# Patient Record
Sex: Female | Born: 1970 | Race: White | Hispanic: No | Marital: Married | Smoking: Never smoker
Health system: Southern US, Community
[De-identification: ages and names within clinical notes are randomized; demographics above are authoritative.]

## PROBLEM LIST (undated history)

## (undated) DIAGNOSIS — Z5189 Encounter for other specified aftercare: Secondary | ICD-10-CM

## (undated) DIAGNOSIS — E119 Type 2 diabetes mellitus without complications: Secondary | ICD-10-CM

## (undated) DIAGNOSIS — D649 Anemia, unspecified: Secondary | ICD-10-CM

## (undated) DIAGNOSIS — I10 Essential (primary) hypertension: Secondary | ICD-10-CM

## (undated) HISTORY — DX: Essential (primary) hypertension: I10

## (undated) HISTORY — DX: Encounter for other specified aftercare: Z51.89

## (undated) HISTORY — DX: Type 2 diabetes mellitus without complications: E11.9

## (undated) HISTORY — DX: Anemia, unspecified: D64.9

---

## 2001-11-12 DIAGNOSIS — E119 Type 2 diabetes mellitus without complications: Secondary | ICD-10-CM

## 2001-11-12 HISTORY — DX: Type 2 diabetes mellitus without complications: E11.9

## 2005-11-12 DIAGNOSIS — Z5189 Encounter for other specified aftercare: Secondary | ICD-10-CM

## 2005-11-12 HISTORY — DX: Encounter for other specified aftercare: Z51.89

## 2005-11-12 HISTORY — PX: LAPAROSCOPIC GASTRIC BYPASS: SUR771

## 2006-11-12 DIAGNOSIS — I10 Essential (primary) hypertension: Secondary | ICD-10-CM

## 2006-11-12 HISTORY — DX: Essential (primary) hypertension: I10

## 2012-03-24 ENCOUNTER — Ambulatory Visit (HOSPITAL_COMMUNITY): Payer: Self-pay

## 2012-08-29 ENCOUNTER — Telehealth (HOSPITAL_COMMUNITY): Payer: Self-pay | Admitting: Oncology

## 2012-09-19 ENCOUNTER — Encounter (HOSPITAL_COMMUNITY): Payer: Self-pay | Admitting: Oncology

## 2012-09-19 ENCOUNTER — Encounter (HOSPITAL_COMMUNITY): Payer: BC Managed Care – PPO | Attending: Oncology | Admitting: Oncology

## 2012-09-19 VITALS — BP 165/79 | HR 91 | Temp 97.3°F | Resp 20 | Ht 70.0 in | Wt 236.6 lb

## 2012-09-19 DIAGNOSIS — E538 Deficiency of other specified B group vitamins: Secondary | ICD-10-CM

## 2012-09-19 DIAGNOSIS — D509 Iron deficiency anemia, unspecified: Secondary | ICD-10-CM | POA: Insufficient documentation

## 2012-09-19 DIAGNOSIS — R5381 Other malaise: Secondary | ICD-10-CM

## 2012-09-19 DIAGNOSIS — Z9884 Bariatric surgery status: Secondary | ICD-10-CM

## 2012-09-19 DIAGNOSIS — E669 Obesity, unspecified: Secondary | ICD-10-CM | POA: Insufficient documentation

## 2012-09-19 LAB — CBC
HCT: 27.6 % — ABNORMAL LOW (ref 36.0–46.0)
Hemoglobin: 8.2 g/dL — ABNORMAL LOW (ref 12.0–15.0)
MCH: 21 pg — ABNORMAL LOW (ref 26.0–34.0)
MCHC: 29.7 g/dL — ABNORMAL LOW (ref 30.0–36.0)
MCV: 70.6 fL — ABNORMAL LOW (ref 78.0–100.0)
Platelets: 317 K/uL (ref 150–400)
RBC: 3.91 MIL/uL (ref 3.87–5.11)
RDW: 16.7 % — ABNORMAL HIGH (ref 11.5–15.5)
WBC: 8.4 K/uL (ref 4.0–10.5)

## 2012-09-19 NOTE — Progress Notes (Signed)
Problem #1 microcytic anemia in the setting of bariatric surgery in 2007 at [facility]. She has been at least iron deficient for year and a half without any success with oral iron both over-the-counter as well as prescription. They have failed to bring up her iron stores and her hemoglobin. She has chronic weakness and fatigue. She has no energy for exercising. Interestingly she does not crave ice nor does she have a sore tongue nor does she have trouble swallowing. She is not aware of blood in her stools etc. She is menstruating on a regular basis. Problem #2 obesity weighing 280 pounds at her maximum. 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 She is married for the second time has 2 sons one by a first marriage 1 by her second marriage. Both are in good health. She also is raising her granddaughter. She does not smoke. She does not drink. Both parents are still alive in good health. BP 165/79  Pulse 91  Temp 97.3 F (36.3 C) (Oral)  Resp 20  Ht 5\' 10"  (1.778 m)  Wt 236 lb 9.6 oz (107.321 kg)  BMI 33.95 kg/m2 She has no lymphadenopathy. She is alert and oriented. Facial symmetry is intact. She is quite pale. Nails are pale. Conjunctiva are pale. Throat is clear. She has an upper partial plate. Pupils are equally round and reactive to light. Lungs are clear. Heart shows a regular rhythm and rate without murmur rub or gallop. Breast exam is negative for masses. Abdomen is obese without obvious organomegaly in the midline incision is intact. 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 She has no leg edema no arm edema. Pulses are intact. This lady has at least severe iron deficiency anemia but I think with her Roux-en-Y procedure she needs to check for copper, B12, and folic acid. She may well have a mixed anemia. What is clear is that she has failed oral iron both conventional over-the-counter and prescription iron.  I think she needs a change of lifestyle  from her eating habits standpoint and I think she needs to get into some exercise program once her hemoglobin improves which I think  will be significantly improved in the near future. We will check her iron stores and CBC one month and 4 months after this iron infusion which will take place next Friday. She is willing to proceed. She has also not had a Pap smear since the birth of her son 16 years ago and we will refer her to Dr. Christin Bach.

## 2012-09-19 NOTE — Progress Notes (Signed)
Honor Norma Carroll presented for labwork. Labs per MD order drawn via Peripheral Line 23 gauge needle inserted in left antecubital.  Good blood return present. Procedure without incident.  Needle removed intact. Patient tolerated procedure well.

## 2012-09-19 NOTE — Patient Instructions (Addendum)
[facility]  Discharge Instructions  RECOMMENDATIONS MADE BY THE CONSULTANT AND ANY TEST RESULTS WILL BE SENT TO YOUR REFERRING DOCTOR.   Lab work today. Feraheme 1020 mg infusion next week. Lab work mid December following iron infusion.  Lab work again in March 2014. MD appointment after lab work in March 2014. We will make a referral to Dr.Ferguson's office for GYN follow-up.   I acknowledge that I have been informed and understand all the instructions given to me and received a copy. I do not have any more questions at this time, but understand that I may call the Specialty Clinic at [facility] at [phone] during business hours should I have any further questions or need assistance in obtaining follow-up care.    __________________________________________  _____________  __________ Signature of Patient or Authorized Representative            Date                   Time    __________________________________________ Nurse's Signature

## 2012-09-20 LAB — IRON AND TIBC: UIBC: 356 ug/dL (ref 125–400)

## 2012-09-20 LAB — FERRITIN: Ferritin: 2 ng/mL — ABNORMAL LOW (ref 10–291)

## 2012-09-22 ENCOUNTER — Other Ambulatory Visit (HOSPITAL_COMMUNITY): Payer: Self-pay | Admitting: Oncology

## 2012-09-22 DIAGNOSIS — E538 Deficiency of other specified B group vitamins: Secondary | ICD-10-CM

## 2012-09-22 LAB — COPPER, SERUM: Copper: 108 ug/dL (ref 70–175)

## 2012-09-22 NOTE — Progress Notes (Signed)
Patient also has Vitamin B12 deficiency due to her Bypass surgery as well

## 2012-09-23 LAB — FOLATE: Folate: >24 ng/mL (ref >5.4)

## 2012-09-26 ENCOUNTER — Encounter (HOSPITAL_BASED_OUTPATIENT_CLINIC_OR_DEPARTMENT_OTHER): Payer: BC Managed Care – PPO

## 2012-09-26 VITALS — BP 133/80 | HR 85 | Temp 98.3°F | Resp 16

## 2012-09-26 DIAGNOSIS — E538 Deficiency of other specified B group vitamins: Secondary | ICD-10-CM

## 2012-09-26 DIAGNOSIS — D509 Iron deficiency anemia, unspecified: Secondary | ICD-10-CM

## 2012-09-26 MED ORDER — SODIUM CHLORIDE 0.9 % IV SOLN
INTRAVENOUS | Status: DC
  Administered 2012-09-26: 14:00:00 via INTRAVENOUS

## 2012-09-26 MED ORDER — CYANOCOBALAMIN 1000 MCG/ML IJ SOLN
1000.0000 ug | Freq: Once | INTRAMUSCULAR | Status: AC
  Administered 2012-09-26: 1000 ug via SUBCUTANEOUS

## 2012-09-26 MED ORDER — SODIUM CHLORIDE 0.9 % IJ SOLN
10.0000 mL | INTRAMUSCULAR | Status: DC | PRN
  Administered 2012-09-26: 10 mL via INTRAVENOUS
  Filled 2012-09-26: qty 10

## 2012-09-26 MED ORDER — CYANOCOBALAMIN 1000 MCG/ML IJ SOLN
INTRAMUSCULAR | Status: AC
  Filled 2012-09-26: qty 1

## 2012-09-26 MED ORDER — FERUMOXYTOL INJECTION 510 MG/17 ML
1020.0000 mg | Freq: Once | INTRAVENOUS | Status: AC
  Administered 2012-09-26: 1020 mg via INTRAVENOUS
  Filled 2012-09-26: qty 34

## 2012-09-26 MED ORDER — SODIUM CHLORIDE 0.9 % IJ SOLN
INTRAMUSCULAR | Status: AC
  Filled 2012-09-26: qty 20

## 2012-09-26 NOTE — Progress Notes (Signed)
Tolerated infusion well. 

## 2012-10-31 ENCOUNTER — Other Ambulatory Visit (HOSPITAL_COMMUNITY): Payer: BC Managed Care – PPO

## 2013-01-30 ENCOUNTER — Other Ambulatory Visit (HOSPITAL_COMMUNITY): Payer: BC Managed Care – PPO

## 2013-02-03 ENCOUNTER — Ambulatory Visit (HOSPITAL_COMMUNITY): Payer: BC Managed Care – PPO | Admitting: Oncology

## 2016-07-16 ENCOUNTER — Emergency Department (HOSPITAL_COMMUNITY): Payer: 59

## 2016-07-16 ENCOUNTER — Inpatient Hospital Stay (HOSPITAL_COMMUNITY)
Admission: EM | Admit: 2016-07-16 | Discharge: 2016-07-19 | DRG: 373 | Disposition: A | Discharge status: Home / Self Care | Payer: 59 | Attending: Internal Medicine | Admitting: Internal Medicine

## 2016-07-16 ENCOUNTER — Encounter (HOSPITAL_COMMUNITY): Payer: Self-pay | Admitting: Emergency Medicine

## 2016-07-16 DIAGNOSIS — Z87891 Personal history of nicotine dependence: Secondary | ICD-10-CM | POA: Diagnosis not present

## 2016-07-16 DIAGNOSIS — N6320 Unspecified lump in the left breast, unspecified quadrant: Secondary | ICD-10-CM

## 2016-07-16 DIAGNOSIS — Z7984 Long term (current) use of oral hypoglycemic drugs: Secondary | ICD-10-CM

## 2016-07-16 DIAGNOSIS — K3532 Acute appendicitis with perforation and localized peritonitis, without abscess: Secondary | ICD-10-CM | POA: Diagnosis present

## 2016-07-16 DIAGNOSIS — N63 Unspecified lump in breast: Secondary | ICD-10-CM | POA: Diagnosis present

## 2016-07-16 DIAGNOSIS — E538 Deficiency of other specified B group vitamins: Secondary | ICD-10-CM | POA: Diagnosis present

## 2016-07-16 DIAGNOSIS — E876 Hypokalemia: Secondary | ICD-10-CM | POA: Diagnosis present

## 2016-07-16 DIAGNOSIS — Z9884 Bariatric surgery status: Secondary | ICD-10-CM

## 2016-07-16 DIAGNOSIS — K352 Acute appendicitis with generalized peritonitis: Secondary | ICD-10-CM | POA: Diagnosis present

## 2016-07-16 DIAGNOSIS — E119 Type 2 diabetes mellitus without complications: Secondary | ICD-10-CM | POA: Diagnosis present

## 2016-07-16 DIAGNOSIS — N920 Excessive and frequent menstruation with regular cycle: Secondary | ICD-10-CM | POA: Diagnosis present

## 2016-07-16 DIAGNOSIS — D500 Iron deficiency anemia secondary to blood loss (chronic): Secondary | ICD-10-CM | POA: Diagnosis present

## 2016-07-16 DIAGNOSIS — Z8249 Family history of ischemic heart disease and other diseases of the circulatory system: Secondary | ICD-10-CM

## 2016-07-16 DIAGNOSIS — K567 Ileus, unspecified: Secondary | ICD-10-CM

## 2016-07-16 DIAGNOSIS — I10 Essential (primary) hypertension: Secondary | ICD-10-CM | POA: Diagnosis present

## 2016-07-16 DIAGNOSIS — E118 Type 2 diabetes mellitus with unspecified complications: Secondary | ICD-10-CM | POA: Diagnosis not present

## 2016-07-16 DIAGNOSIS — Z833 Family history of diabetes mellitus: Secondary | ICD-10-CM | POA: Diagnosis not present

## 2016-07-16 DIAGNOSIS — N649 Disorder of breast, unspecified: Secondary | ICD-10-CM

## 2016-07-16 LAB — COMPREHENSIVE METABOLIC PANEL
ALK PHOS: 61 U/L (ref 38–126)
ALT: 9 U/L — ABNORMAL LOW (ref 14–54)
ANION GAP: 11 (ref 5–15)
AST: 14 U/L — ABNORMAL LOW (ref 15–41)
Albumin: 3.9 g/dL (ref 3.5–5.0)
BILIRUBIN TOTAL: 0.8 mg/dL (ref 0.3–1.2)
BUN: 17 mg/dL (ref 6–20)
CALCIUM: 8.9 mg/dL (ref 8.9–10.3)
CO2: 22 mmol/L (ref 22–32)
Chloride: 103 mmol/L (ref 101–111)
Creatinine, Ser: 0.66 mg/dL (ref 0.44–1.00)
Glucose, Bld: 134 mg/dL — ABNORMAL HIGH (ref 65–99)
POTASSIUM: 3.2 mmol/L — AB (ref 3.5–5.1)
Sodium: 136 mmol/L (ref 135–145)
TOTAL PROTEIN: 7.5 g/dL (ref 6.5–8.1)

## 2016-07-16 LAB — URINALYSIS, ROUTINE W REFLEX MICROSCOPIC
BILIRUBIN URINE: NEGATIVE
Glucose, UA: NEGATIVE mg/dL
Ketones, ur: NEGATIVE mg/dL
Leukocytes, UA: NEGATIVE
NITRITE: POSITIVE — AB
pH: 5 (ref 5.0–8.0)

## 2016-07-16 LAB — CBC
HEMATOCRIT: 25.4 % — AB (ref 36.0–46.0)
HEMOGLOBIN: 7.4 g/dL — AB (ref 12.0–15.0)
MCH: 20.3 pg — ABNORMAL LOW (ref 26.0–34.0)
MCHC: 29.1 g/dL — ABNORMAL LOW (ref 30.0–36.0)
MCV: 69.8 fL — AB (ref 78.0–100.0)
Platelets: 250 10*3/uL (ref 150–400)
RBC: 3.64 MIL/uL — AB (ref 3.87–5.11)
RDW: 16.9 % — ABNORMAL HIGH (ref 11.5–15.5)
WBC: 11.7 10*3/uL — AB (ref 4.0–10.5)

## 2016-07-16 LAB — CBC WITH DIFFERENTIAL/PLATELET
BASOS ABS: 0 10*3/uL (ref 0.0–0.1)
BASOS PCT: 0 %
EOS ABS: 0 10*3/uL (ref 0.0–0.7)
Eosinophils Relative: 0 %
HCT: 28.2 % — ABNORMAL LOW (ref 36.0–46.0)
HEMOGLOBIN: 8.1 g/dL — AB (ref 12.0–15.0)
Lymphocytes Relative: 4 %
Lymphs Abs: 0.7 10*3/uL (ref 0.7–4.0)
MCH: 20 pg — AB (ref 26.0–34.0)
MCHC: 28.7 g/dL — AB (ref 30.0–36.0)
MCV: 69.8 fL — ABNORMAL LOW (ref 78.0–100.0)
Monocytes Absolute: 0.7 10*3/uL (ref 0.1–1.0)
Monocytes Relative: 4 %
NEUTROS ABS: 14.9 10*3/uL — AB (ref 1.7–7.7)
NEUTROS PCT: 92 %
PLATELETS: 300 10*3/uL (ref 150–400)
RBC: 4.04 MIL/uL (ref 3.87–5.11)
RDW: 16.8 % — ABNORMAL HIGH (ref 11.5–15.5)
WBC: 16.3 10*3/uL — ABNORMAL HIGH (ref 4.0–10.5)

## 2016-07-16 LAB — URINE MICROSCOPIC-ADD ON

## 2016-07-16 LAB — CREATININE, SERUM: Creatinine, Ser: 0.7 mg/dL (ref 0.44–1.00)

## 2016-07-16 LAB — LIPASE, BLOOD: LIPASE: 21 U/L (ref 11–51)

## 2016-07-16 LAB — LACTIC ACID, PLASMA
Lactic Acid, Venous: 1.3 mmol/L (ref 0.5–1.9)
Lactic Acid, Venous: 1.2 mmol/L (ref 0.5–1.9)

## 2016-07-16 LAB — TSH: TSH: 0.392 u[IU]/mL (ref 0.350–4.500)

## 2016-07-16 LAB — PREGNANCY, URINE: PREG TEST UR: NEGATIVE

## 2016-07-16 MED ORDER — BISACODYL 10 MG RE SUPP: 10.0000 mg | Freq: Every day | RECTAL | Status: DC | PRN

## 2016-07-16 MED ORDER — SODIUM CHLORIDE 0.9% FLUSH
3.0000 mL | Freq: Two times a day (BID) | INTRAVENOUS | Status: DC
  Administered 2016-07-16: 3 mL via INTRAVENOUS

## 2016-07-16 MED ORDER — MORPHINE SULFATE (PF) 4 MG/ML IV SOLN
4.0000 mg | INTRAVENOUS | Status: AC | PRN
  Administered 2016-07-16 (×2): 4 mg via INTRAVENOUS
  Filled 2016-07-16 (×2): qty 1

## 2016-07-16 MED ORDER — ALBUTEROL SULFATE (2.5 MG/3ML) 0.083% IN NEBU: 2.5000 mg | INHALATION_SOLUTION | RESPIRATORY_TRACT | Status: DC | PRN

## 2016-07-16 MED ORDER — INSULIN ASPART 100 UNIT/ML ~~LOC~~ SOLN: 1.0000 [IU] | Freq: Once | SUBCUTANEOUS | Status: DC

## 2016-07-16 MED ORDER — ACETAMINOPHEN 325 MG PO TABS
650.0000 mg | ORAL_TABLET | Freq: Four times a day (QID) | ORAL | Status: DC | PRN
  Administered 2016-07-16 – 2016-07-17 (×3): 650 mg via ORAL
  Filled 2016-07-16 (×3): qty 2

## 2016-07-16 MED ORDER — TRAZODONE HCL 50 MG PO TABS
50.0000 mg | ORAL_TABLET | Freq: Every evening | ORAL | Status: DC | PRN
  Administered 2016-07-18: 50 mg via ORAL
  Filled 2016-07-16 (×2): qty 1

## 2016-07-16 MED ORDER — HYDRALAZINE HCL 20 MG/ML IJ SOLN: 10.0000 mg | Freq: Four times a day (QID) | INTRAMUSCULAR | Status: DC | PRN

## 2016-07-16 MED ORDER — HEPARIN SODIUM (PORCINE) 5000 UNIT/ML IJ SOLN: 5000.0000 [IU] | Freq: Three times a day (TID) | INTRAMUSCULAR | Status: DC

## 2016-07-16 MED ORDER — MORPHINE SULFATE (PF) 4 MG/ML IV SOLN
4.0000 mg | INTRAVENOUS | Status: DC | PRN
  Administered 2016-07-16 – 2016-07-17 (×3): 4 mg via INTRAVENOUS
  Filled 2016-07-16 (×3): qty 1

## 2016-07-16 MED ORDER — SODIUM CHLORIDE 0.9 % IV BOLUS (SEPSIS)
1000.0000 mL | Freq: Once | INTRAVENOUS | Status: AC
  Administered 2016-07-16: 1000 mL via INTRAVENOUS

## 2016-07-16 MED ORDER — ONDANSETRON HCL 4 MG/2ML IJ SOLN
4.0000 mg | Freq: Four times a day (QID) | INTRAMUSCULAR | Status: DC | PRN
  Administered 2016-07-16 – 2016-07-17 (×2): 4 mg via INTRAVENOUS
  Filled 2016-07-16 (×2): qty 2

## 2016-07-16 MED ORDER — ONDANSETRON HCL 4 MG PO TABS: 4.0000 mg | ORAL_TABLET | Freq: Four times a day (QID) | ORAL | Status: DC | PRN

## 2016-07-16 MED ORDER — PIPERACILLIN-TAZOBACTAM 3.375 G IVPB
3.3750 g | Freq: Three times a day (TID) | INTRAVENOUS | Status: DC
  Administered 2016-07-16 – 2016-07-19 (×8): 3.375 g via INTRAVENOUS
  Filled 2016-07-16 (×7): qty 50

## 2016-07-16 MED ORDER — SODIUM CHLORIDE 0.9% FLUSH: 3.0000 mL | INTRAVENOUS | Status: DC | PRN

## 2016-07-16 MED ORDER — IOPAMIDOL (ISOVUE-300) INJECTION 61%
100.0000 mL | Freq: Once | INTRAVENOUS | Status: AC | PRN
  Administered 2016-07-16: 100 mL via INTRAVENOUS

## 2016-07-16 MED ORDER — SODIUM CHLORIDE 0.9 % IV SOLN
INTRAVENOUS | Status: DC
  Administered 2016-07-16 – 2016-07-17 (×2): via INTRAVENOUS

## 2016-07-16 MED ORDER — ACETAMINOPHEN 650 MG RE SUPP: 650.0000 mg | Freq: Four times a day (QID) | RECTAL | Status: DC | PRN

## 2016-07-16 MED ORDER — FLEET ENEMA 7-19 GM/118ML RE ENEM: 1.0000 | ENEMA | Freq: Once | RECTAL | Status: DC | PRN

## 2016-07-16 MED ORDER — PIPERACILLIN-TAZOBACTAM 3.375 G IVPB 30 MIN
3.3750 g | Freq: Once | INTRAVENOUS | Status: AC
  Administered 2016-07-16: 3.375 g via INTRAVENOUS
  Filled 2016-07-16: qty 50

## 2016-07-16 MED ORDER — SODIUM CHLORIDE 0.9 % IV SOLN: 250.0000 mL | INTRAVENOUS | Status: DC | PRN

## 2016-07-16 MED ORDER — ONDANSETRON HCL 4 MG/2ML IJ SOLN
4.0000 mg | INTRAMUSCULAR | Status: AC | PRN
  Administered 2016-07-16 (×2): 4 mg via INTRAVENOUS
  Filled 2016-07-16 (×2): qty 2

## 2016-07-16 MED ORDER — SODIUM CHLORIDE 0.9 % IV SOLN
INTRAVENOUS | Status: DC
  Administered 2016-07-16: 16:00:00 via INTRAVENOUS

## 2016-07-16 NOTE — ED Notes (Signed)
Dr Clarene Dukemcmanus notified of fever of 102.9 per Dr Clarene DukeMcmanus blood cultures to be obtained. No new orders

## 2016-07-16 NOTE — ED Notes (Signed)
Pt returned from CT °

## 2016-07-16 NOTE — H&P (Signed)
Patient Demographics:    Norma Carroll, is a 45 y.o. female  MRN: [id number]   DOB - 10-04-1971  Admit Date - 07/16/2016  Outpatient Primary MD for the patient is Erasmo Downer, NP   Assessment & Plan:    Principal Problem:   Acute perforated appendicitis Active Problems:   Vitamin B12 deficiency   Diabetes (HCC)   HTN (hypertension)   Breast mass, left/CT abd from 07/16/16    1)Acute Perforated Appendix- Discussed with Dr. Franky Macho the on-call surgeon, he advised IV Zosyn/conservative management for now, official neurosurgical consult pending. Dr. Franky Macho' input appreciated. Patient will be nothing by mouth for now, morphine sulfate when necessary for pain, Zofran when necessary for nausea vomiting. Blood cultures pending.   2)Lt Breast Lesion- patient and her husband notified of incidental finding of left lower breast lesion on CT abdomen and pelvis, advised to have a mammogram as outpatient within the next month. Patient and her husband verbalized understanding, RN Victorino Dike present  3)DM- hold metformin due to contrast study, Allow some permissive Hyperglycemia rather than risk life-threatening hypoglycemia in a patient with unreliable oral intake. Use Novolog/Humalog Sliding scale insulin with Accu-Cheks/Fingersticks as ordered   4)HTN- hold lisinopril onto patient is euvolemic,   may use IV Hydralazine 10 mg  Every 4 hours Prn for systolic blood pressure over 160 mmhg  5)FEN-nothing by mouth until seen by general surgeon, consider NG tube due to ileus, hydrate IV with normal saline  6)Anemia- Hemoglobin is down to 8.1 prior to hydration, Patient with long-standing history of B12 deficiency secondary to gastric bypass, however MCV and MCH have been persistently low suggesting some degree of iron  deficiency anemia ,Patient reports menorrhagia with clots, LMP was 07/09/2016 it was heavy with clots. Advised outpatient pelvic ultrasound  check stool occult blood, transfuse prn , iron studies, B12 and Folic acid level are pending.   7)Sepsis- secondary to #1 above, with leukocytosis, fevers, IV Zosyn as above in #1 pending cultures, hydrate IV, lactic acid level pending  With History of - Reviewed by me  Past Medical History:  Diagnosis Date  . Anemia   . Blood transfusion without reported diagnosis 2007   after gastric bypass surgery  . Diabetes mellitus without complication (HCC) 2003  . Hypertension 2008      Past Surgical History:  Procedure Laterality Date  . LAPAROSCOPIC GASTRIC BYPASS  2007      Chief Complaint  Patient presents with  . Abdominal Pain  . Fever      HPI:    Norma Carroll  is a 45 y.o. female, With past medical history relevant for hypertension, diabetes and previous gastric bypass surgery who presents with abdominal pain that started in the early hours of 07/14/2016, abdominal pain was associated with nausea but no emesis, patient has had chills and fevers, no diarrhea. Fevers up to 102.9 the ED, No sick contacts at home ,no flank pain no  dysuria. In ED patient was found to have perforated appendicitis on the CT scan with leukocytosis with anemia. LMP was 07/09/2016 it was heavy with clots (menorrhagia)    Review of systems:    In addition to the HPI above,   A full 12 point Review of 10 Systems was done, except as stated above, all other Review of 10 Systems were negative.    Social History:  Reviewed by me    Social History  Substance Use Topics  . Smoking status: Former Games developermoker  . Smokeless tobacco: Never Used  . Alcohol use No       Family History :  Reviewed by me    Family History  Problem Relation Age of Onset  . Heart attack Father   . Hypertension Father   . Diabetes Father      Home Medications:   Prior to Admission  medications   Medication Sig Start Date End Date Taking? Authorizing Provider  acetaminophen (TYLENOL) 500 MG tablet Take 500 mg by mouth every 6 (six) hours as needed for mild pain, moderate pain or headache.   Yes Historical Provider, MD  lisinopril (PRINIVIL,ZESTRIL) 20 MG tablet Take 20 mg by mouth daily.   Yes Historical Provider, MD  metFORMIN (GLUCOPHAGE) 500 MG tablet Take 1,000 mg by mouth daily with breakfast. Take two 500 mg tablets every am   Yes Historical Provider, MD     Allergies:    No Known Allergies   Physical Exam:   Vitals  Blood pressure 135/61, pulse 96, temperature (!) 100.7 F (38.2 C), temperature source Oral, resp. rate 18, height 5\' 10"  (1.778 m), weight 88.5 kg (195 lb), last menstrual period 07/09/2016, SpO2 97 %.  Physical Examination: General appearance - alert, well appearing, and in no distress  Mental status - alert, oriented to person, place, and time,  Eyes - sclera anicteric Neck - supple, no JVD elevation , Chest - clear  to auscultation bilaterally, symmetrical air movement,  Heart - S1 and S2 normal,  Abdomen - soft, generalized tenderness with voluntary guarding and slight rebound, nondistended,  Neurological - screening mental status exam normal, neck supple without rigidity, cranial nerves II through XII intact, DTR's normal and symmetric Extremities - no pedal edema noted, intact peripheral pulses  Skin - warm, dry    Data Review:    CBC  Recent Labs Lab 07/16/16 1535  WBC 16.3*  HGB 8.1*  HCT 28.2*  PLT 300  MCV 69.8*  MCH 20.0*  MCHC 28.7*  RDW 16.8*  LYMPHSABS 0.7  MONOABS 0.7  EOSABS 0.0  BASOSABS 0.0   ------------------------------------------------------------------------------------------------------------------  Chemistries   Recent Labs Lab 07/16/16 1535  NA 136  K 3.2*  CL 103  CO2 22  GLUCOSE 134*  BUN 17  CREATININE 0.66  CALCIUM 8.9  AST 14*  ALT 9*  ALKPHOS 61  BILITOT 0.8    ------------------------------------------------------------------------------------------------------------------ estimated creatinine clearance is 107.2 mL/min (by C-G formula based on SCr of 0.8 mg/dL). ------------------------------------------------------------------------------------------------------------------ No results for input(s): TSH, T4TOTAL, T3FREE, THYROIDAB in the last 72 hours.  Invalid input(s): FREET3   Coagulation profile No results for input(s): INR, PROTIME in the last 168 hours. ------------------------------------------------------------------------------------------------------------------- No results for input(s): DDIMER in the last 72 hours. -------------------------------------------------------------------------------------------------------------------  Cardiac Enzymes No results for input(s): CKMB, TROPONINI, MYOGLOBIN in the last 168 hours.  Invalid input(s): CK ------------------------------------------------------------------------------------------------------------------ No results found for: BNP   ---------------------------------------------------------------------------------------------------------------  Urinalysis    Component Value Date/Time   COLORURINE YELLOW 07/16/2016 1510  APPEARANCEUR CLEAR 07/16/2016 1510   LABSPEC >1.030 (H) 07/16/2016 1510   PHURINE 5.0 07/16/2016 1510   GLUCOSEU NEGATIVE 07/16/2016 1510   HGBUR TRACE (A) 07/16/2016 1510   BILIRUBINUR NEGATIVE 07/16/2016 1510   KETONESUR NEGATIVE 07/16/2016 1510   PROTEINUR TRACE (A) 07/16/2016 1510   NITRITE POSITIVE (A) 07/16/2016 1510   LEUKOCYTESUR NEGATIVE 07/16/2016 1510    ----------------------------------------------------------------------------------------------------------------   Imaging Results:    Dg Chest 2 View  Result Date: 07/16/2016 CLINICAL DATA:  Abdominal pain and chills. EXAM: CHEST  2 VIEW COMPARISON:  None. FINDINGS: The heart size and  mediastinal contours are within normal limits. Both lungs are clear. The visualized skeletal structures are unremarkable. IMPRESSION: No active cardiopulmonary disease. Electronically Signed   By: Gerome Sam III M.D   On: 07/16/2016 16:23   Ct Abdomen Pelvis W Contrast  Result Date: 07/16/2016 CLINICAL DATA:  Diffuse abdominal pain and loose stool since Saturday. EXAM: CT ABDOMEN AND PELVIS WITH CONTRAST TECHNIQUE: Multidetector CT imaging of the abdomen and pelvis was performed using the standard protocol following bolus administration of intravenous contrast. CONTRAST:  ISOVUE-300 IOPAMIDOL (ISOVUE-300) INJECTION 61% COMPARISON:  None. FINDINGS: Lower chest: The lung bases are clear. No pulmonary nodules or pleural effusion. The heart is normal in size. No pericardial effusion. The distal esophagus is grossly normal. Hepatobiliary: No focal hepatic lesions or intrahepatic biliary dilatation. Mild gallbladder distention. No gallbladder wall thickening or pericholecystic inflammatory change to suggest acute cholecystitis. No common bile duct dilatation. Pancreas: No mass, inflammation or ductal dilatation. Spleen: Normal size.  No focal lesions. Adrenals/Urinary Tract: The adrenal glands and kidneys are unremarkable except for small lower pole calculi. No obstructing ureteral calculi or bladder calculi. Stomach/Bowel: Surgical changes from gastric bypass surgery. No complicating features are demonstrated. The small bowel is unremarkable. The terminal ileum is normal. There is moderate fluid in the right colon. The left colon is relatively decompressed. The appendix is markedly distended with wall thickening and periappendiceal inflammatory change. Extra luminal gas is seen. The tip of the appendix is normal. Findings most consistent with contained perforated appendicitis. Vascular/Lymphatic: No mesenteric or retroperitoneal mass or adenopathy. The aorta and branch vessels are patent. The major venous  structures are patent. Reproductive: The uterus and ovaries are unremarkable. Other: Small to moderate amount of free pelvic fluid. Musculoskeletal: No significant bony findings. IMPRESSION: 1. CT findings consistent with perforated appendicitis. Probable associated mild ileus. 2. Status post gastric bypass surgery. No complicating features are demonstrated. 3. Small bilateral renal calculi but no obstructing ureteral calculi. 4. Possible left lower breast lesion. **An incidental finding of potential clinical significance has been found. Recommend correlation with physical examination and any prior recent mammograms or ultrasound. Patient may need a diagnostic breast imaging workup.** Electronically Signed   By: Rudie Meyer M.D.   On: 07/16/2016 16:57    Radiological Exams on Admission: Dg Chest 2 View  Result Date: 07/16/2016 CLINICAL DATA:  Abdominal pain and chills. EXAM: CHEST  2 VIEW COMPARISON:  None. FINDINGS: The heart size and mediastinal contours are within normal limits. Both lungs are clear. The visualized skeletal structures are unremarkable. IMPRESSION: No active cardiopulmonary disease. Electronically Signed   By: Gerome Sam III M.D   On: 07/16/2016 16:23   Ct Abdomen Pelvis W Contrast  Result Date: 07/16/2016 CLINICAL DATA:  Diffuse abdominal pain and loose stool since Saturday. EXAM: CT ABDOMEN AND PELVIS WITH CONTRAST TECHNIQUE: Multidetector CT imaging of the abdomen and pelvis was performed using the standard  protocol following bolus administration of intravenous contrast. CONTRAST:  ISOVUE-300 IOPAMIDOL (ISOVUE-300) INJECTION 61% COMPARISON:  None. FINDINGS: Lower chest: The lung bases are clear. No pulmonary nodules or pleural effusion. The heart is normal in size. No pericardial effusion. The distal esophagus is grossly normal. Hepatobiliary: No focal hepatic lesions or intrahepatic biliary dilatation. Mild gallbladder distention. No gallbladder wall thickening or  pericholecystic inflammatory change to suggest acute cholecystitis. No common bile duct dilatation. Pancreas: No mass, inflammation or ductal dilatation. Spleen: Normal size.  No focal lesions. Adrenals/Urinary Tract: The adrenal glands and kidneys are unremarkable except for small lower pole calculi. No obstructing ureteral calculi or bladder calculi. Stomach/Bowel: Surgical changes from gastric bypass surgery. No complicating features are demonstrated. The small bowel is unremarkable. The terminal ileum is normal. There is moderate fluid in the right colon. The left colon is relatively decompressed. The appendix is markedly distended with wall thickening and periappendiceal inflammatory change. Extra luminal gas is seen. The tip of the appendix is normal. Findings most consistent with contained perforated appendicitis. Vascular/Lymphatic: No mesenteric or retroperitoneal mass or adenopathy. The aorta and branch vessels are patent. The major venous structures are patent. Reproductive: The uterus and ovaries are unremarkable. Other: Small to moderate amount of free pelvic fluid. Musculoskeletal: No significant bony findings. IMPRESSION: 1. CT findings consistent with perforated appendicitis. Probable associated mild ileus. 2. Status post gastric bypass surgery. No complicating features are demonstrated. 3. Small bilateral renal calculi but no obstructing ureteral calculi. 4. Possible left lower breast lesion. **An incidental finding of potential clinical significance has been found. Recommend correlation with physical examination and any prior recent mammograms or ultrasound. Patient may need a diagnostic breast imaging workup.** Electronically Signed   By: Rudie Meyer M.D.   On: 07/16/2016 16:57    DVT Prophylaxis -SCD   AM Labs Ordered, also please review Full Orders  Family Communication: Admission, patients condition and plan of care including tests being ordered have been discussed with the patient and  husband who indicate understanding and agree with the plan   Code Status - Full Code  Likely DC to  Home  Condition   fair  Amberlin Utke M.D on 07/16/2016 at 7:49 PM   Between 7am to 7pm - Pager - [phone]  After 7pm go to [email or web address] - password [email or web address]  [facility] - Office  [phone]  Dragon dictation system was used to create this note, attempts have been made to correct errors, however presence of uncorrected errors is not a reflection quality of care provided.

## 2016-07-16 NOTE — Progress Notes (Addendum)
Pharmacy Antibiotic Note  Norma Carroll is a 45 y.o. female admitted on 07/16/2016 with sepsis/perforated appendicitis.  Pharmacy has been consulted for Zosyn dosing.  Plan: Zosyn 3.375g IV q8h (4 hour infusion). F/U cultures Monitor V/S, labs and clinical course  Height: 5\' 10"  (177.8 cm) Weight: 195 lb (88.5 kg) IBW/kg (Calculated) : 68.5  Temp (24hrs), Avg:99.1 F (37.3 C), Min:99.1 F (37.3 C), Max:99.1 F (37.3 C)   Recent Labs Lab 07/16/16 1535  WBC 16.3*  CREATININE 0.66    Estimated Creatinine Clearance: 107.2 mL/min (by C-G formula based on SCr of 0.8 mg/dL).    No Known Allergies  Antimicrobials this admission: Zosyn 9/4 >>   Dose adjustments this admission: n/a  Microbiology results: 9/4 BJY:[id number]:pending  Thank you for allowing pharmacy to be a part of this patient's care.  Elder CyphersLorie Yonatan Guitron, BS Loura Backharm D, [place]PS Clinical Pharmacist Pager [phone]  07/16/2016 6:44 PM

## 2016-07-16 NOTE — Progress Notes (Signed)
Patient has been talked to in regards to getting an outpatient mammogram to check lump on left breast. Patient acknowledges and understands this.

## 2016-07-16 NOTE — ED Triage Notes (Signed)
Reports diffuse abd pain with chills and loose stools since Saturday.  Tylenol last taken last night.  Denies vomiting.

## 2016-07-16 NOTE — ED Notes (Signed)
Patient transported to CT 

## 2016-07-16 NOTE — ED Provider Notes (Signed)
[facility]Y DEPT Provider Note   CSN: [id number] Arrival date & time: 07/16/16  1450     History   Chief Complaint Chief Complaint  Patient presents with  . Abdominal Pain  . Fever    HPI Norma Carroll is a 45 y.o. female.  HPI  Pt was seen at 1505.  Per pt, c/o gradual onset and persistence of constant generalized abd "pain" for the past 2 days.  Has been associated with multiple intermittent episodes of diarrhea and "chills."  Describes the abd pain as "aching."  Denies N/V, no objective fevers, no back pain, no rash, no CP/SOB, no black or blood in stools.      Past Medical History:  Diagnosis Date  . Anemia   . Blood transfusion without reported diagnosis 2007   after gastric bypass surgery  . Diabetes mellitus without complication (HCC) 2003  . Hypertension 2008    Patient Active Problem List   Diagnosis Date Noted  . Vitamin B12 deficiency 09/22/2012    Past Surgical History:  Procedure Laterality Date  . LAPAROSCOPIC GASTRIC BYPASS  2007    OB History    No data available       Home Medications    Prior to Admission medications   Medication Sig Start Date End Date Taking? Authorizing Provider  lisinopril (PRINIVIL,ZESTRIL) 20 MG tablet Take 20 mg by mouth daily.    Historical Provider, MD  metFORMIN (GLUCOPHAGE) 500 MG tablet Take 1,000 mg by mouth daily with breakfast. Take two 500 mg tablets every am    Historical Provider, MD    Family History Family History  Problem Relation Age of Onset  . Heart attack Father   . Hypertension Father   . Diabetes Father     Social History Social History  Substance Use Topics  . Smoking status: Former Games developer  . Smokeless tobacco: Never Used  . Alcohol use No     Allergies   Review of patient's allergies indicates no known allergies.   Review of Systems Review of Systems ROS: Statement: All systems negative except as marked or noted in the HPI; Constitutional: Negative for objective  fever and +chills. ; ; Eyes: Negative for eye pain, redness and discharge. ; ; ENMT: Negative for ear pain, hoarseness, nasal congestion, sinus pressure and sore throat. ; ; Cardiovascular: Negative for chest pain, palpitations, diaphoresis, dyspnea and peripheral edema. ; ; Respiratory: Negative for cough, wheezing and stridor. ; ; Gastrointestinal: +abd pain, diarrhea. Negative for nausea, vomiting, blood in stool, hematemesis, jaundice and rectal bleeding. . ; ; Genitourinary: Negative for dysuria, flank pain and hematuria. ; ; Musculoskeletal: Negative for back pain and neck pain. Negative for swelling and trauma.; ; Skin: Negative for pruritus, rash, abrasions, blisters, bruising and skin lesion.; ; Neuro: Negative for headache, lightheadedness and neck stiffness. Negative for weakness, altered level of consciousness, altered mental status, extremity weakness, paresthesias, involuntary movement, seizure and syncope.       Physical Exam Updated Vital Signs BP 135/65 (BP Location: Left Arm)   Pulse 98   Temp 99.1 F (37.3 C) (Oral)   Resp 18   Ht 5\' 10"  (1.778 m)   Wt 195 lb (88.5 kg)   LMP 07/09/2016   SpO2 99%   BMI 27.98 kg/m   Physical Exam 1510: Physical examination:  Nursing notes reviewed; Vital signs and O2 SAT reviewed;  Constitutional: Well developed, Well nourished, Well hydrated, Uncomfortable appearing.; Head:  Normocephalic, atraumatic; Eyes: EOMI, PERRL, No scleral icterus; ENMT:  Mouth and pharynx normal, Mucous membranes moist; Neck: Supple, Full range of motion, No lymphadenopathy; Cardiovascular: Regular rate and rhythm, No murmur, rub, or gallop; Respiratory: Breath sounds clear & equal bilaterally, No rales, rhonchi, wheezes.  Speaking full sentences with ease, Normal respiratory effort/excursion; Chest: Nontender, Movement normal; Abdomen: Soft, +diffuse tenderness to palp. No rebound or guarding. Nondistended, Increased bowel sounds; Genitourinary: No CVA tenderness;  Extremities: Pulses normal, No tenderness, No edema, No calf edema or asymmetry.; Neuro: AA&Ox3, Major CN grossly intact.  Speech clear. No gross focal motor or sensory deficits in extremities.; Skin: Color normal, Warm, Dry.   ED Treatments / Results  Labs (all labs ordered are listed, but only abnormal results are displayed)   EKG  EKG Interpretation None       Radiology   Procedures Procedures (including critical care time)  Medications Ordered in ED Medications  0.9 %  sodium chloride infusion ( Intravenous New Bag/Given 07/16/16 1533)  morphine 4 MG/ML injection 4 mg (not administered)  ondansetron (ZOFRAN) injection 4 mg (not administered)     Initial Impression / Assessment and Plan / ED Course  I have reviewed the triage vital signs and the nursing notes.  Pertinent labs & imaging results that were available during my care of the patient were reviewed by me and considered in my medical decision making (see chart for details).  MDM Reviewed: previous chart, nursing note and vitals Reviewed previous: labs Interpretation: labs, CT scan and x-ray    Results for orders placed or performed during the hospital encounter of 07/16/16  Comprehensive metabolic panel  Result Value Ref Range   Sodium 136 135 - 145 mmol/L   Potassium 3.2 (L) 3.5 - 5.1 mmol/L   Chloride 103 101 - 111 mmol/L   CO2 22 22 - 32 mmol/L   Glucose, Bld 134 (H) 65 - 99 mg/dL   BUN 17 6 - 20 mg/dL   Creatinine, Ser 1.610.66 0.44 - 1.00 mg/dL   Calcium 8.9 8.9 - 09.610.3 mg/dL   Total Protein 7.5 6.5 - 8.1 g/dL   Albumin 3.9 3.5 - 5.0 g/dL   AST 14 (L) 15 - 41 U/L   ALT 9 (L) 14 - 54 U/L   Alkaline Phosphatase 61 38 - 126 U/L   Total Bilirubin 0.8 0.3 - 1.2 mg/dL   GFR calc non Af Amer >60 >60 mL/min   GFR calc Af Amer >60 >60 mL/min   Anion gap 11 5 - 15  Lipase, blood  Result Value Ref Range   Lipase 21 11 - 51 U/L  CBC with Differential  Result Value Ref Range   WBC 16.3 (H) 4.0 - 10.5 K/uL     RBC 4.04 3.87 - 5.11 MIL/uL   Hemoglobin 8.1 (L) 12.0 - 15.0 g/dL   HCT 04.528.2 (L) 40.936.0 - 81.146.0 %   MCV 69.8 (L) 78.0 - 100.0 fL   MCH 20.0 (L) 26.0 - 34.0 pg   MCHC 28.7 (L) 30.0 - 36.0 g/dL   RDW 91.416.8 (H) 78.211.5 - 95.615.5 %   Platelets 300 150 - 400 K/uL   Neutrophils Relative % 92 %   Neutro Abs 14.9 (H) 1.7 - 7.7 K/uL   Lymphocytes Relative 4 %   Lymphs Abs 0.7 0.7 - 4.0 K/uL   Monocytes Relative 4 %   Monocytes Absolute 0.7 0.1 - 1.0 K/uL   Eosinophils Relative 0 %   Eosinophils Absolute 0.0 0.0 - 0.7 K/uL   Basophils Relative 0 %  Basophils Absolute 0.0 0.0 - 0.1 K/uL   RBC Morphology ELLIPTOCYTES   Urinalysis, Routine w reflex microscopic  Result Value Ref Range   Color, Urine YELLOW YELLOW   APPearance CLEAR CLEAR   Specific Gravity, Urine >1.030 (H) 1.005 - 1.030   pH 5.0 5.0 - 8.0   Glucose, UA NEGATIVE NEGATIVE mg/dL   Hgb urine dipstick TRACE (A) NEGATIVE   Bilirubin Urine NEGATIVE NEGATIVE   Ketones, ur NEGATIVE NEGATIVE mg/dL   Protein, ur TRACE (A) NEGATIVE mg/dL   Nitrite POSITIVE (A) NEGATIVE   Leukocytes, UA NEGATIVE NEGATIVE  Pregnancy, urine  Result Value Ref Range   Preg Test, Ur NEGATIVE NEGATIVE  Urine microscopic-add on  Result Value Ref Range   Squamous Epithelial / LPF 6-30 (A) NONE SEEN   WBC, UA 0-5 0 - 5 WBC/hpf   RBC / HPF 0-5 0 - 5 RBC/hpf   Bacteria, UA MANY (A) NONE SEEN   Urine-Other MUCOUS PRESENT    Dg Chest 2 View Result Date: 07/16/2016 CLINICAL DATA:  Abdominal pain and chills. EXAM: CHEST  2 VIEW COMPARISON:  None. FINDINGS: The heart size and mediastinal contours are within normal limits. Both lungs are clear. The visualized skeletal structures are unremarkable. IMPRESSION: No active cardiopulmonary disease. Electronically Signed   By: Gerome Sam III M.D   On: 07/16/2016 16:23   Ct Abdomen Pelvis W Contrast Result Date: 07/16/2016 CLINICAL DATA:  Diffuse abdominal pain and loose stool since Saturday. EXAM: CT ABDOMEN AND PELVIS  WITH CONTRAST TECHNIQUE: Multidetector CT imaging of the abdomen and pelvis was performed using the standard protocol following bolus administration of intravenous contrast. CONTRAST:  ISOVUE-300 IOPAMIDOL (ISOVUE-300) INJECTION 61% COMPARISON:  None. FINDINGS: Lower chest: The lung bases are clear. No pulmonary nodules or pleural effusion. The heart is normal in size. No pericardial effusion. The distal esophagus is grossly normal. Hepatobiliary: No focal hepatic lesions or intrahepatic biliary dilatation. Mild gallbladder distention. No gallbladder wall thickening or pericholecystic inflammatory change to suggest acute cholecystitis. No common bile duct dilatation. Pancreas: No mass, inflammation or ductal dilatation. Spleen: Normal size.  No focal lesions. Adrenals/Urinary Tract: The adrenal glands and kidneys are unremarkable except for small lower pole calculi. No obstructing ureteral calculi or bladder calculi. Stomach/Bowel: Surgical changes from gastric bypass surgery. No complicating features are demonstrated. The small bowel is unremarkable. The terminal ileum is normal. There is moderate fluid in the right colon. The left colon is relatively decompressed. The appendix is markedly distended with wall thickening and periappendiceal inflammatory change. Extra luminal gas is seen. The tip of the appendix is normal. Findings most consistent with contained perforated appendicitis. Vascular/Lymphatic: No mesenteric or retroperitoneal mass or adenopathy. The aorta and branch vessels are patent. The major venous structures are patent. Reproductive: The uterus and ovaries are unremarkable. Other: Small to moderate amount of free pelvic fluid. Musculoskeletal: No significant bony findings. IMPRESSION: 1. CT findings consistent with perforated appendicitis. Probable associated mild ileus. 2. Status post gastric bypass surgery. No complicating features are demonstrated. 3. Small bilateral renal calculi but no  obstructing ureteral calculi. 4. Possible left lower breast lesion. **An incidental finding of potential clinical significance has been found. Recommend correlation with physical examination and any prior recent mammograms or ultrasound. Patient may need a diagnostic breast imaging workup.** Electronically Signed   By: Rudie Meyer M.D.   On: 07/16/2016 16:57    1710:  CT as above. IV abx started. Dx and testing d/w pt and family.  Questions  answered.  Verb understanding, agreeable to admit. T/C to General Surgery Dr. Lovell Sheehan, case discussed, including:  HPI, pertinent PM/SHx, VS/PE, dx testing, ED course and treatment:  Agreeable to consult, requests to admit to medicine service for IV abx, then PO abx x2 weeks (no OR). T/C to [facility] Dr. Mariea Clonts, case discussed, including:  HPI, pertinent PM/SHx, VS/PE, dx testing, ED course and treatment:  Agreeable to speak with Dr. Lovell Sheehan regarding admission.    Final Clinical Impressions(s) / ED Diagnoses   Final diagnoses:  None    New Prescriptions New Prescriptions   No medications on file     Samuel Jester, DO 07/18/16 1750

## 2016-07-17 LAB — VITAMIN B12: VITAMIN B 12: 91 pg/mL — AB (ref 180–914)

## 2016-07-17 LAB — CBC
HEMATOCRIT: 23.9 % — AB (ref 36.0–46.0)
Hemoglobin: 7 g/dL — ABNORMAL LOW (ref 12.0–15.0)
MCH: 20.1 pg — ABNORMAL LOW (ref 26.0–34.0)
MCHC: 28 g/dL — ABNORMAL LOW (ref 30.0–36.0)
MCV: 71.6 fL — AB (ref 78.0–100.0)
Platelets: 220 10*3/uL (ref 150–400)
RBC: 3.34 MIL/uL — ABNORMAL LOW (ref 3.87–5.11)
RDW: 16.9 % — AB (ref 11.5–15.5)
WBC: 11.1 10*3/uL — AB (ref 4.0–10.5)

## 2016-07-17 LAB — IRON AND TIBC
IRON: 8 ug/dL — AB (ref 28–170)
Saturation Ratios: 2 % — ABNORMAL LOW (ref 10.4–31.8)
TIBC: 335 ug/dL (ref 250–450)
UIBC: 327 ug/dL

## 2016-07-17 LAB — BASIC METABOLIC PANEL
Anion gap: 6 (ref 5–15)
BUN: 14 mg/dL (ref 6–20)
CHLORIDE: 106 mmol/L (ref 101–111)
CO2: 22 mmol/L (ref 22–32)
Calcium: 7.8 mg/dL — ABNORMAL LOW (ref 8.9–10.3)
Creatinine, Ser: 0.66 mg/dL (ref 0.44–1.00)
GFR calc Af Amer: >60 mL/min (ref >60)
GFR calc non Af Amer: >60 mL/min (ref >60)
Glucose, Bld: 148 mg/dL — ABNORMAL HIGH (ref 65–99)
POTASSIUM: 3.3 mmol/L — AB (ref 3.5–5.1)
SODIUM: 134 mmol/L — AB (ref 135–145)

## 2016-07-17 LAB — FERRITIN: FERRITIN: 27 ng/mL (ref 11–307)

## 2016-07-17 LAB — GLUCOSE, CAPILLARY
Glucose-Capillary: 147 mg/dL — ABNORMAL HIGH (ref 65–99)
Glucose-Capillary: 112 mg/dL — ABNORMAL HIGH (ref 65–99)
Glucose-Capillary: 132 mg/dL — ABNORMAL HIGH (ref 65–99)
Glucose-Capillary: 134 mg/dL — ABNORMAL HIGH (ref 65–99)

## 2016-07-17 LAB — PREPARE RBC (CROSSMATCH)

## 2016-07-17 LAB — ABO/RH: ABO/RH(D): A POS

## 2016-07-17 MED ORDER — CYANOCOBALAMIN 1000 MCG/ML IJ SOLN
1000.0000 ug | Freq: Once | INTRAMUSCULAR | Status: AC
Start: 2016-07-17 — End: 2016-07-17
  Administered 2016-07-17: 1000 ug via INTRAMUSCULAR
  Filled 2016-07-17: qty 1

## 2016-07-17 MED ORDER — ACETAMINOPHEN 500 MG PO TABS
1000.0000 mg | ORAL_TABLET | Freq: Once | ORAL | Status: AC
  Administered 2016-07-17: 1000 mg via ORAL
  Filled 2016-07-17: qty 2

## 2016-07-17 MED ORDER — ENOXAPARIN SODIUM 40 MG/0.4ML ~~LOC~~ SOLN
40.0000 mg | SUBCUTANEOUS | Status: DC
  Administered 2016-07-17 – 2016-07-18 (×2): 40 mg via SUBCUTANEOUS
  Filled 2016-07-17 (×2): qty 0.4

## 2016-07-17 MED ORDER — HYDROMORPHONE HCL 1 MG/ML IJ SOLN
1.0000 mg | INTRAMUSCULAR | Status: DC | PRN
  Administered 2016-07-17 – 2016-07-19 (×9): 1 mg via INTRAVENOUS
  Filled 2016-07-17 (×10): qty 1

## 2016-07-17 MED ORDER — SIMETHICONE 80 MG PO CHEW
80.0000 mg | CHEWABLE_TABLET | Freq: Four times a day (QID) | ORAL | Status: DC | PRN
  Administered 2016-07-17: 80 mg via ORAL
  Filled 2016-07-17: qty 1

## 2016-07-17 MED ORDER — SODIUM CHLORIDE 0.9 % IV SOLN
Freq: Once | INTRAVENOUS | Status: AC
  Administered 2016-07-17: 10:00:00 via INTRAVENOUS

## 2016-07-17 NOTE — Progress Notes (Signed)
Before giving blood, patients temperature 103.1 orally.  Blood already obtained from blood bank.  Temperature reported to MD, orders to hold blood at this time and wait for fever to go down.  Blood returned to blood bank.  Tylenol given to patient.  Will continue to monitor.

## 2016-07-17 NOTE — Consult Note (Addendum)
Reason for Consult: Perforated appendicitis Referring Physician: Dr. Sherian Rein Norma Carroll is an 45 y.o. female.  HPI: Patient is a 45 year old white female who was in her usual state of health until 48 hours ago when she began experiencing lower abdominal pain. It started spontaneously. It did not radiate. It worsened. She was unable to get relief of the pain. She presented to the emergency room and was found on CT scan the abdomen to have perforated acute appendicitis which was contained. The patient states that she does have a decreased appetite. Her last bowel movement was over 4 days ago. She has not been passing flatus. She does have some mild nausea, but no emesis. She has never had an episode of pain like this before.  Past Medical History:  Diagnosis Date  . Anemia   . Blood transfusion without reported diagnosis 2007   after gastric bypass surgery  . Diabetes mellitus without complication ([place]) 4196  . Hypertension 2008    Past Surgical History:  Procedure Laterality Date  . LAPAROSCOPIC GASTRIC BYPASS  2007    Family History  Problem Relation Age of Onset  . Heart attack Father   . Hypertension Father   . Diabetes Father     Social History:  reports that she has quit smoking. She has never used smokeless tobacco. She reports that she does not drink alcohol or use drugs.  Allergies: No Known Allergies  Medications:  Scheduled: . insulin aspart  1 Units Subcutaneous Once  . piperacillin-tazobactam (ZOSYN)  IV  3.375 g Intravenous Q8H  . sodium chloride flush  3 mL Intravenous Q12H   Continuous: . sodium chloride 125 mL/hr at 07/16/16 1533  . sodium chloride 150 mL/hr at 07/17/16 0548    Results for orders placed or performed during the hospital encounter of 07/16/16 (from the past 48 hour(s))  Urinalysis, Routine w reflex microscopic     Status: Abnormal   Collection Time: 07/16/16  3:10 PM  Result Value Ref Range   Color, Urine YELLOW YELLOW   APPearance  CLEAR CLEAR   Specific Gravity, Urine >1.030 (H) 1.005 - 1.030   pH 5.0 5.0 - 8.0   Glucose, UA NEGATIVE NEGATIVE mg/dL   Hgb urine dipstick TRACE (A) NEGATIVE   Bilirubin Urine NEGATIVE NEGATIVE   Ketones, ur NEGATIVE NEGATIVE mg/dL   Protein, ur TRACE (A) NEGATIVE mg/dL   Nitrite POSITIVE (A) NEGATIVE   Leukocytes, UA NEGATIVE NEGATIVE  Pregnancy, urine     Status: None   Collection Time: 07/16/16  3:10 PM  Result Value Ref Range   Preg Test, Ur NEGATIVE NEGATIVE    Comment:        THE SENSITIVITY OF THIS METHODOLOGY IS >20 mIU/mL.   Urine microscopic-add on     Status: Abnormal   Collection Time: 07/16/16  3:10 PM  Result Value Ref Range   Squamous Epithelial / LPF 6-30 (A) NONE SEEN   WBC, UA 0-5 0 - 5 WBC/hpf   RBC / HPF 0-5 0 - 5 RBC/hpf   Bacteria, UA MANY (A) NONE SEEN   Urine-Other MUCOUS PRESENT   Comprehensive metabolic panel     Status: Abnormal   Collection Time: 07/16/16  3:35 PM  Result Value Ref Range   Sodium 136 135 - 145 mmol/L   Potassium 3.2 (L) 3.5 - 5.1 mmol/L   Chloride 103 101 - 111 mmol/L   CO2 22 22 - 32 mmol/L   Glucose, Bld 134 (H) 65 - 99  mg/dL   BUN 17 6 - 20 mg/dL   Creatinine, Ser 0.66 0.44 - 1.00 mg/dL   Calcium 8.9 8.9 - 10.3 mg/dL   Total Protein 7.5 6.5 - 8.1 g/dL   Albumin 3.9 3.5 - 5.0 g/dL   AST 14 (L) 15 - 41 U/L   ALT 9 (L) 14 - 54 U/L   Alkaline Phosphatase 61 38 - 126 U/L   Total Bilirubin 0.8 0.3 - 1.2 mg/dL   GFR calc non Af Amer >60 >60 mL/min   GFR calc Af Amer >60 >60 mL/min    Comment: (NOTE) The eGFR has been calculated using the CKD EPI equation. This calculation has not been validated in all clinical situations. eGFR's persistently <60 mL/min signify possible Chronic Kidney Disease.    Anion gap 11 5 - 15  Lipase, blood     Status: None   Collection Time: 07/16/16  3:35 PM  Result Value Ref Range   Lipase 21 11 - 51 U/L  CBC with Differential     Status: Abnormal   Collection Time: 07/16/16  3:35 PM  Result  Value Ref Range   WBC 16.3 (H) 4.0 - 10.5 K/uL   RBC 4.04 3.87 - 5.11 MIL/uL   Hemoglobin 8.1 (L) 12.0 - 15.0 g/dL   HCT 28.2 (L) 36.0 - 46.0 %   MCV 69.8 (L) 78.0 - 100.0 fL   MCH 20.0 (L) 26.0 - 34.0 pg   MCHC 28.7 (L) 30.0 - 36.0 g/dL   RDW 16.8 (H) 11.5 - 15.5 %   Platelets 300 150 - 400 K/uL   Neutrophils Relative % 92 %   Neutro Abs 14.9 (H) 1.7 - 7.7 K/uL   Lymphocytes Relative 4 %   Lymphs Abs 0.7 0.7 - 4.0 K/uL   Monocytes Relative 4 %   Monocytes Absolute 0.7 0.1 - 1.0 K/uL   Eosinophils Relative 0 %   Eosinophils Absolute 0.0 0.0 - 0.7 K/uL   Basophils Relative 0 %   Basophils Absolute 0.0 0.0 - 0.1 K/uL   RBC Morphology ELLIPTOCYTES   Culture, blood (Routine X 2) w Reflex to ID Panel     Status: None (Preliminary result)   Collection Time: 07/16/16  8:30 PM  Result Value Ref Range   Specimen Description LEFT ANTECUBITAL    Special Requests BOTTLES DRAWN AEROBIC AND ANAEROBIC 6CC    Culture PENDING    Report Status PENDING   Culture, blood (Routine X 2) w Reflex to ID Panel     Status: None (Preliminary result)   Collection Time: 07/16/16  8:30 PM  Result Value Ref Range   Specimen Description BLOOD LEFT HAND    Special Requests BOTTLES DRAWN AEROBIC ONLY 6CC    Culture PENDING    Report Status PENDING   CBC     Status: Abnormal   Collection Time: 07/16/16  8:30 PM  Result Value Ref Range   WBC 11.7 (H) 4.0 - 10.5 K/uL   RBC 3.64 (L) 3.87 - 5.11 MIL/uL   Hemoglobin 7.4 (L) 12.0 - 15.0 g/dL   HCT 25.4 (L) 36.0 - 46.0 %   MCV 69.8 (L) 78.0 - 100.0 fL   MCH 20.3 (L) 26.0 - 34.0 pg   MCHC 29.1 (L) 30.0 - 36.0 g/dL   RDW 16.9 (H) 11.5 - 15.5 %   Platelets 250 150 - 400 K/uL  Creatinine, serum     Status: None   Collection Time: 07/16/16  8:30 PM  Result Value  Ref Range   Creatinine, Ser 0.70 0.44 - 1.00 mg/dL   GFR calc non Af Amer >60 >60 mL/min   GFR calc Af Amer >60 >60 mL/min    Comment: (NOTE) The eGFR has been calculated using the CKD EPI  equation. This calculation has not been validated in all clinical situations. eGFR's persistently <60 mL/min signify possible Chronic Kidney Disease.   Type and screen [facility]     Status: None   Collection Time: 07/16/16  8:30 PM  Result Value Ref Range   ABO/RH(D) A POS    Antibody Screen NEG    Sample Expiration 07/19/2016   Lactic acid, plasma     Status: None   Collection Time: 07/16/16  8:30 PM  Result Value Ref Range   Lactic Acid, Venous 1.3 0.5 - 1.9 mmol/L  TSH     Status: None   Collection Time: 07/16/16  8:41 PM  Result Value Ref Range   TSH 0.392 0.350 - 4.500 uIU/mL  Lactic acid, plasma     Status: None   Collection Time: 07/16/16 10:46 PM  Result Value Ref Range   Lactic Acid, Venous 1.2 0.5 - 1.9 mmol/L  Basic metabolic panel     Status: Abnormal   Collection Time: 07/17/16  5:48 AM  Result Value Ref Range   Sodium 134 (L) 135 - 145 mmol/L   Potassium 3.3 (L) 3.5 - 5.1 mmol/L   Chloride 106 101 - 111 mmol/L   CO2 22 22 - 32 mmol/L   Glucose, Bld 148 (H) 65 - 99 mg/dL   BUN 14 6 - 20 mg/dL   Creatinine, Ser 0.66 0.44 - 1.00 mg/dL   Calcium 7.8 (L) 8.9 - 10.3 mg/dL   GFR calc non Af Amer >60 >60 mL/min   GFR calc Af Amer >60 >60 mL/min    Comment: (NOTE) The eGFR has been calculated using the CKD EPI equation. This calculation has not been validated in all clinical situations. eGFR's persistently <60 mL/min signify possible Chronic Kidney Disease.    Anion gap 6 5 - 15  CBC     Status: Abnormal   Collection Time: 07/17/16  5:48 AM  Result Value Ref Range   WBC 11.1 (H) 4.0 - 10.5 K/uL   RBC 3.34 (L) 3.87 - 5.11 MIL/uL   Hemoglobin 7.0 (L) 12.0 - 15.0 g/dL   HCT 23.9 (L) 36.0 - 46.0 %   MCV 71.6 (L) 78.0 - 100.0 fL   MCH 20.1 (L) 26.0 - 34.0 pg   MCHC 28.0 (L) 30.0 - 36.0 g/dL   RDW 16.9 (H) 11.5 - 15.5 %   Platelets 220 150 - 400 K/uL  Glucose, capillary     Status: Abnormal   Collection Time: 07/17/16  7:12 AM  Result Value Ref Range    Glucose-Capillary 147 (H) 65 - 99 mg/dL   Comment 1 Notify RN    Comment 2 Document in Chart     Dg Chest 2 View  Result Date: 07/16/2016 CLINICAL DATA:  Abdominal pain and chills. EXAM: CHEST  2 VIEW COMPARISON:  None. FINDINGS: The heart size and mediastinal contours are within normal limits. Both lungs are clear. The visualized skeletal structures are unremarkable. IMPRESSION: No active cardiopulmonary disease. Electronically Signed   By: Dorise Bullion III M.D   On: 07/16/2016 16:23   Ct Abdomen Pelvis W Contrast  Result Date: 07/16/2016 CLINICAL DATA:  Diffuse abdominal pain and loose stool since Saturday. EXAM: CT ABDOMEN AND  PELVIS WITH CONTRAST TECHNIQUE: Multidetector CT imaging of the abdomen and pelvis was performed using the standard protocol following bolus administration of intravenous contrast. CONTRAST:  125m ISOVUE-300 IOPAMIDOL (ISOVUE-300) INJECTION 61% COMPARISON:  None. FINDINGS: Lower chest: The lung bases are clear. No pulmonary nodules or pleural effusion. The heart is normal in size. No pericardial effusion. The distal esophagus is grossly normal. Hepatobiliary: No focal hepatic lesions or intrahepatic biliary dilatation. Mild gallbladder distention. No gallbladder wall thickening or pericholecystic inflammatory change to suggest acute cholecystitis. No common bile duct dilatation. Pancreas: No mass, inflammation or ductal dilatation. Spleen: Normal size.  No focal lesions. Adrenals/Urinary Tract: The adrenal glands and kidneys are unremarkable except for small lower pole calculi. No obstructing ureteral calculi or bladder calculi. Stomach/Bowel: Surgical changes from gastric bypass surgery. No complicating features are demonstrated. The small bowel is unremarkable. The terminal ileum is normal. There is moderate fluid in the right colon. The left colon is relatively decompressed. The appendix is markedly distended with wall thickening and periappendiceal inflammatory change.  Extra luminal gas is seen. The tip of the appendix is normal. Findings most consistent with contained perforated appendicitis. Vascular/Lymphatic: No mesenteric or retroperitoneal mass or adenopathy. The aorta and branch vessels are patent. The major venous structures are patent. Reproductive: The uterus and ovaries are unremarkable. Other: Small to moderate amount of free pelvic fluid. Musculoskeletal: No significant bony findings. IMPRESSION: 1. CT findings consistent with perforated appendicitis. Probable associated mild ileus. 2. Status post gastric bypass surgery. No complicating features are demonstrated. 3. Small bilateral renal calculi but no obstructing ureteral calculi. 4. Possible left lower breast lesion. **An incidental finding of potential clinical significance has been found. Recommend correlation with physical examination and any prior recent mammograms or ultrasound. Patient may need a diagnostic breast imaging workup.** Electronically Signed   By: PMarijo SanesM.D.   On: 07/16/2016 16:57    ROS:  Pertinent items noted in HPI and remainder of comprehensive ROS otherwise negative.  Blood pressure (!) 149/65, pulse (!) 106, temperature (!) 102.7 F (39.3 C), temperature source Oral, resp. rate 18, height 5' 10"  (1.778 m), weight 88.5 kg (195 lb), last menstrual period 07/09/2016, SpO2 92 %. Physical Exam: Well-developed well-nourished white female in no acute distress. Head is normocephalic, atraumatic. Neck is supple without JVD. No lymphadenopathy is noted. Lungs clear to auscultation with equal breath sounds bilaterally. No rales, rhonchi, or wheezing. Heart examination reveals a regular rate and rhythm without S3, S4, murmurs. Abdomen is soft with localized tenderness in the right lower quadrant over McBurney's point. No hepatosplenomegaly, masses, hernias, or rigidity are noted. No diffuse peritoneal signs noted.  Assessment/Plan: Impression: Contained perforated acute  appendicitis. Anemia secondary to previous gastric bypass surgery. Mild hypokalemia Plan: Since the patient already has a contained perforated appendicitis, there is no indication for surgery. She does not have peritonitis. Would continue IV Zosyn. May start clear liquid diet. Will follow with you.  Bora Bost A 07/17/2016, 8:09 AM    Incidental finding of a left breast mass on CT scan. Patient is aware. Will get bilateral mammogram tomorrow in a.m.

## 2016-07-17 NOTE — Progress Notes (Signed)
Patient's temperature is 102.6 orally at completion of 1st unit of blood. MD called and one time order was given for Tylenol 1000mg  and to given antibiotics due at this time. Recheck temperature after an hour to see if it is trending down and then proceed with 2nd unit of blood.

## 2016-07-17 NOTE — Care Management Note (Signed)
Case Management Note  Patient Details  Name: Alease MedinaDeana Jefferson Easom MRN: [id number] Date of Birth: 1971/01/30  Subjective/Objective:                  Pt is from home and is ind with ADL's. She is employed, has PCP, transportation and no difficulty affording her medications. Pt will need OP mammogram. Attempt made to made appointment for mammogram prior to DC, however, surgeons office will have to schedule appointment after DC. Surgeon is aware. Pt is aware. Pt anticipates return home with self care.   Action/Plan: No CM needs anticipated.   Expected Discharge Date:    07/18/2016              Expected Discharge Plan:  Home/Self Care  In-House Referral:  NA  Discharge planning Services  CM Consult  Post Acute Care Choice:  NA Choice offered to:  NA  DME Arranged:    DME Agency:     HH Arranged:    HH Agency:     Status of Service:  Completed, signed off  If discussed at [employer] Length of Stay Meetings, dates discussed:    Additional Comments:  Malcolm MetroChildress, Aubriel Khanna Demske, RN 07/17/2016, 11:44 AM

## 2016-07-17 NOTE — Progress Notes (Signed)
PROGRESS NOTE    Norma Carroll  ZOX:[id number] DOB: 12/13/1970 DOA: 07/16/2016 PCP: Erasmo Downer, NP    Brief Narrative: patient with hx of gastric bypass surgery, hx of anemia, B12 deficiency, HTN, DM on Metformin (Metformin can cause B12 deficiency), admitted for perforated but contained appendicitis.  Surgery has seen her, and recommended IV antibiotics.  She has a hx of heart murmur, and some hx of menorrhagia.    Assessment & Plan:   Principal Problem:   Acute perforated appendicitis Active Problems:   Vitamin B12 deficiency   Diabetes (HCC)   HTN (hypertension)   Breast mass, left/CT abd from 07/16/16   1. Perforated appendicitis:  Continue with IV Antibiotics, diet and management per surgery. 2. Anemia:  She has iron deficiency anemia, and likely B12 as well.  Could be from poor absorption.  Her Hb is 7.4 g, and we will give her 2 units of PRBC, which will give her some iron infusion also.   3. Hx of B12 deficiency:  Likely from gastric bypass, but Mefformin can cause low B12.  Will give one shot of B12, and she will likely need life long monthly shots. 4. DM:  Continue with Metformin. 5. Heart murmur:  Flow.  Likely from the enemia.  DVT prophylaxis: Lovenox Code Status: FULL CODE. Family Communication: None. Disposition Plan: To home when appropriate.   Consultants:   Dr Lovell Sheehan of General Surgery.  Procedures:   None   Antimicrobials: Anti-infectives    Start     Dose/Rate Route Frequency Ordered Stop   07/16/16 2200  piperacillin-tazobactam (ZOSYN) IVPB 3.375 g     3.375 g 12.5 mL/hr over 240 Minutes Intravenous Every 8 hours 07/16/16 1847     07/16/16 1715  piperacillin-tazobactam (ZOSYN) IVPB 3.375 g     3.375 g 100 mL/hr over 30 Minutes Intravenous  Once 07/16/16 1705 07/16/16 1748       Subjective:  Patient felt a little bit better.   Objective: Vitals:   07/16/16 1851 07/16/16 1945 07/16/16 2010 07/17/16 0500  BP:  135/61  (!)  149/65  Pulse:  96  (!) 106  Resp:  18  18  Temp: 102.9 F (39.4 C) (!) 100.7 F (38.2 C)  (!) 102.7 F (39.3 C)  TempSrc: Oral Oral  Oral  SpO2:  97% 97% 92%  Weight:      Height:       No intake or output data in the 24 hours ending 07/17/16 0922 Filed Weights   07/16/16 1502  Weight: 88.5 kg (195 lb)    Examination:  General exam: Appears calm and comfortable  Respiratory system: Clear to auscultation. Respiratory effort normal. Cardiovascular system: S1 & S2 heard, RRR. No JVD, murmurs, rubs, gallops or clicks. No pedal edema. Gastrointestinal system: Abdomen is nondistended, soft and only slightly tender. No organomegaly or masses felt. Normal bowel sounds heard. Central nervous system: Alert and oriented. No focal neurological deficits. Extremities: Symmetric 5 x 5 power. Skin: No rashes, lesions or ulcers Psychiatry: Judgement and insight appear normal. Mood & affect appropriate.   Data Reviewed: I have personally reviewed following labs and imaging studies  CBC:  Recent Labs Lab 07/16/16 1535 07/16/16 2030 07/17/16 0548  WBC 16.3* 11.7* 11.1*  NEUTROABS 14.9*  --   --   HGB 8.1* 7.4* 7.0*  HCT 28.2* 25.4* 23.9*  MCV 69.8* 69.8* 71.6*  PLT 300 250 220   Basic Metabolic Panel:  Recent Labs Lab 07/16/16 1535 07/16/16 2030  07/17/16 0548  NA 136  --  134*  K 3.2*  --  3.3*  CL 103  --  106  CO2 22  --  22  GLUCOSE 134*  --  148*  BUN 17  --  14  CREATININE 0.66 0.70 0.66  CALCIUM 8.9  --  7.8*   GFR: Estimated Creatinine Clearance: 107.2 mL/min (by C-G formula based on SCr of 0.8 mg/dL). Liver Function Tests:  Recent Labs Lab 07/16/16 1535  AST 14*  ALT 9*  ALKPHOS 61  BILITOT 0.8  PROT 7.5  ALBUMIN 3.9    Recent Labs Lab 07/16/16 1535  LIPASE 21   CBG:  Recent Labs Lab 07/17/16 0712  GLUCAP 147*   Thyroid Function Tests:  Recent Labs  07/16/16 2041  TSH 0.392   Sepsis Labs:  Recent Labs Lab 07/16/16 2030  07/16/16 2246  LATICACIDVEN 1.3 1.2    Recent Results (from the past 240 hour(s))  Culture, blood (Routine X 2) w Reflex to ID Panel     Status: None (Preliminary result)   Collection Time: 07/16/16  8:30 PM  Result Value Ref Range Status   Specimen Description LEFT ANTECUBITAL  Final   Special Requests BOTTLES DRAWN AEROBIC AND ANAEROBIC 6CC  Final   Culture NO GROWTH < 12 HOURS  Final   Report Status PENDING  Incomplete  Culture, blood (Routine X 2) w Reflex to ID Panel     Status: None (Preliminary result)   Collection Time: 07/16/16  8:30 PM  Result Value Ref Range Status   Specimen Description BLOOD LEFT HAND  Final   Special Requests BOTTLES DRAWN AEROBIC ONLY 6CC  Final   Culture NO GROWTH < 12 HOURS  Final   Report Status PENDING  Incomplete     Radiology Studies: Dg Chest 2 View  Result Date: 07/16/2016 CLINICAL DATA:  Abdominal pain and chills. EXAM: CHEST  2 VIEW COMPARISON:  None. FINDINGS: The heart size and mediastinal contours are within normal limits. Both lungs are clear. The visualized skeletal structures are unremarkable. IMPRESSION: No active cardiopulmonary disease. Electronically Signed   By: Gerome Sam III M.D   On: 07/16/2016 16:23   Ct Abdomen Pelvis W Contrast  Result Date: 07/16/2016 CLINICAL DATA:  Diffuse abdominal pain and loose stool since Saturday. EXAM: CT ABDOMEN AND PELVIS WITH CONTRAST TECHNIQUE: Multidetector CT imaging of the abdomen and pelvis was performed using the standard protocol following bolus administration of intravenous contrast. CONTRAST:  ISOVUE-300 IOPAMIDOL (ISOVUE-300) INJECTION 61% COMPARISON:  None. FINDINGS: Lower chest: The lung bases are clear. No pulmonary nodules or pleural effusion. The heart is normal in size. No pericardial effusion. The distal esophagus is grossly normal. Hepatobiliary: No focal hepatic lesions or intrahepatic biliary dilatation. Mild gallbladder distention. No gallbladder wall thickening or  pericholecystic inflammatory change to suggest acute cholecystitis. No common bile duct dilatation. Pancreas: No mass, inflammation or ductal dilatation. Spleen: Normal size.  No focal lesions. Adrenals/Urinary Tract: The adrenal glands and kidneys are unremarkable except for small lower pole calculi. No obstructing ureteral calculi or bladder calculi. Stomach/Bowel: Surgical changes from gastric bypass surgery. No complicating features are demonstrated. The small bowel is unremarkable. The terminal ileum is normal. There is moderate fluid in the right colon. The left colon is relatively decompressed. The appendix is markedly distended with wall thickening and periappendiceal inflammatory change. Extra luminal gas is seen. The tip of the appendix is normal. Findings most consistent with contained perforated appendicitis. Vascular/Lymphatic: No mesenteric  or retroperitoneal mass or adenopathy. The aorta and branch vessels are patent. The major venous structures are patent. Reproductive: The uterus and ovaries are unremarkable. Other: Small to moderate amount of free pelvic fluid. Musculoskeletal: No significant bony findings. IMPRESSION: 1. CT findings consistent with perforated appendicitis. Probable associated mild ileus. 2. Status post gastric bypass surgery. No complicating features are demonstrated. 3. Small bilateral renal calculi but no obstructing ureteral calculi. 4. Possible left lower breast lesion. **An incidental finding of potential clinical significance has been found. Recommend correlation with physical examination and any prior recent mammograms or ultrasound. Patient may need a diagnostic breast imaging workup.** Electronically Signed   By: Rudie MeyerP.  Gallerani M.D.   On: 07/16/2016 16:57    Scheduled Meds: . sodium chloride   Intravenous Once  . cyanocobalamin  1,000 mcg Intramuscular Once  . insulin aspart  1 Units Subcutaneous Once  . piperacillin-tazobactam (ZOSYN)  IV  3.375 g Intravenous Q8H   . sodium chloride flush  3 mL Intravenous Q12H   Continuous Infusions: . sodium chloride 125 mL/hr at 07/16/16 1533  . sodium chloride 150 mL/hr at 07/17/16 0548     LOS: 1 day   Norma Dicarlo, MD [facility] CenterFACP Hospitalist.   If 7PM-7AM, please contact night-coverage [email or web address] Password [email or web address] 07/17/2016, 9:22 AM

## 2016-07-18 DIAGNOSIS — K352 Acute appendicitis with generalized peritonitis: Principal | ICD-10-CM

## 2016-07-18 DIAGNOSIS — N63 Unspecified lump in breast: Secondary | ICD-10-CM

## 2016-07-18 DIAGNOSIS — I10 Essential (primary) hypertension: Secondary | ICD-10-CM

## 2016-07-18 DIAGNOSIS — E538 Deficiency of other specified B group vitamins: Secondary | ICD-10-CM

## 2016-07-18 DIAGNOSIS — E118 Type 2 diabetes mellitus with unspecified complications: Secondary | ICD-10-CM

## 2016-07-18 LAB — BASIC METABOLIC PANEL
Anion gap: 6 (ref 5–15)
BUN: 13 mg/dL (ref 6–20)
CALCIUM: 8.3 mg/dL — AB (ref 8.9–10.3)
CO2: 24 mmol/L (ref 22–32)
CREATININE: 0.45 mg/dL (ref 0.44–1.00)
Chloride: 105 mmol/L (ref 101–111)
Glucose, Bld: 150 mg/dL — ABNORMAL HIGH (ref 65–99)
Potassium: 3.4 mmol/L — ABNORMAL LOW (ref 3.5–5.1)
Sodium: 135 mmol/L (ref 135–145)

## 2016-07-18 LAB — GLUCOSE, CAPILLARY
GLUCOSE-CAPILLARY: 125 mg/dL — AB (ref 65–99)
GLUCOSE-CAPILLARY: 134 mg/dL — AB (ref 65–99)
Glucose-Capillary: 118 mg/dL — ABNORMAL HIGH (ref 65–99)
Glucose-Capillary: 124 mg/dL — ABNORMAL HIGH (ref 65–99)

## 2016-07-18 LAB — FOLATE RBC
FOLATE, HEMOLYSATE: 315.6 ng/mL
FOLATE, RBC: 1321 ng/mL (ref >498)
Hematocrit: 23.9 % — ABNORMAL LOW (ref 34.0–46.6)

## 2016-07-18 LAB — CBC
HCT: 26.1 % — ABNORMAL LOW (ref 36.0–46.0)
Hemoglobin: 7.8 g/dL — ABNORMAL LOW (ref 12.0–15.0)
MCH: 21.8 pg — AB (ref 26.0–34.0)
MCHC: 29.9 g/dL — AB (ref 30.0–36.0)
MCV: 73.1 fL — ABNORMAL LOW (ref 78.0–100.0)
PLATELETS: 199 10*3/uL (ref 150–400)
RBC: 3.57 MIL/uL — AB (ref 3.87–5.11)
RDW: 17.3 % — ABNORMAL HIGH (ref 11.5–15.5)
WBC: 10 10*3/uL (ref 4.0–10.5)

## 2016-07-18 MED ORDER — KCL IN DEXTROSE-NACL 40-5-0.45 MEQ/L-%-% IV SOLN
INTRAVENOUS | Status: DC
Start: 2016-07-18 — End: 2016-07-19
  Administered 2016-07-18: 12:00:00 via INTRAVENOUS

## 2016-07-18 MED ORDER — CALCIUM CITRATE-VITAMIN D 500-400 MG-UNIT PO CHEW
1.0000 | CHEWABLE_TABLET | Freq: Three times a day (TID) | ORAL | Status: DC
  Filled 2016-07-18 (×5): qty 1

## 2016-07-18 MED ORDER — POTASSIUM CHLORIDE CRYS ER 20 MEQ PO TBCR
40.0000 meq | EXTENDED_RELEASE_TABLET | ORAL | Status: AC
  Administered 2016-07-18 (×2): 40 meq via ORAL
  Filled 2016-07-18 (×2): qty 2

## 2016-07-18 MED ORDER — SODIUM CHLORIDE 0.9 % IV SOLN: Freq: Once | INTRAVENOUS | Status: DC

## 2016-07-18 MED ORDER — CYANOCOBALAMIN 1000 MCG/ML IJ SOLN
1000.0000 ug | Freq: Every day | INTRAMUSCULAR | Status: DC
  Administered 2016-07-18: 1000 ug via INTRAMUSCULAR
  Filled 2016-07-18: qty 1

## 2016-07-18 MED ORDER — TAB-A-VITE/IRON PO TABS
1.0000 | ORAL_TABLET | Freq: Two times a day (BID) | ORAL | Status: DC
  Filled 2016-07-18 (×4): qty 1

## 2016-07-18 NOTE — Progress Notes (Signed)
Pt CBG 125 

## 2016-07-18 NOTE — Progress Notes (Signed)
PROGRESS NOTE    Norma Carroll  YNW:[id number] DOB: September 01, 1971 DOA: 07/16/2016 PCP: Erasmo Downer, NP   Brief Narrative: 89 yyof with PMHx of  HTN, DM and anemia presented with complaints of abdominal pain. While in the ED she was noted to have low hemoglobin. She was admitted for perforated, but contained, appendicitis, as revealed by the CT scan. Surgery has been consulted and recommended IV antibiotics.   Assessment & Plan:   Principal Problem:   Acute perforated appendicitis Active Problems:   Vitamin B12 deficiency   Diabetes (HCC)   HTN (hypertension)   Breast mass, left/CT abd from 07/16/16  1. Acute perforated appendicitis. Surgery has been consulted and recommended advancing her diet. She will need a two-week course of oral antibiotics. Continue IV Zosyn for now.   2. Anemia, microcytic, chronic. She has iron deficiency anemia. Likely due to chronic blood loss with menstrual cycle as well as impaired absorption s/p gastric bypass. She was transfused 2 units prbc on 9/5. Repeat cbc in AM  3. DM. Stable. Continue SSI 4. HTN. Stable. Continue outpatient regimen.  5. B12 deficiency. Patient is s/p gastric bypass. She has not been taking any B12 supplements. She will receive B12 inj while in hospital. Can likely discharge on sublingual B12. She will need lifelong supplementation. 6. Left breast lesion. CT revealed a lower left breast lesion. Patient will have a mammogram as an outpatient.    DVT prophylaxis: Lovenox Code Status: Full Family Communication: husband at bedside Disposition Plan: Anticipate discharge in 24-48 hours.    Consultants:   General surgery   Procedures:   None  Antimicrobials:  Zosyn 9/4 >>  Subjective: Still has pain in abdomen, worse after eating. No vomiting.  Objective: Vitals:   07/18/16 0049 07/18/16 0103 07/18/16 0340 07/18/16 0423  BP: 119/66 123/67 123/72 (!) 123/53  Pulse: 72 77 76 77  Resp: 18 18 18 18   Temp:  98.6 F (37 C) 98.2 F (36.8 C) 98.6 F (37 C) 98.3 F (36.8 C)  TempSrc:    Oral  SpO2: 94% 97% 97% 99%  Weight:      Height:        Intake/Output Summary (Last 24 hours) at 07/18/16 0742 Last data filed at 07/18/16 0529  Gross per 24 hour  Intake             1628 ml  Output                0 ml  Net             1628 ml   Filed Weights   07/16/16 1502  Weight: 88.5 kg (195 lb)    Examination:  General exam: Appears calm and comfortable  Respiratory system: Clear to auscultation. Respiratory effort normal. Cardiovascular system: S1 & S2 heard, RRR. No JVD, murmurs, rubs, gallops or clicks. No pedal edema. Gastrointestinal system: Abdomen is nondistended, soft and tender in periumbilical area. No organomegaly or masses felt. Normal bowel sounds heard. Central nervous system: Alert and oriented. No focal neurological deficits. Extremities: Symmetric 5 x 5 power. Skin: No rashes, lesions or ulcers Psychiatry: Judgement and insight appear normal. Mood & affect appropriate.     Data Reviewed: I have personally reviewed following labs and imaging studies  CBC:  Recent Labs Lab 07/16/16 1535 07/16/16 2030 07/17/16 0548 07/18/16 0510  WBC 16.3* 11.7* 11.1* 10.0  NEUTROABS 14.9*  --   --   --   HGB 8.1* 7.4* 7.0*  7.8*  HCT 28.2* 25.4* 23.9* 26.1*  MCV 69.8* 69.8* 71.6* 73.1*  PLT 300 250 220 199   Basic Metabolic Panel:  Recent Labs Lab 07/16/16 1535 07/16/16 2030 07/17/16 0548 07/18/16 0510  NA 136  --  134* 135  K 3.2*  --  3.3* 3.4*  CL 103  --  106 105  CO2 22  --  22 24  GLUCOSE 134*  --  148* 150*  BUN 17  --  14 13  CREATININE 0.66 0.70 0.66 0.45  CALCIUM 8.9  --  7.8* 8.3*   GFR: Estimated Creatinine Clearance: 107.2 mL/min (by C-G formula based on SCr of 0.8 mg/dL). Liver Function Tests:  Recent Labs Lab 07/16/16 1535  AST 14*  ALT 9*  ALKPHOS 61  BILITOT 0.8  PROT 7.5  ALBUMIN 3.9    Recent Labs Lab 07/16/16 1535  LIPASE 21    No results for input(s): AMMONIA in the last 168 hours. Coagulation Profile: No results for input(s): INR, PROTIME in the last 168 hours. Cardiac Enzymes: No results for input(s): CKTOTAL, CKMB, CKMBINDEX, TROPONINI in the last 168 hours. BNP (last 3 results) No results for input(s): PROBNP in the last 8760 hours. HbA1C: No results for input(s): HGBA1C in the last 72 hours. CBG:  Recent Labs Lab 07/17/16 0712 07/17/16 1124 07/17/16 1634 07/17/16 2039 07/18/16 0717  GLUCAP 147* 112* 132* 134* 125*   Lipid Profile: No results for input(s): CHOL, HDL, LDLCALC, TRIG, CHOLHDL, LDLDIRECT in the last 72 hours. Thyroid Function Tests:  Recent Labs  07/16/16 2041  TSH 0.392   Anemia Panel:  Recent Labs  07/16/16 2036  VITAMINB12 91*  FERRITIN 27  TIBC 335  IRON 8*   Sepsis Labs:  Recent Labs Lab 07/16/16 2030 07/16/16 2246  LATICACIDVEN 1.3 1.2    Recent Results (from the past 240 hour(s))  Culture, blood (Routine X 2) w Reflex to ID Panel     Status: None (Preliminary result)   Collection Time: 07/16/16  8:30 PM  Result Value Ref Range Status   Specimen Description LEFT ANTECUBITAL  Final   Special Requests BOTTLES DRAWN AEROBIC AND ANAEROBIC 6CC  Final   Culture NO GROWTH < 12 HOURS  Final   Report Status PENDING  Incomplete  Culture, blood (Routine X 2) w Reflex to ID Panel     Status: None (Preliminary result)   Collection Time: 07/16/16  8:30 PM  Result Value Ref Range Status   Specimen Description BLOOD LEFT HAND  Final   Special Requests BOTTLES DRAWN AEROBIC ONLY 6CC  Final   Culture NO GROWTH < 12 HOURS  Final   Report Status PENDING  Incomplete         Radiology Studies: Dg Chest 2 View  Result Date: 07/16/2016 CLINICAL DATA:  Abdominal pain and chills. EXAM: CHEST  2 VIEW COMPARISON:  None. FINDINGS: The heart size and mediastinal contours are within normal limits. Both lungs are clear. The visualized skeletal structures are unremarkable.  IMPRESSION: No active cardiopulmonary disease. Electronically Signed   By: Gerome Samavid  Williams III M.D   On: 07/16/2016 16:23   Ct Abdomen Pelvis W Contrast  Result Date: 07/16/2016 CLINICAL DATA:  Diffuse abdominal pain and loose stool since Saturday. EXAM: CT ABDOMEN AND PELVIS WITH CONTRAST TECHNIQUE: Multidetector CT imaging of the abdomen and pelvis was performed using the standard protocol following bolus administration of intravenous contrast. CONTRAST:  100mL ISOVUE-300 IOPAMIDOL (ISOVUE-300) INJECTION 61% COMPARISON:  None. FINDINGS: Lower chest: The  lung bases are clear. No pulmonary nodules or pleural effusion. The heart is normal in size. No pericardial effusion. The distal esophagus is grossly normal. Hepatobiliary: No focal hepatic lesions or intrahepatic biliary dilatation. Mild gallbladder distention. No gallbladder wall thickening or pericholecystic inflammatory change to suggest acute cholecystitis. No common bile duct dilatation. Pancreas: No mass, inflammation or ductal dilatation. Spleen: Normal size.  No focal lesions. Adrenals/Urinary Tract: The adrenal glands and kidneys are unremarkable except for small lower pole calculi. No obstructing ureteral calculi or bladder calculi. Stomach/Bowel: Surgical changes from gastric bypass surgery. No complicating features are demonstrated. The small bowel is unremarkable. The terminal ileum is normal. There is moderate fluid in the right colon. The left colon is relatively decompressed. The appendix is markedly distended with wall thickening and periappendiceal inflammatory change. Extra luminal gas is seen. The tip of the appendix is normal. Findings most consistent with contained perforated appendicitis. Vascular/Lymphatic: No mesenteric or retroperitoneal mass or adenopathy. The aorta and branch vessels are patent. The major venous structures are patent. Reproductive: The uterus and ovaries are unremarkable. Other: Small to moderate amount of free  pelvic fluid. Musculoskeletal: No significant bony findings. IMPRESSION: 1. CT findings consistent with perforated appendicitis. Probable associated mild ileus. 2. Status post gastric bypass surgery. No complicating features are demonstrated. 3. Small bilateral renal calculi but no obstructing ureteral calculi. 4. Possible left lower breast lesion. **An incidental finding of potential clinical significance has been found. Recommend correlation with physical examination and any prior recent mammograms or ultrasound. Patient may need a diagnostic breast imaging workup.** Electronically Signed   By: Rudie Meyer M.D.   On: 07/16/2016 16:57        Scheduled Meds: . sodium chloride   Intravenous Once  . enoxaparin (LOVENOX) injection  40 mg Subcutaneous Q24H  . insulin aspart  1 Units Subcutaneous Once  . piperacillin-tazobactam (ZOSYN)  IV  3.375 g Intravenous Q8H  . sodium chloride flush  3 mL Intravenous Q12H   Continuous Infusions: . sodium chloride 125 mL/hr at 07/16/16 1533  . sodium chloride 150 mL/hr at 07/17/16 0548     LOS: 2 days    Time spent: 25 minutes  Erick Blinks, MD [facility] If 7PM-7AM, please contact night-coverage [email or web address] Password [email or web address] 07/18/2016, 7:42 AM

## 2016-07-18 NOTE — Progress Notes (Signed)
Pt CBG 134.

## 2016-07-18 NOTE — Progress Notes (Signed)
Subjective: Patient states she still has mild right lower quadrant abdominal pain, though it is much improved since her admission. She is tolerating clear liquid diet well. She has had a bowel movement.  Objective: Vital signs in last 24 hours: Temp:  [98.2 F (36.8 C)-103.1 F (39.5 C)] 98.3 F (36.8 C) (09/06 0423) Pulse Rate:  [72-104] 77 (09/06 0423) Resp:  [15-18] 18 (09/06 0423) BP: (119-143)/(53-72) 123/53 (09/06 0423) SpO2:  [92 %-99 %] 99 % (09/06 0423) Last BM Date: 07/18/16  Intake/Output from previous day: 09/05 0701 - 09/06 0700 In: 1628 [P.O.:720; Blood:758; IV Piggyback:150] Out: -  Intake/Output this shift: No intake/output data recorded.  General appearance: alert, cooperative and no distress GI: Soft with minimal tenderness in the right lower quadrant to palpation. No rigidity is noted. No peritoneal signs are noted.  Lab Results:   Recent Labs  07/17/16 0548 07/18/16 0510  WBC 11.1* 10.0  HGB 7.0* 7.8*  HCT 23.9* 26.1*  PLT 220 199   BMET  Recent Labs  07/17/16 0548 07/18/16 0510  NA 134* 135  K 3.3* 3.4*  CL 106 105  CO2 22 24  GLUCOSE 148* 150*  BUN 14 13  CREATININE 0.66 0.45  CALCIUM 7.8* 8.3*   PT/INR No results for input(s): LABPROT, INR in the last 72 hours.  Studies/Results: Dg Chest 2 View  Result Date: 07/16/2016 CLINICAL DATA:  Abdominal pain and chills. EXAM: CHEST  2 VIEW COMPARISON:  None. FINDINGS: The heart size and mediastinal contours are within normal limits. Both lungs are clear. The visualized skeletal structures are unremarkable. IMPRESSION: No active cardiopulmonary disease. Electronically Signed   By: Norma Samavid  Williams Carroll M.D   On: 07/16/2016 16:23   Ct Abdomen Pelvis W Contrast  Result Date: 07/16/2016 CLINICAL DATA:  Diffuse abdominal pain and loose stool since Saturday. EXAM: CT ABDOMEN AND PELVIS WITH CONTRAST TECHNIQUE: Multidetector CT imaging of the abdomen and pelvis was performed using the standard  protocol following bolus administration of intravenous contrast. CONTRAST:  100mL ISOVUE-300 IOPAMIDOL (ISOVUE-300) INJECTION 61% COMPARISON:  None. FINDINGS: Lower chest: The lung bases are clear. No pulmonary nodules or pleural effusion. The heart is normal in size. No pericardial effusion. The distal esophagus is grossly normal. Hepatobiliary: No focal hepatic lesions or intrahepatic biliary dilatation. Mild gallbladder distention. No gallbladder wall thickening or pericholecystic inflammatory change to suggest acute cholecystitis. No common bile duct dilatation. Pancreas: No mass, inflammation or ductal dilatation. Spleen: Normal size.  No focal lesions. Adrenals/Urinary Tract: The adrenal glands and kidneys are unremarkable except for small lower pole calculi. No obstructing ureteral calculi or bladder calculi. Stomach/Bowel: Surgical changes from gastric bypass surgery. No complicating features are demonstrated. The small bowel is unremarkable. The terminal ileum is normal. There is moderate fluid in the right colon. The left colon is relatively decompressed. The appendix is markedly distended with wall thickening and periappendiceal inflammatory change. Extra luminal gas is seen. The tip of the appendix is normal. Findings most consistent with contained perforated appendicitis. Vascular/Lymphatic: No mesenteric or retroperitoneal mass or adenopathy. The aorta and branch vessels are patent. The major venous structures are patent. Reproductive: The uterus and ovaries are unremarkable. Other: Small to moderate amount of free pelvic fluid. Musculoskeletal: No significant bony findings. IMPRESSION: 1. CT findings consistent with perforated appendicitis. Probable associated mild ileus. 2. Status post gastric bypass surgery. No complicating features are demonstrated. 3. Small bilateral renal calculi but no obstructing ureteral calculi. 4. Possible left lower breast lesion. **An incidental finding  of potential  clinical significance has been found. Recommend correlation with physical examination and any prior recent mammograms or ultrasound. Patient may need a diagnostic breast imaging workup.** Electronically Signed   By: Norma Carroll M.D.   On: 07/16/2016 16:57    Anti-infectives: Anti-infectives    Start     Dose/Rate Route Frequency Ordered Stop   07/16/16 2200  piperacillin-tazobactam (ZOSYN) IVPB 3.375 g     3.375 g 12.5 mL/hr over 240 Minutes Intravenous Every 8 hours 07/16/16 1847     07/16/16 1715  piperacillin-tazobactam (ZOSYN) IVPB 3.375 g     3.375 g 100 mL/hr over 30 Minutes Intravenous  Once 07/16/16 1705 07/16/16 1748      Assessment/Plan: Impression: Contained perforated appendicitis, improving. Leukocytosis has normalized. She is afebrile the same. Plan: We'll advance diet as tolerated. Anticipate discharge in next 24-48 hours. She will need to be on a two-week course of oral antibiotics in total. Mammogram was canceled as this will be performed as an outpatient.  LOS: 2 days    Norma Carroll A 07/18/2016

## 2016-07-19 LAB — BASIC METABOLIC PANEL
Anion gap: 5 (ref 5–15)
BUN: 11 mg/dL (ref 6–20)
CALCIUM: 8.3 mg/dL — AB (ref 8.9–10.3)
CO2: 26 mmol/L (ref 22–32)
Chloride: 104 mmol/L (ref 101–111)
Creatinine, Ser: 0.52 mg/dL (ref 0.44–1.00)
GFR calc Af Amer: >60 mL/min (ref >60)
GLUCOSE: 122 mg/dL — AB (ref 65–99)
Potassium: 3.4 mmol/L — ABNORMAL LOW (ref 3.5–5.1)
Sodium: 135 mmol/L (ref 135–145)

## 2016-07-19 LAB — CBC
HCT: 25 % — ABNORMAL LOW (ref 36.0–46.0)
Hemoglobin: 7.3 g/dL — ABNORMAL LOW (ref 12.0–15.0)
MCH: 21.3 pg — AB (ref 26.0–34.0)
MCHC: 29.2 g/dL — ABNORMAL LOW (ref 30.0–36.0)
MCV: 73.1 fL — AB (ref 78.0–100.0)
PLATELETS: 197 10*3/uL (ref 150–400)
RBC: 3.42 MIL/uL — ABNORMAL LOW (ref 3.87–5.11)
RDW: 17.4 % — AB (ref 11.5–15.5)
WBC: 8.4 10*3/uL (ref 4.0–10.5)

## 2016-07-19 LAB — GLUCOSE, CAPILLARY: Glucose-Capillary: 106 mg/dL — ABNORMAL HIGH (ref 65–99)

## 2016-07-19 MED ORDER — AMOXICILLIN-POT CLAVULANATE 875-125 MG PO TABS: 1.0000 | ORAL_TABLET | Freq: Two times a day (BID) | ORAL | 0 refills | Status: DC

## 2016-07-19 MED ORDER — B-12 1000 MCG SL SUBL: 1000.0000 ug | SUBLINGUAL_TABLET | Freq: Every day | SUBLINGUAL | 1 refills | Status: DC

## 2016-07-19 MED ORDER — CALCIUM CARBONATE-VITAMIN D 500-200 MG-UNIT PO TABS: 1.0000 | ORAL_TABLET | Freq: Three times a day (TID) | ORAL | Status: DC

## 2016-07-19 MED ORDER — TAB-A-VITE/IRON PO TABS: 1.0000 | ORAL_TABLET | Freq: Two times a day (BID) | ORAL | 1 refills | Status: DC

## 2016-07-19 MED ORDER — CALCIUM CARBONATE-VITAMIN D 500-200 MG-UNIT PO TABS: 1.0000 | ORAL_TABLET | Freq: Three times a day (TID) | ORAL | 1 refills | Status: DC

## 2016-07-19 NOTE — Progress Notes (Signed)
  Subjective: Patient has no complaints.  Objective: Vital signs in last 24 hours: Temp:  [98.2 F (36.8 C)-99.6 F (37.6 C)] 98.3 F (36.8 C) (09/07 0604) Pulse Rate:  [69-81] 69 (09/07 0604) Resp:  [16-20] 16 (09/07 0604) BP: (124-140)/(72-78) 124/73 (09/07 0604) SpO2:  [93 %-99 %] 97 % (09/07 0604) Last BM Date: 07/18/16  Intake/Output from previous day: 09/06 0701 - 09/07 0700 In: 949.2 [P.O.:720; I.V.:179.2; IV Piggyback:50] Out: -  Intake/Output this shift: No intake/output data recorded.  General appearance: alert, cooperative and no distress GI: soft, non-tender; bowel sounds normal; no masses,  no organomegaly  Lab Results:   Recent Labs  07/18/16 0510 07/19/16 0503  WBC 10.0 8.4  HGB 7.8* 7.3*  HCT 26.1* 25.0*  PLT 199 197   BMET  Recent Labs  07/18/16 0510 07/19/16 0503  NA 135 135  K 3.4* 3.4*  CL 105 104  CO2 24 26  GLUCOSE 150* 122*  BUN 13 11  CREATININE 0.45 0.52  CALCIUM 8.3* 8.3*   PT/INR No results for input(s): LABPROT, INR in the last 72 hours.  Studies/Results: No results found.  Anti-infectives: Anti-infectives    Start     Dose/Rate Route Frequency Ordered Stop   07/16/16 2200  piperacillin-tazobactam (ZOSYN) IVPB 3.375 g     3.375 g 12.5 mL/hr over 240 Minutes Intravenous Every 8 hours 07/16/16 1847     07/16/16 1715  piperacillin-tazobactam (ZOSYN) IVPB 3.375 g     3.375 g 100 mL/hr over 30 Minutes Intravenous  Once 07/16/16 1705 07/16/16 1748      Assessment/Plan: Impression: Perforated appendicitis, resolving. Plan: Would discharge patient on Augmentin for 2 weeks. Patient will follow-up in my office.  LOS: 3 days    Norma Carroll A 07/19/2016

## 2016-07-19 NOTE — Progress Notes (Signed)
Pt's IV catheter removed and intact. Pt's IV site clean dry and intact. Discharge instructions including medications and follow up appointments reviewed and discussed with patient. Pt Verbalized understanding of discharge instructions. All questions were answered and no further questions at this time. Pt in stable condition and in no acute distress at time of discharge. Pt escorted by RN.

## 2016-07-19 NOTE — Progress Notes (Signed)
Pharmacy Antibiotic Note  Norma Carroll is a 45 y.o. female admitted on 07/16/2016 with sepsis/perforated appendicitis.  Pharmacy has been consulted for Zosyn dosing.  Plan: Zosyn 3.375g IV q8h (4 hour infusion). F/U cultures Monitor V/S, labs and clinical course  Height: 5\' 10"  (177.8 cm) Weight: 195 lb (88.5 kg) IBW/kg (Calculated) : 68.5  Temp (24hrs), Avg:98.7 F (37.1 C), Min:98.2 F (36.8 C), Max:99.6 F (37.6 C)   Recent Labs Lab 07/16/16 1535 07/16/16 2030 07/16/16 2246 07/17/16 0548 07/18/16 0510 07/19/16 0503  WBC 16.3* 11.7*  --  11.1* 10.0 8.4  CREATININE 0.66 0.70  --  0.66 0.45 0.52  LATICACIDVEN  --  1.3 1.2  --   --   --     Estimated Creatinine Clearance: 107.2 mL/min (by C-G formula based on SCr of 0.8 mg/dL).    No Known Allergies  Antimicrobials this admission: Zosyn 9/4 >>   Dose adjustments this admission: n/a  Microbiology results: 9/4 YQI:[id number]:pending  Thank you for allowing pharmacy to be a part of this patient's care.  Valrie HartScott Cameryn Chrisley, PharmD Clinical Pharmacist Pager:  [phone] 07/19/2016    07/19/2016 8:41 AM

## 2016-07-19 NOTE — Discharge Summary (Signed)
Physician Discharge Summary  Norma Carroll WUJ:[id number] DOB: 1971/05/25 DOA: 07/16/2016  PCP: Erasmo Downer, NP  Admit date: 07/16/2016 Discharge date: 07/19/2016  Admitted From: home Disposition:  home  Recommendations for Outpatient Follow-up:  1. Follow up with PCP in 1-2 weeks 2. Please obtain BMP/CBC in one week 3. Follow up with general surgery in 2 weeks 4. Patient will need outpatient mammogram of left breast mass  Home Health: Equipment/Devices:  Discharge Condition: stable CODE STATUS:full Diet recommendation: Heart Healthy / Carb Modified   Brief/Interim Summary: 1. Acute perforated appendicitis. Patient was seen by general surgery and was treated supportively with IV antibiotics. Her fever and abdominal pain resolved. She has been transitioned to oral abx. She has clinically improved and has tolerated advancement of diet. She will follow up with general surgery in 2 weeks.  2. Anemia, microcytic, chronic. She has iron deficiency anemia. Likely due to chronic blood loss with menstrual cycle as well as impaired absorption s/p gastric bypass. She was transfused 2 units prbc on 9/5. Hemoglobin has been stable and she does not have evidence of bleeding. She is asymptomatic. She has been started on iron supplementation. 3. DM. Stable. Resume metformin on discharge 4. HTN. Stable. Continue outpatient regimen.  5. B12 deficiency. Patient is s/p gastric bypass. She has not been taking any B12 supplements. She recieved B12 inj while in hospital. She will discharge on sublingual B12. She will need lifelong supplementation. 6. Left breast lesion. CT revealed a lower left breast lesion. Patient will have a mammogram as an outpatient.   Discharge Diagnoses:  Principal Problem:   Acute perforated appendicitis Active Problems:   Vitamin B12 deficiency   Diabetes (HCC)   HTN (hypertension)   Breast mass, left/CT abd from 07/16/16    Discharge Instructions  Discharge  Instructions    Diet - low sodium heart healthy    Complete by:  As directed   Increase activity slowly    Complete by:  As directed       Medication List    TAKE these medications   acetaminophen 500 MG tablet Commonly known as:  TYLENOL Take 500 mg by mouth every 6 (six) hours as needed for mild pain, moderate pain or headache.   amoxicillin-clavulanate 875-125 MG tablet Commonly known as:  AUGMENTIN Take 1 tablet by mouth 2 (two) times daily.   B-12 1000 MCG Subl Place 1,000 mcg under the tongue daily.   calcium-vitamin D 500-200 MG-UNIT tablet Commonly known as:  OSCAL WITH D Take 1 tablet by mouth 3 (three) times daily.   lisinopril 20 MG tablet Commonly known as:  PRINIVIL,ZESTRIL Take 20 mg by mouth daily.   metFORMIN 500 MG tablet Commonly known as:  GLUCOPHAGE Take 1,000 mg by mouth daily with breakfast. Take two 500 mg tablets every am   multivitamins with iron Tabs tablet Take 1 tablet by mouth 2 (two) times daily.      Follow-up Information    Dalia Heading, MD. Schedule an appointment as soon as possible for a visit on 08/02/2016.   Specialty:  General Surgery Contact information: [address] [place] [address] [phone]          No Known Allergies  Consultations:  Gen surgery   Procedures/Studies: Dg Chest 2 View  Result Date: 07/16/2016 CLINICAL DATA:  Abdominal pain and chills. EXAM: CHEST  2 VIEW COMPARISON:  None. FINDINGS: The heart size and mediastinal contours are within normal limits. Both lungs are clear. The visualized skeletal  structures are unremarkable. IMPRESSION: No active cardiopulmonary disease. Electronically Signed   By: Gerome Sam III M.D   On: 07/16/2016 16:23   Ct Abdomen Pelvis W Contrast  Result Date: 07/16/2016 CLINICAL DATA:  Diffuse abdominal pain and loose stool since Saturday. EXAM: CT ABDOMEN AND PELVIS WITH CONTRAST TECHNIQUE: Multidetector CT imaging of the abdomen and pelvis was performed  using the standard protocol following bolus administration of intravenous contrast. CONTRAST:  ISOVUE-300 IOPAMIDOL (ISOVUE-300) INJECTION 61% COMPARISON:  None. FINDINGS: Lower chest: The lung bases are clear. No pulmonary nodules or pleural effusion. The heart is normal in size. No pericardial effusion. The distal esophagus is grossly normal. Hepatobiliary: No focal hepatic lesions or intrahepatic biliary dilatation. Mild gallbladder distention. No gallbladder wall thickening or pericholecystic inflammatory change to suggest acute cholecystitis. No common bile duct dilatation. Pancreas: No mass, inflammation or ductal dilatation. Spleen: Normal size.  No focal lesions. Adrenals/Urinary Tract: The adrenal glands and kidneys are unremarkable except for small lower pole calculi. No obstructing ureteral calculi or bladder calculi. Stomach/Bowel: Surgical changes from gastric bypass surgery. No complicating features are demonstrated. The small bowel is unremarkable. The terminal ileum is normal. There is moderate fluid in the right colon. The left colon is relatively decompressed. The appendix is markedly distended with wall thickening and periappendiceal inflammatory change. Extra luminal gas is seen. The tip of the appendix is normal. Findings most consistent with contained perforated appendicitis. Vascular/Lymphatic: No mesenteric or retroperitoneal mass or adenopathy. The aorta and branch vessels are patent. The major venous structures are patent. Reproductive: The uterus and ovaries are unremarkable. Other: Small to moderate amount of free pelvic fluid. Musculoskeletal: No significant bony findings. IMPRESSION: 1. CT findings consistent with perforated appendicitis. Probable associated mild ileus. 2. Status post gastric bypass surgery. No complicating features are demonstrated. 3. Small bilateral renal calculi but no obstructing ureteral calculi. 4. Possible left lower breast lesion. **An incidental finding  of potential clinical significance has been found. Recommend correlation with physical examination and any prior recent mammograms or ultrasound. Patient may need a diagnostic breast imaging workup.** Electronically Signed   By: Rudie Meyer M.D.   On: 07/16/2016 16:57       Subjective: Patient is feeling better. Abdominal pain has improved. No vomiting  Discharge Exam: Vitals:   07/18/16 2147 07/19/16 0604  BP: 136/78 124/73  Pulse: 81 69  Resp: 20 16  Temp: 98.2 F (36.8 C) 98.3 F (36.8 C)   Vitals:   07/18/16 1519 07/18/16 1936 07/18/16 2147 07/19/16 0604  BP: 140/72  136/78 124/73  Pulse: 80  81 69  Resp: 18  20 16   Temp: 99.6 F (37.6 C)  98.2 F (36.8 C) 98.3 F (36.8 C)  TempSrc: Oral  Oral Oral  SpO2: 96% 93% 99% 97%  Weight:      Height:        General: Pt is alert, awake, not in acute distress Cardiovascular: RRR, S1/S2 +, no rubs, no gallops Respiratory: CTA bilaterally, no wheezing, no rhonchi Abdominal: Soft, NT, ND, bowel sounds + Extremities: no edema, no cyanosis    The results of significant diagnostics from this hospitalization (including imaging, microbiology, ancillary and laboratory) are listed below for reference.     Microbiology: Recent Results (from the past 240 hour(s))  Culture, blood (Routine X 2) w Reflex to ID Panel     Status: None (Preliminary result)   Collection Time: 07/16/16  8:30 PM  Result Value Ref Range Status   Specimen  Description BLOOD LEFT ANTECUBITAL  Final   Special Requests BOTTLES DRAWN AEROBIC AND ANAEROBIC 6CC  Final   Culture NO GROWTH 3 DAYS  Final   Report Status PENDING  Incomplete  Culture, blood (Routine X 2) w Reflex to ID Panel     Status: None (Preliminary result)   Collection Time: 07/16/16  8:30 PM  Result Value Ref Range Status   Specimen Description BLOOD LEFT HAND  Final   Special Requests BOTTLES DRAWN AEROBIC ONLY 6CC  Final   Culture NO GROWTH 3 DAYS  Final   Report Status PENDING   Incomplete     Labs: BNP (last 3 results) No results for input(s): BNP in the last 8760 hours. Basic Metabolic Panel:  Recent Labs Lab 07/16/16 1535 07/16/16 2030 07/17/16 0548 07/18/16 0510 07/19/16 0503  NA 136  --  134* 135 135  K 3.2*  --  3.3* 3.4* 3.4*  CL 103  --  106 105 104  CO2 22  --  22 24 26   GLUCOSE 134*  --  148* 150* 122*  BUN 17  --  14 13 11   CREATININE 0.66 0.70 0.66 0.45 0.52  CALCIUM 8.9  --  7.8* 8.3* 8.3*   Liver Function Tests:  Recent Labs Lab 07/16/16 1535  AST 14*  ALT 9*  ALKPHOS 61  BILITOT 0.8  PROT 7.5  ALBUMIN 3.9    Recent Labs Lab 07/16/16 1535  LIPASE 21   No results for input(s): AMMONIA in the last 168 hours. CBC:  Recent Labs Lab 07/16/16 1535 07/16/16 2030 07/16/16 2036 07/17/16 0548 07/18/16 0510 07/19/16 0503  WBC 16.3* 11.7*  --  11.1* 10.0 8.4  NEUTROABS 14.9*  --   --   --   --   --   HGB 8.1* 7.4*  --  7.0* 7.8* 7.3*  HCT 28.2* 25.4* 23.9* 23.9* 26.1* 25.0*  MCV 69.8* 69.8*  --  71.6* 73.1* 73.1*  PLT 300 250  --  220 199 197   Cardiac Enzymes: No results for input(s): CKTOTAL, CKMB, CKMBINDEX, TROPONINI in the last 168 hours. BNP: Invalid input(s): POCBNP CBG:  Recent Labs Lab 07/18/16 0717 07/18/16 1117 07/18/16 1616 07/18/16 2146 07/19/16 0725  GLUCAP 125* 134* 118* 124* 106*   D-Dimer No results for input(s): DDIMER in the last 72 hours. Hgb A1c No results for input(s): HGBA1C in the last 72 hours. Lipid Profile No results for input(s): CHOL, HDL, LDLCALC, TRIG, CHOLHDL, LDLDIRECT in the last 72 hours. Thyroid function studies  Recent Labs  07/16/16 2041  TSH 0.392   Anemia work up  Recent Labs  07/16/16 2036  VITAMINB12 91*  FERRITIN 27  TIBC 335  IRON 8*   Urinalysis    Component Value Date/Time   COLORURINE YELLOW 07/16/2016 1510   APPEARANCEUR CLEAR 07/16/2016 1510   LABSPEC >1.030 (H) 07/16/2016 1510   PHURINE 5.0 07/16/2016 1510   GLUCOSEU NEGATIVE 07/16/2016  1510   HGBUR TRACE (A) 07/16/2016 1510   BILIRUBINUR NEGATIVE 07/16/2016 1510   KETONESUR NEGATIVE 07/16/2016 1510   PROTEINUR TRACE (A) 07/16/2016 1510   NITRITE POSITIVE (A) 07/16/2016 1510   LEUKOCYTESUR NEGATIVE 07/16/2016 1510   Sepsis Labs Invalid input(s): PROCALCITONIN,  WBC,  LACTICIDVEN Microbiology Recent Results (from the past 240 hour(s))  Culture, blood (Routine X 2) w Reflex to ID Panel     Status: None (Preliminary result)   Collection Time: 07/16/16  8:30 PM  Result Value Ref Range Status   Specimen  Description BLOOD LEFT ANTECUBITAL  Final   Special Requests BOTTLES DRAWN AEROBIC AND ANAEROBIC 6CC  Final   Culture NO GROWTH 3 DAYS  Final   Report Status PENDING  Incomplete  Culture, blood (Routine X 2) w Reflex to ID Panel     Status: None (Preliminary result)   Collection Time: 07/16/16  8:30 PM  Result Value Ref Range Status   Specimen Description BLOOD LEFT HAND  Final   Special Requests BOTTLES DRAWN AEROBIC ONLY 6CC  Final   Culture NO GROWTH 3 DAYS  Final   Report Status PENDING  Incomplete     Time coordinating discharge: Over 30 minutes  SIGNED:   Erick BlinksMEMON,Sherilyn Windhorst, MD  [facility] 07/19/2016, 10:36 AM Pager   If 7PM-7AM, please contact night-coverage [email or web address] Password [email or web address]

## 2016-07-20 LAB — TYPE AND SCREEN
ABO/RH(D): A POS
Antibody Screen: NEGATIVE
UNIT DIVISION: 0
UNIT DIVISION: 0
Unit division: 0

## 2016-07-21 LAB — CULTURE, BLOOD (ROUTINE X 2)
Culture: NO GROWTH
Culture: NO GROWTH

## 2019-03-31 ENCOUNTER — Inpatient Hospital Stay (HOSPITAL_COMMUNITY)
Admission: EM | Admit: 2019-03-31 | Discharge: 2019-04-02 | DRG: 340 | Disposition: A | Discharge status: Home / Self Care | Payer: Self-pay | Attending: General Surgery | Admitting: General Surgery

## 2019-03-31 ENCOUNTER — Other Ambulatory Visit: Payer: Self-pay

## 2019-03-31 ENCOUNTER — Encounter (HOSPITAL_COMMUNITY): Payer: Self-pay

## 2019-03-31 DIAGNOSIS — D649 Anemia, unspecified: Secondary | ICD-10-CM | POA: Diagnosis present

## 2019-03-31 DIAGNOSIS — E538 Deficiency of other specified B group vitamins: Secondary | ICD-10-CM | POA: Diagnosis present

## 2019-03-31 DIAGNOSIS — K3580 Unspecified acute appendicitis: Secondary | ICD-10-CM | POA: Diagnosis present

## 2019-03-31 DIAGNOSIS — Z01810 Encounter for preprocedural cardiovascular examination: Secondary | ICD-10-CM

## 2019-03-31 DIAGNOSIS — Z1159 Encounter for screening for other viral diseases: Secondary | ICD-10-CM

## 2019-03-31 DIAGNOSIS — D509 Iron deficiency anemia, unspecified: Secondary | ICD-10-CM

## 2019-03-31 DIAGNOSIS — Z833 Family history of diabetes mellitus: Secondary | ICD-10-CM

## 2019-03-31 DIAGNOSIS — Z9114 Patient's other noncompliance with medication regimen: Secondary | ICD-10-CM

## 2019-03-31 DIAGNOSIS — Z79899 Other long term (current) drug therapy: Secondary | ICD-10-CM

## 2019-03-31 DIAGNOSIS — I10 Essential (primary) hypertension: Secondary | ICD-10-CM | POA: Diagnosis present

## 2019-03-31 DIAGNOSIS — Z9884 Bariatric surgery status: Secondary | ICD-10-CM

## 2019-03-31 DIAGNOSIS — Z7984 Long term (current) use of oral hypoglycemic drugs: Secondary | ICD-10-CM

## 2019-03-31 DIAGNOSIS — E119 Type 2 diabetes mellitus without complications: Secondary | ICD-10-CM

## 2019-03-31 DIAGNOSIS — K3532 Acute appendicitis with perforation and localized peritonitis, without abscess: Principal | ICD-10-CM | POA: Diagnosis present

## 2019-03-31 LAB — COMPREHENSIVE METABOLIC PANEL
ALT: 10 U/L (ref 0–44)
AST: 14 U/L — ABNORMAL LOW (ref 15–41)
Albumin: 4 g/dL (ref 3.5–5.0)
Alkaline Phosphatase: 61 U/L (ref 38–126)
Anion gap: 10 (ref 5–15)
BUN: 15 mg/dL (ref 6–20)
CO2: 23 mmol/L (ref 22–32)
Calcium: 8.6 mg/dL — ABNORMAL LOW (ref 8.9–10.3)
Chloride: 103 mmol/L (ref 98–111)
Creatinine, Ser: 0.7 mg/dL (ref 0.44–1.00)
GFR calc Af Amer: >60 mL/min (ref >60)
GFR calc non Af Amer: >60 mL/min (ref >60)
Glucose, Bld: 173 mg/dL — ABNORMAL HIGH (ref 70–99)
Potassium: 4 mmol/L (ref 3.5–5.1)
Sodium: 136 mmol/L (ref 135–145)
Total Bilirubin: 0.3 mg/dL (ref 0.3–1.2)
Total Protein: 7.1 g/dL (ref 6.5–8.1)

## 2019-03-31 LAB — CBC
HCT: 24.4 % — ABNORMAL LOW (ref 36.0–46.0)
Hemoglobin: 6.3 g/dL — CL (ref 12.0–15.0)
MCH: 17.2 pg — ABNORMAL LOW (ref 26.0–34.0)
MCHC: 25.8 g/dL — ABNORMAL LOW (ref 30.0–36.0)
MCV: 66.7 fL — ABNORMAL LOW (ref 80.0–100.0)
Platelets: 215 10*3/uL (ref 150–400)
RBC: 3.66 MIL/uL — ABNORMAL LOW (ref 3.87–5.11)
RDW: 19.5 % — ABNORMAL HIGH (ref 11.5–15.5)
WBC: 8.5 10*3/uL (ref 4.0–10.5)
nRBC: 0 % (ref 0.0–0.2)

## 2019-03-31 LAB — I-STAT BETA HCG BLOOD, ED (MC, WL, AP ONLY): I-stat hCG, quantitative: <5 m[IU]/mL (ref <5)

## 2019-03-31 LAB — LIPASE, BLOOD: Lipase: 37 U/L (ref 11–51)

## 2019-03-31 MED ORDER — SODIUM CHLORIDE 0.9 % IV BOLUS
500.0000 mL | Freq: Once | INTRAVENOUS | Status: AC
  Administered 2019-03-31: 500 mL via INTRAVENOUS

## 2019-03-31 MED ORDER — ONDANSETRON HCL 4 MG/2ML IJ SOLN
4.0000 mg | Freq: Once | INTRAMUSCULAR | Status: AC
  Administered 2019-03-31: 4 mg via INTRAVENOUS
  Filled 2019-03-31: qty 2

## 2019-03-31 MED ORDER — PANTOPRAZOLE SODIUM 40 MG IV SOLR
40.0000 mg | Freq: Once | INTRAVENOUS | Status: AC
  Administered 2019-03-31: 40 mg via INTRAVENOUS
  Filled 2019-03-31: qty 40

## 2019-03-31 MED ORDER — SODIUM CHLORIDE 0.9% FLUSH
3.0000 mL | Freq: Once | INTRAVENOUS | Status: AC
  Administered 2019-04-01: 3 mL via INTRAVENOUS

## 2019-03-31 NOTE — ED Notes (Signed)
CRITICAL VALUE ALERT  Critical Value:  Hgb 6.3  Date & Time Notied:  03/31/2019 2332  Provider Notified: Dr. Estell Harpin  Orders Received/Actions taken: see chart

## 2019-03-31 NOTE — ED Triage Notes (Signed)
Pt reports upper abd pain that started 2 days ago. Last BM was today and normal. Pt reports more pain tonight after eating (beef stew). Pt reports nauseated but no vomiting.

## 2019-04-01 ENCOUNTER — Inpatient Hospital Stay (HOSPITAL_COMMUNITY): Payer: Self-pay | Admitting: Anesthesiology

## 2019-04-01 ENCOUNTER — Encounter (HOSPITAL_COMMUNITY): Payer: Self-pay | Admitting: Internal Medicine

## 2019-04-01 ENCOUNTER — Encounter (HOSPITAL_COMMUNITY)
Admission: EM | Disposition: A | Discharge status: Home / Self Care | Payer: Self-pay | Source: Home / Self Care | Attending: General Surgery

## 2019-04-01 ENCOUNTER — Emergency Department (HOSPITAL_COMMUNITY): Payer: Self-pay

## 2019-04-01 ENCOUNTER — Inpatient Hospital Stay (HOSPITAL_COMMUNITY): Payer: Self-pay

## 2019-04-01 DIAGNOSIS — D649 Anemia, unspecified: Secondary | ICD-10-CM | POA: Diagnosis present

## 2019-04-01 DIAGNOSIS — K3580 Unspecified acute appendicitis: Secondary | ICD-10-CM | POA: Diagnosis present

## 2019-04-01 DIAGNOSIS — E538 Deficiency of other specified B group vitamins: Secondary | ICD-10-CM

## 2019-04-01 DIAGNOSIS — D509 Iron deficiency anemia, unspecified: Secondary | ICD-10-CM

## 2019-04-01 DIAGNOSIS — I10 Essential (primary) hypertension: Secondary | ICD-10-CM

## 2019-04-01 HISTORY — PX: LAPAROSCOPIC APPENDECTOMY: SHX408

## 2019-04-01 LAB — CBC WITH DIFFERENTIAL/PLATELET
Abs Immature Granulocytes: 0.05 10*3/uL (ref 0.00–0.07)
Basophils Absolute: 0 10*3/uL (ref 0.0–0.1)
Basophils Relative: 0 %
Eosinophils Absolute: 0 10*3/uL (ref 0.0–0.5)
Eosinophils Relative: 0 %
HCT: 25.4 % — ABNORMAL LOW (ref 36.0–46.0)
Hemoglobin: 7 g/dL — ABNORMAL LOW (ref 12.0–15.0)
Immature Granulocytes: 0 %
Lymphocytes Relative: 6 %
Lymphs Abs: 0.8 10*3/uL (ref 0.7–4.0)
MCH: 19 pg — ABNORMAL LOW (ref 26.0–34.0)
MCHC: 27.6 g/dL — ABNORMAL LOW (ref 30.0–36.0)
MCV: 69 fL — ABNORMAL LOW (ref 80.0–100.0)
Monocytes Absolute: 0.7 10*3/uL (ref 0.1–1.0)
Monocytes Relative: 5 %
Neutro Abs: 11.9 10*3/uL — ABNORMAL HIGH (ref 1.7–7.7)
Neutrophils Relative %: 89 %
Platelets: 162 10*3/uL (ref 150–400)
RBC: 3.68 MIL/uL — ABNORMAL LOW (ref 3.87–5.11)
RDW: 21.7 % — ABNORMAL HIGH (ref 11.5–15.5)
WBC: 13.5 10*3/uL — ABNORMAL HIGH (ref 4.0–10.5)
nRBC: 0 % (ref 0.0–0.2)

## 2019-04-01 LAB — GLUCOSE, CAPILLARY
Glucose-Capillary: 103 mg/dL — ABNORMAL HIGH (ref 70–99)
Glucose-Capillary: 101 mg/dL — ABNORMAL HIGH (ref 70–99)
Glucose-Capillary: 131 mg/dL — ABNORMAL HIGH (ref 70–99)
Glucose-Capillary: 249 mg/dL — ABNORMAL HIGH (ref 70–99)

## 2019-04-01 LAB — COMPREHENSIVE METABOLIC PANEL
ALT: 8 U/L (ref 0–44)
AST: 11 U/L — ABNORMAL LOW (ref 15–41)
Albumin: 3.5 g/dL (ref 3.5–5.0)
Alkaline Phosphatase: 55 U/L (ref 38–126)
Anion gap: 7 (ref 5–15)
BUN: 13 mg/dL (ref 6–20)
CO2: 24 mmol/L (ref 22–32)
Calcium: 8.2 mg/dL — ABNORMAL LOW (ref 8.9–10.3)
Chloride: 107 mmol/L (ref 98–111)
Creatinine, Ser: 0.65 mg/dL (ref 0.44–1.00)
GFR calc Af Amer: >60 mL/min (ref >60)
GFR calc non Af Amer: >60 mL/min (ref >60)
Glucose, Bld: 108 mg/dL — ABNORMAL HIGH (ref 70–99)
Potassium: 3.9 mmol/L (ref 3.5–5.1)
Sodium: 138 mmol/L (ref 135–145)
Total Bilirubin: 2.3 mg/dL — ABNORMAL HIGH (ref 0.3–1.2)
Total Protein: 6.2 g/dL — ABNORMAL LOW (ref 6.5–8.1)

## 2019-04-01 LAB — RETICULOCYTES
Immature Retic Fract: 27.5 % — ABNORMAL HIGH (ref 2.3–15.9)
RBC.: 3.43 MIL/uL — ABNORMAL LOW (ref 3.87–5.11)
Retic Count, Absolute: 63.1 10*3/uL (ref 19.0–186.0)
Retic Ct Pct: 1.8 % (ref 0.4–3.1)

## 2019-04-01 LAB — IRON AND TIBC
Iron: 12 ug/dL — ABNORMAL LOW (ref 28–170)
Saturation Ratios: 3 % — ABNORMAL LOW (ref 10.4–31.8)
TIBC: 435 ug/dL (ref 250–450)
UIBC: 423 ug/dL

## 2019-04-01 LAB — URINALYSIS, ROUTINE W REFLEX MICROSCOPIC
Bilirubin Urine: NEGATIVE
Glucose, UA: 150 mg/dL — AB
Hgb urine dipstick: NEGATIVE
Ketones, ur: NEGATIVE mg/dL
Leukocytes,Ua: NEGATIVE
Nitrite: POSITIVE — AB
Protein, ur: NEGATIVE mg/dL
Specific Gravity, Urine: 1.014 (ref 1.005–1.030)
pH: 6 (ref 5.0–8.0)

## 2019-04-01 LAB — VITAMIN B12: Vitamin B-12: 106 pg/mL — ABNORMAL LOW (ref 180–914)

## 2019-04-01 LAB — FOLATE: Folate: 12.4 ng/mL (ref >5.9)

## 2019-04-01 LAB — SARS CORONAVIRUS 2 BY RT PCR (HOSPITAL ORDER, PERFORMED IN ~~LOC~~ HOSPITAL LAB): SARS Coronavirus 2: NEGATIVE

## 2019-04-01 LAB — PREPARE RBC (CROSSMATCH)

## 2019-04-01 LAB — FERRITIN: Ferritin: 3 ng/mL — ABNORMAL LOW (ref 11–307)

## 2019-04-01 LAB — POC OCCULT BLOOD, ED: Fecal Occult Bld: NEGATIVE

## 2019-04-01 SURGERY — APPENDECTOMY, LAPAROSCOPIC
Anesthesia: General | Site: Abdomen

## 2019-04-01 MED ORDER — SUGAMMADEX SODIUM 200 MG/2ML IV SOLN
INTRAVENOUS | Status: DC | PRN
  Administered 2019-04-01: 200 mg via INTRAVENOUS

## 2019-04-01 MED ORDER — BUPIVACAINE LIPOSOME 1.3 % IJ SUSP
INTRAMUSCULAR | Status: AC
  Filled 2019-04-01: qty 20

## 2019-04-01 MED ORDER — HYDROCODONE-ACETAMINOPHEN 7.5-325 MG PO TABS
1.0000 | ORAL_TABLET | Freq: Once | ORAL | Status: DC | PRN
  Filled 2019-04-01: qty 1

## 2019-04-01 MED ORDER — SIMETHICONE 80 MG PO CHEW: 40.0000 mg | CHEWABLE_TABLET | Freq: Four times a day (QID) | ORAL | Status: DC | PRN

## 2019-04-01 MED ORDER — ACETAMINOPHEN 650 MG RE SUPP: 650.0000 mg | Freq: Four times a day (QID) | RECTAL | Status: DC | PRN

## 2019-04-01 MED ORDER — CYANOCOBALAMIN 1000 MCG/ML IJ SOLN
1000.0000 ug | Freq: Every day | INTRAMUSCULAR | Status: AC
  Administered 2019-04-01 – 2019-04-02 (×2): 1000 ug via INTRAMUSCULAR
  Filled 2019-04-01 (×2): qty 1

## 2019-04-01 MED ORDER — SODIUM CHLORIDE 0.9 % IV SOLN
INTRAVENOUS | Status: DC
  Administered 2019-04-01: 19:00:00 via INTRAVENOUS

## 2019-04-01 MED ORDER — MEPERIDINE HCL 50 MG/ML IJ SOLN: 6.2500 mg | INTRAMUSCULAR | Status: DC | PRN

## 2019-04-01 MED ORDER — KETOROLAC TROMETHAMINE 30 MG/ML IJ SOLN: 30.0000 mg | Freq: Four times a day (QID) | INTRAMUSCULAR | Status: DC | PRN

## 2019-04-01 MED ORDER — HEMOSTATIC AGENTS (NO CHARGE) OPTIME
TOPICAL | Status: DC | PRN
  Administered 2019-04-01 (×2): 1 via TOPICAL

## 2019-04-01 MED ORDER — HYDROMORPHONE HCL 1 MG/ML IJ SOLN: 0.2500 mg | INTRAMUSCULAR | Status: DC | PRN

## 2019-04-01 MED ORDER — BUPIVACAINE LIPOSOME 1.3 % IJ SUSP
INTRAMUSCULAR | Status: DC | PRN
  Administered 2019-04-01: 20 mL

## 2019-04-01 MED ORDER — HYDROMORPHONE HCL 1 MG/ML IJ SOLN: 1.0000 mg | INTRAMUSCULAR | Status: DC | PRN

## 2019-04-01 MED ORDER — MIDAZOLAM HCL 2 MG/2ML IJ SOLN
INTRAMUSCULAR | Status: AC
  Filled 2019-04-01: qty 2

## 2019-04-01 MED ORDER — FENTANYL CITRATE (PF) 250 MCG/5ML IJ SOLN
INTRAMUSCULAR | Status: AC
  Filled 2019-04-01: qty 5

## 2019-04-01 MED ORDER — PROMETHAZINE HCL 25 MG/ML IJ SOLN
6.2500 mg | INTRAMUSCULAR | Status: DC | PRN
  Administered 2019-04-01: 12.5 mg via INTRAVENOUS
  Filled 2019-04-01: qty 1

## 2019-04-01 MED ORDER — ROCURONIUM BROMIDE 10 MG/ML (PF) SYRINGE
PREFILLED_SYRINGE | INTRAVENOUS | Status: AC
  Filled 2019-04-01: qty 10

## 2019-04-01 MED ORDER — ROCURONIUM BROMIDE 10 MG/ML (PF) SYRINGE
PREFILLED_SYRINGE | INTRAVENOUS | Status: DC | PRN
  Administered 2019-04-01: 40 mg via INTRAVENOUS

## 2019-04-01 MED ORDER — CHLORHEXIDINE GLUCONATE CLOTH 2 % EX PADS: 6.0000 | MEDICATED_PAD | Freq: Once | CUTANEOUS | Status: DC

## 2019-04-01 MED ORDER — SODIUM CHLORIDE 0.9 % IV SOLN: 10.0000 mL/h | Freq: Once | INTRAVENOUS | Status: AC

## 2019-04-01 MED ORDER — IOHEXOL 300 MG/ML  SOLN
100.0000 mL | Freq: Once | INTRAMUSCULAR | Status: AC | PRN
  Administered 2019-04-01: 100 mL via INTRAVENOUS

## 2019-04-01 MED ORDER — PANTOPRAZOLE SODIUM 40 MG IV SOLR
40.0000 mg | INTRAVENOUS | Status: DC
  Administered 2019-04-01: 40 mg via INTRAVENOUS
  Filled 2019-04-01: qty 40

## 2019-04-01 MED ORDER — PROPOFOL 10 MG/ML IV BOLUS
INTRAVENOUS | Status: DC | PRN
  Administered 2019-04-01: 170 mg via INTRAVENOUS

## 2019-04-01 MED ORDER — SODIUM CHLORIDE 0.9 % IV SOLN
510.0000 mg | Freq: Once | INTRAVENOUS | Status: AC
  Administered 2019-04-01: 510 mg via INTRAVENOUS
  Filled 2019-04-01: qty 17

## 2019-04-01 MED ORDER — HYDROMORPHONE HCL 1 MG/ML IJ SOLN
1.0000 mg | Freq: Once | INTRAMUSCULAR | Status: AC
Start: 2019-04-01 — End: 2019-04-01
  Administered 2019-04-01: 1 mg via INTRAVENOUS
  Filled 2019-04-01: qty 1

## 2019-04-01 MED ORDER — SODIUM CHLORIDE 0.9% IV SOLUTION
Freq: Once | INTRAVENOUS | Status: AC
  Administered 2019-04-01: 1 mL via INTRAVENOUS

## 2019-04-01 MED ORDER — PIPERACILLIN-TAZOBACTAM 3.375 G IVPB 30 MIN
3.3750 g | Freq: Once | INTRAVENOUS | Status: AC
  Administered 2019-04-01: 02:00:00 3.375 g via INTRAVENOUS
  Filled 2019-04-01: qty 50

## 2019-04-01 MED ORDER — ACETAMINOPHEN 325 MG PO TABS: 650.0000 mg | ORAL_TABLET | Freq: Four times a day (QID) | ORAL | Status: DC | PRN

## 2019-04-01 MED ORDER — OXYCODONE-ACETAMINOPHEN 7.5-325 MG PO TABS: 1.0000 | ORAL_TABLET | Freq: Four times a day (QID) | ORAL | 0 refills | Status: AC | PRN

## 2019-04-01 MED ORDER — HYDROCODONE-ACETAMINOPHEN 5-325 MG PO TABS
1.0000 | ORAL_TABLET | ORAL | Status: DC | PRN
  Administered 2019-04-02: 1 via ORAL
  Filled 2019-04-01: qty 1

## 2019-04-01 MED ORDER — ONDANSETRON HCL 4 MG/2ML IJ SOLN: 4.0000 mg | Freq: Four times a day (QID) | INTRAMUSCULAR | Status: DC | PRN

## 2019-04-01 MED ORDER — ENOXAPARIN SODIUM 40 MG/0.4ML ~~LOC~~ SOLN
40.0000 mg | SUBCUTANEOUS | Status: DC
  Administered 2019-04-02: 40 mg via SUBCUTANEOUS
  Filled 2019-04-01: qty 0.4

## 2019-04-01 MED ORDER — PROPOFOL 10 MG/ML IV BOLUS
INTRAVENOUS | Status: AC
  Filled 2019-04-01: qty 20

## 2019-04-01 MED ORDER — HYDROMORPHONE HCL 1 MG/ML IJ SOLN
1.0000 mg | INTRAMUSCULAR | Status: DC | PRN
  Administered 2019-04-01 (×2): 1 mg via INTRAVENOUS
  Filled 2019-04-01 (×3): qty 1

## 2019-04-01 MED ORDER — ONDANSETRON HCL 4 MG/2ML IJ SOLN
INTRAMUSCULAR | Status: DC | PRN
  Administered 2019-04-01: 4 mg via INTRAVENOUS

## 2019-04-01 MED ORDER — DEXAMETHASONE SODIUM PHOSPHATE 10 MG/ML IJ SOLN
INTRAMUSCULAR | Status: AC
  Filled 2019-04-01: qty 1

## 2019-04-01 MED ORDER — MIDAZOLAM HCL 5 MG/5ML IJ SOLN
INTRAMUSCULAR | Status: DC | PRN
  Administered 2019-04-01: 2 mg via INTRAVENOUS

## 2019-04-01 MED ORDER — KETOROLAC TROMETHAMINE 30 MG/ML IJ SOLN
30.0000 mg | Freq: Four times a day (QID) | INTRAMUSCULAR | Status: AC
  Administered 2019-04-01: 30 mg via INTRAVENOUS
  Filled 2019-04-01: qty 1

## 2019-04-01 MED ORDER — 0.9 % SODIUM CHLORIDE (POUR BTL) OPTIME
TOPICAL | Status: DC | PRN
  Administered 2019-04-01: 1000 mL

## 2019-04-01 MED ORDER — LACTATED RINGERS IV SOLN: INTRAVENOUS | Status: DC

## 2019-04-01 MED ORDER — PIPERACILLIN-TAZOBACTAM 3.375 G IVPB
3.3750 g | Freq: Three times a day (TID) | INTRAVENOUS | Status: DC
  Administered 2019-04-01 – 2019-04-02 (×4): 3.375 g via INTRAVENOUS
  Filled 2019-04-01 (×4): qty 50

## 2019-04-01 MED ORDER — ONDANSETRON HCL 4 MG/2ML IJ SOLN
INTRAMUSCULAR | Status: AC
  Filled 2019-04-01: qty 2

## 2019-04-01 MED ORDER — FENTANYL CITRATE (PF) 100 MCG/2ML IJ SOLN
INTRAMUSCULAR | Status: DC | PRN
  Administered 2019-04-01: 150 ug via INTRAVENOUS
  Administered 2019-04-01: 100 ug via INTRAVENOUS

## 2019-04-01 MED ORDER — DEXAMETHASONE SODIUM PHOSPHATE 10 MG/ML IJ SOLN
INTRAMUSCULAR | Status: DC | PRN
  Administered 2019-04-01: 5 mg via INTRAVENOUS

## 2019-04-01 MED ORDER — SODIUM CHLORIDE 0.9 % IV SOLN
INTRAVENOUS | Status: DC | PRN
  Administered 2019-04-01: 500 mL via INTRAVENOUS

## 2019-04-01 MED ORDER — ONDANSETRON HCL 4 MG PO TABS: 4.0000 mg | ORAL_TABLET | Freq: Four times a day (QID) | ORAL | Status: DC | PRN

## 2019-04-01 MED ORDER — LACTATED RINGERS IV SOLN
INTRAVENOUS | Status: DC | PRN
  Administered 2019-04-01: 12:00:00 via INTRAVENOUS

## 2019-04-01 SURGICAL SUPPLY — 54 items
APPLICATOR ARISTA FLEXITIP XL (MISCELLANEOUS) ×2 IMPLANT
BAG RETRIEVAL 10 (BASKET) ×1
BAG RETRIEVAL 10MM (BASKET) ×1
CHLORAPREP W/TINT 26 (MISCELLANEOUS) ×3 IMPLANT
CLOTH BEACON ORANGE TIMEOUT ST (SAFETY) ×3 IMPLANT
COVER LIGHT HANDLE STERIS (MISCELLANEOUS) ×6 IMPLANT
CUTTER FLEX LINEAR 45M (STAPLE) ×3 IMPLANT
DECANTER SPIKE VIAL GLASS SM (MISCELLANEOUS) ×3 IMPLANT
DERMABOND ADVANCED (GAUZE/BANDAGES/DRESSINGS) ×2
DERMABOND ADVANCED .7 DNX12 (GAUZE/BANDAGES/DRESSINGS) IMPLANT
ELECT REM PT RETURN 9FT ADLT (ELECTROSURGICAL) ×3
ELECTRODE REM PT RTRN 9FT ADLT (ELECTROSURGICAL) ×1 IMPLANT
EVACUATOR SMOKE 8.L (FILTER) ×3 IMPLANT
GLOVE BIOGEL PI IND STRL 7.0 (GLOVE) ×2 IMPLANT
GLOVE BIOGEL PI INDICATOR 7.0 (GLOVE) ×4
GLOVE SURG SS PI 7.5 STRL IVOR (GLOVE) ×3 IMPLANT
GOWN STRL REUS W/ TWL XL LVL3 (GOWN DISPOSABLE) ×1 IMPLANT
GOWN STRL REUS W/TWL LRG LVL3 (GOWN DISPOSABLE) ×3 IMPLANT
GOWN STRL REUS W/TWL XL LVL3 (GOWN DISPOSABLE) ×2
HEMOSTAT ARISTA ABSORB 1G (HEMOSTASIS) ×2 IMPLANT
HEMOSTAT SURGICEL 4X8 (HEMOSTASIS) ×2 IMPLANT
INST SET LAPROSCOPIC AP (KITS) ×3 IMPLANT
IV NS IRRIG 3000ML ARTHROMATIC (IV SOLUTION) IMPLANT
KIT TURNOVER KIT A (KITS) ×3 IMPLANT
MANIFOLD NEPTUNE II (INSTRUMENTS) ×3 IMPLANT
NDL HYPO 18GX1.5 BLUNT FILL (NEEDLE) ×1 IMPLANT
NDL INSUFFLATION 14GA 120MM (NEEDLE) ×1 IMPLANT
NEEDLE HYPO 18GX1.5 BLUNT FILL (NEEDLE) ×3 IMPLANT
NEEDLE HYPO 22GX1.5 SAFETY (NEEDLE) ×3 IMPLANT
NEEDLE INSUFFLATION 14GA 120MM (NEEDLE) ×3 IMPLANT
NS IRRIG 1000ML POUR BTL (IV SOLUTION) ×3 IMPLANT
PACK LAP CHOLE LZT030E (CUSTOM PROCEDURE TRAY) ×3 IMPLANT
PAD ARMBOARD 7.5X6 YLW CONV (MISCELLANEOUS) ×3 IMPLANT
PENCIL HANDSWITCHING (ELECTRODE) ×3 IMPLANT
RELOAD 45 VASCULAR/THIN (ENDOMECHANICALS) IMPLANT
RELOAD STAPLE 45 2.5 WHT GRN (ENDOMECHANICALS) IMPLANT
RELOAD STAPLE 45 3.5 BLU ETS (ENDOMECHANICALS) IMPLANT
RELOAD STAPLE TA45 3.5 REG BLU (ENDOMECHANICALS) ×3 IMPLANT
SET BASIN LINEN APH (SET/KITS/TRAYS/PACK) ×3 IMPLANT
SET TUBE IRRIG SUCTION NO TIP (IRRIGATION / IRRIGATOR) IMPLANT
SHEARS HARMONIC ACE PLUS 36CM (ENDOMECHANICALS) ×3 IMPLANT
SUT MNCRL AB 4-0 PS2 18 (SUTURE) ×2 IMPLANT
SUT VICRYL 0 UR6 27IN ABS (SUTURE) ×3 IMPLANT
SYR 20CC LL (SYRINGE) ×6 IMPLANT
SYS BAG RETRIEVAL 10MM (BASKET) ×1
SYSTEM BAG RETRIEVAL 10MM (BASKET) ×1 IMPLANT
TRAY FOLEY W/BAG SLVR 16FR (SET/KITS/TRAYS/PACK) ×2
TRAY FOLEY W/BAG SLVR 16FR ST (SET/KITS/TRAYS/PACK) ×1 IMPLANT
TROCAR ENDO BLADELESS 11MM (ENDOMECHANICALS) ×3 IMPLANT
TROCAR ENDO BLADELESS 12MM (ENDOMECHANICALS) ×3 IMPLANT
TROCAR XCEL NON-BLD 5MMX100MML (ENDOMECHANICALS) ×3 IMPLANT
TUBING INSUFFLATION (TUBING) ×3 IMPLANT
WARMER LAPAROSCOPE (MISCELLANEOUS) ×3 IMPLANT
YANKAUER SUCT 12FT TUBE ARGYLE (SUCTIONS) ×3 IMPLANT

## 2019-04-01 NOTE — Transfer of Care (Signed)
Immediate Anesthesia Transfer of Care Note  Patient: Norma Carroll  Procedure(s) Performed: APPENDECTOMY LAPAROSCOPIC (N/A Abdomen)  Patient Location: PACU  Anesthesia Type:General  Level of Consciousness: awake, alert  and oriented  Airway & Oxygen Therapy: Patient Spontanous Breathing and Patient connected to face mask oxygen  Post-op Assessment: Report given to RN and Post -op Vital signs reviewed and stable  Post vital signs: Reviewed and stable  Last Vitals:  Vitals Value Taken Time  BP    Temp    Pulse 83 04/01/2019  1:09 PM  Resp 22 04/01/2019  1:09 PM  SpO2 97 % 04/01/2019  1:09 PM  Vitals shown include unvalidated device data.  Last Pain:  Vitals:   04/01/19 1106  TempSrc: Oral  PainSc: 4       Patients Stated Pain Goal: 4 (04/01/19 1106)  Complications: No apparent anesthesia complications

## 2019-04-01 NOTE — Anesthesia Procedure Notes (Signed)
Procedure Name: Intubation Date/Time: 04/01/2019 12:20 PM Performed by: Jonna Munro, CRNA Pre-anesthesia Checklist: Patient identified, Emergency Drugs available and Suction available Patient Re-evaluated:Patient Re-evaluated prior to induction Oxygen Delivery Method: Circle system utilized Preoxygenation: Pre-oxygenation with 100% oxygen Induction Type: IV induction Ventilation: Mask ventilation without difficulty Laryngoscope Size: Mac and 3 Grade View: Grade I Tube type: Oral Tube size: 7.0 mm Number of attempts: 1 Airway Equipment and Method: Stylet Placement Confirmation: ETT inserted through vocal cords under direct vision and breath sounds checked- equal and bilateral Secured at: 22 cm Tube secured with: Tape Dental Injury: Teeth and Oropharynx as per pre-operative assessment

## 2019-04-01 NOTE — Consult Note (Signed)
Reason for Consult: Acute appendicitis Referring Physician: Dr. Claudie Reveringat  Norma Georgena SpurlingJefferson Schloesser is an 48 y.o. female.  HPI: Patient is a 48 year old white female with a history of type 2 diabetes, anemia, status post gastric bypass in the past who presents with a 2-day history of worsening right lower quadrant abdominal pain.  CT scan of the abdomen reveals acute appendicitis.  Interestingly, the patient was treated for perforated appendicitis with antibiotics in 2017.  She currently has a pain level of 4 out of 10.  She was admitted to the hospital earlier today for IV antibiotics.  Past Medical History:  Diagnosis Date  . Anemia   . Blood transfusion without reported diagnosis 2007   after gastric bypass surgery  . Diabetes mellitus without complication (HCC) 2003  . Hypertension 2008    Past Surgical History:  Procedure Laterality Date  . LAPAROSCOPIC GASTRIC BYPASS  2007    Family History  Problem Relation Age of Onset  . Heart attack Father   . Hypertension Father   . Diabetes Father     Social History:  reports that she has never smoked. She has never used smokeless tobacco. She reports that she does not drink alcohol or use drugs.  Allergies: No Known Allergies  Medications: I have reviewed the patient's current medications.  Results for orders placed or performed during the hospital encounter of 03/31/19 (from the past 48 hour(s))  Vitamin B12     Status: Abnormal   Collection Time: 03/31/19 10:40 PM  Result Value Ref Range   Vitamin B-12 106 (L) 180 - 914 pg/mL    Comment: (NOTE) This assay is not validated for testing neonatal or myeloproliferative syndrome specimens for Vitamin B12 levels. Performed at [facility], [address] [place], [place] [address]   Folate     Status: None   Collection Time: 03/31/19 10:40 PM  Result Value Ref Range   Folate 12.4 >5.9 ng/mL    Comment: Performed at [facility], [address] [place], [place] [address]  Iron and TIBC      Status: Abnormal   Collection Time: 03/31/19 10:40 PM  Result Value Ref Range   Iron 12 (L) 28 - 170 ug/dL   TIBC 098435 119250 - 147450 ug/dL   Saturation Ratios 3 (L) 10.4 - 31.8 %   UIBC 423 ug/dL    Comment: Performed at [facility], [address] [place], [place] [address]  Ferritin     Status: Abnormal   Collection Time: 03/31/19 10:40 PM  Result Value Ref Range   Ferritin 3 (L) 11 - 307 ng/mL    Comment: Performed at [facility], [address] [place], [place] [address]  Lipase, blood     Status: None   Collection Time: 03/31/19 10:52 PM  Result Value Ref Range   Lipase 37 11 - 51 U/L    Comment: Performed at [facility], [address] [place], [place] [address]  Comprehensive metabolic panel     Status: Abnormal   Collection Time: 03/31/19 10:52 PM  Result Value Ref Range   Sodium 136 135 - 145 mmol/L   Potassium 4.0 3.5 - 5.1 mmol/L   Chloride 103 98 - 111 mmol/L   CO2 23 22 - 32 mmol/L   Glucose, Bld 173 (H) 70 - 99 mg/dL   BUN 15 6 - 20 mg/dL   Creatinine, Ser 6.960.70 0.44 - 1.00 mg/dL   Calcium 8.6 (L) 8.9 - 10.3 mg/dL   Total Protein 7.1  6.5 - 8.1 g/dL   Albumin 4.0 3.5 - 5.0 g/dL   AST 14 (L) 15 - 41 U/L   ALT 10 0 - 44 U/L   Alkaline Phosphatase 61 38 - 126 U/L   Total Bilirubin 0.3 0.3 - 1.2 mg/dL   GFR calc non Af Amer >60 >60 mL/min   GFR calc Af Amer >60 >60 mL/min   Anion gap 10 5 - 15    Comment: Performed at [facility], [address], [place], [place] [address]  CBC     Status: Abnormal   Collection Time: 03/31/19 10:52 PM  Result Value Ref Range   WBC 8.5 4.0 - 10.5 K/uL   RBC 3.66 (L) 3.87 - 5.11 MIL/uL   Hemoglobin 6.3 (LL) 12.0 - 15.0 g/dL    Comment: Reticulocyte Hemoglobin testing may be clinically indicated, consider ordering this additional test [id number]    HCT 24.4 (L) 36.0 - 46.0 %   MCV 66.7 (L) 80.0 - 100.0 fL   MCH 17.2 (L) 26.0 - 34.0 pg   MCHC 25.8 (L) 30.0 - 36.0 g/dL   RDW 67.2 (H) 09.4 - 70.9 %   Platelets 215 150 - 400  K/uL   nRBC 0.0 0.0 - 0.2 %    Comment: Performed at [facility], [address] [place], [place] [address]  Urinalysis, Routine w reflex microscopic     Status: Abnormal   Collection Time: 03/31/19 11:30 PM  Result Value Ref Range   Color, Urine AMBER (A) YELLOW    Comment: BIOCHEMICALS MAY BE AFFECTED BY COLOR   APPearance CLEAR CLEAR   Specific Gravity, Urine 1.014 1.005 - 1.030   pH 6.0 5.0 - 8.0   Glucose, UA 150 (A) NEGATIVE mg/dL   Hgb urine dipstick NEGATIVE NEGATIVE   Bilirubin Urine NEGATIVE NEGATIVE   Ketones, ur NEGATIVE NEGATIVE mg/dL   Protein, ur NEGATIVE NEGATIVE mg/dL   Nitrite POSITIVE (A) NEGATIVE   Leukocytes,Ua NEGATIVE NEGATIVE   RBC / HPF 0-5 0 - 5 RBC/hpf   WBC, UA 0-5 0 - 5 WBC/hpf   Bacteria, UA FEW (A) NONE SEEN   Squamous Epithelial / LPF 0-5 0 - 5   Mucus PRESENT     Comment: Performed at [facility], [address], [place], [place] [address]  I-Stat Beta hCG blood, ED ([facility], [facility], [facility] only)     Status: None   Collection Time: 03/31/19 11:31 PM  Result Value Ref Range   I-stat hCG, quantitative <5.0 <5 mIU/mL   Comment 3            Comment:   GEST. AGE      CONC.  (mIU/mL)   <=1 WEEK        5 - 50     2 WEEKS       50 - 500     3 WEEKS       100 - 10,000     4 WEEKS     1,000 - 30,000        FEMALE AND NON-PREGNANT FEMALE:     LESS THAN 5 mIU/mL   POC occult blood, ED     Status: None   Collection Time: 04/01/19 12:39 AM  Result Value Ref Range   Fecal Occult Bld NEGATIVE NEGATIVE  SARS Coronavirus 2 (CEPHEID - Performed in [facility] hospital lab), Hosp Order     Status: None   Collection Time: 04/01/19  1:46 AM  Result Value Ref Range  SARS Coronavirus 2 NEGATIVE NEGATIVE    Comment: (NOTE) If result is NEGATIVE SARS-CoV-2 target nucleic acids are NOT DETECTED. The SARS-CoV-2 RNA is generally detectable in upper and lower  respiratory specimens during the acute phase of infection. The lowest  concentration of SARS-CoV-2 viral copies this  assay can detect is 250  copies / mL. A negative result does not preclude SARS-CoV-2 infection  and should not be used as the sole basis for treatment or other  patient management decisions.  A negative result may occur with  improper specimen collection / handling, submission of specimen other  than nasopharyngeal swab, presence of viral mutation(s) within the  areas targeted by this assay, and inadequate number of viral copies  (<250 copies / mL). A negative result must be combined with clinical  observations, patient history, and epidemiological information. If result is POSITIVE SARS-CoV-2 target nucleic acids are DETECTED. The SARS-CoV-2 RNA is generally detectable in upper and lower  respiratory specimens dur ing the acute phase of infection.  Positive  results are indicative of active infection with SARS-CoV-2.  Clinical  correlation with patient history and other diagnostic information is  necessary to determine patient infection status.  Positive results do  not rule out bacterial infection or co-infection with other viruses. If result is PRESUMPTIVE POSTIVE SARS-CoV-2 nucleic acids MAY BE PRESENT.   A presumptive positive result was obtained on the submitted specimen  and confirmed on repeat testing.  While 2019 novel coronavirus  (SARS-CoV-2) nucleic acids may be present in the submitted sample  additional confirmatory testing may be necessary for epidemiological  and / or clinical management purposes  to differentiate between  SARS-CoV-2 and other Sarbecovirus currently known to infect humans.  If clinically indicated additional testing with an alternate test  methodology [phone]) is advised. The SARS-CoV-2 RNA is generally  detectable in upper and lower respiratory sp ecimens during the acute  phase of infection. The expected result is Negative. Fact Sheet for Patients:  BoilerBrush.com.cy Fact Sheet for Healthcare  Providers: https://pope.com/ This test is not yet approved or cleared by the [place] FDA and has been authorized for detection and/or diagnosis of SARS-CoV-2 by FDA under an Emergency Use Authorization (EUA).  This EUA will remain in effect (meaning this test can be used) for the duration of the COVID-19 declaration under Section 564(b)(1) of the Act, 21 U.S.C. section 360bbb-3(b)(1), unless the authorization is terminated or revoked sooner. Performed at [facility], [address], [place], [place] [address]   Type and screen     Status: None (Preliminary result)   Collection Time: 04/01/19  2:39 AM  Result Value Ref Range   ABO/RH(D) A POS    Antibody Screen NEG    Sample Expiration 04/04/2019,2359    Unit Number [id number]    Blood Component Type RED CELLS,LR    Unit division 00    Status of Unit ISSUED    Transfusion Status OK TO TRANSFUSE    Crossmatch Result Compatible    Unit Number [id number]    Blood Component Type RED CELLS,LR    Unit division 00    Status of Unit ISSUED    Transfusion Status OK TO TRANSFUSE    Crossmatch Result      Compatible Performed at [facility], [address], [place], [place] [address]   Prepare RBC     Status: None   Collection Time: 04/01/19  2:39 AM  Result Value Ref Range   Order Confirmation  ORDER PROCESSED BY BLOOD BANK Performed at [facility], [address] [place], [place] [address]   Reticulocytes     Status: Abnormal   Collection Time: 04/01/19  2:39 AM  Result Value Ref Range   Retic Ct Pct 1.8 0.4 - 3.1 %   RBC. 3.43 (L) 3.87 - 5.11 MIL/uL   Retic Count, Absolute 63.1 19.0 - 186.0 K/uL   Immature Retic Fract 27.5 (H) 2.3 - 15.9 %    Comment: Performed at [facility], [address], [place], [place] [address]  Glucose, capillary     Status: Abnormal   Collection Time: 04/01/19  6:25 AM  Result Value Ref Range   Glucose-Capillary 103 (H) 70 - 99 mg/dL    Ct Abdomen Pelvis W  Contrast  Result Date: 04/01/2019 CLINICAL DATA:  48 year old female with 2 days of abdominal pain and distension. Postprandial pain tonight. EXAM: CT ABDOMEN AND PELVIS WITH CONTRAST TECHNIQUE: Multidetector CT imaging of the abdomen and pelvis was performed using the standard protocol following bolus administration of intravenous contrast. CONTRAST:  OMNIPAQUE IOHEXOL 300 MG/ML  SOLN COMPARISON:  CT Abdomen and Pelvis 07/16/2016. FINDINGS: Lower chest: Mild cardiomegaly. No pericardial effusion. Negative lung bases aside from costophrenic angle atelectasis. Hepatobiliary: Mildly distended gallbladder. No pericholecystic inflammation. Negative liver. Pancreas: Negative. Spleen: Negative. Adrenals/Urinary Tract: Normal adrenal glands. Bilateral renal enhancement and contrast excretion is symmetric and within normal limits. Punctate right nephrolithiasis. Unremarkable urinary bladder. Stomach/Bowel: Negative rectosigmoid colon aside from sigmoid redundancy. Negative left colon. Mildly redundant transverse colon is negative. Negative ascending colon. Thickened and inflamed appendix which courses medially from the cecum on coronal image 49 with regional inflammatory stranding. Appendix: Location: Tracks medial from the cecum on series 2, image 62. Diameter: Up to 12 millimeters Appendicolith: Not identified Mucosal hyper-enhancement: Mild Extraluminal gas: Negative. Periappendiceal collection: Trace free fluid only. The terminal ileum and ileocecal valve are demonstrated on coronal image 47. There is mild if any secondary inflammation of the TI. Fluid-filled but nondilated small bowel in the lower abdomen and pelvis. Trace right lower quadrant free fluid. Prior gastric bypass. There is a small bowel anastomosis in the left upper quadrant with no adverse features. No free air. Vascular/Lymphatic: Mild Calcified aortic atherosclerosis. Major arterial structures are patent. Portal venous system is patent. No  lymphadenopathy. Reproductive: Negative; physiologic left ovarian cysts suspected with simple fluid density on series 2, image 78. Other: No pelvic free fluid. 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 Stable mild L5-S1 spondylolisthesis. No acute osseous abnormality identified. IMPRESSION: 1. Positive for Acute Appendicitis. Trace free fluid without abscess or evidence of perforation. 2. Mild secondary inflammation of the distal small bowel. Prior gastric bypass. 3. Mild cardiomegaly. Electronically Signed   By: Odessa Fleming M.D.   On: 04/01/2019 01:24    ROS:  Pertinent items are noted in HPI.  Blood pressure (!) 111/55, pulse 82, temperature 99 F (37.2 C), temperature source Oral, resp. rate 16, height  (1.778 m), weight 96.1 kg, last menstrual period 03/22/2019, SpO2 95 %. Physical Exam: Pleasant mildly obese white female no acute distress Head is normocephalic, atraumatic Lungs clear to auscultation with your breath sounds bilaterally Heart examination reveals regular rate and rhythm without S3, S4, murmurs Abdomen is soft with tenderness no in the right lower quadrant to palpation.  No rigidity is noted.  CT scan images personally reviewed  Assessment/Plan: Impression: Acute appendicitis, chronic anemia Plan: Patient will receive 2 units  of packed red blood cells.  She subsequently undergo a laparoscopic appendectomy.  The risks and benefits of the procedure including bleeding, infection, the possibility of an open procedure, and the possibility of a partial colectomy were fully explained to the patient, who gave informed consent.  Franky Macho 04/01/2019, 7:32 AM

## 2019-04-01 NOTE — Interval H&P Note (Signed)
History and Physical Interval Note:  04/01/2019 11:58 AM  Norma Carroll  has presented today for surgery, with the diagnosis of acute appendicitis.  The various methods of treatment have been discussed with the patient and family. After consideration of risks, benefits and other options for treatment, the patient has consented to  Procedure(s): APPENDECTOMY LAPAROSCOPIC (N/A) as a surgical intervention.  The patient's history has been reviewed, patient examined, no change in status, stable for surgery.  I have reviewed the patient's chart and labs.  Questions were answered to the patient's satisfaction.     Franky Macho

## 2019-04-01 NOTE — H&P (View-Only) (Signed)
Reason for Consult: Acute appendicitis Referring Physician: Dr. Tat  Norma Carroll is an 48 y.o. female.  HPI: Patient is a 48-year-old white female with a history of type 2 diabetes, anemia, status post gastric bypass in the past who presents with a 2-day history of worsening right lower quadrant abdominal pain.  CT scan of the abdomen reveals acute appendicitis.  Interestingly, the patient was treated for perforated appendicitis with antibiotics in 2017.  She currently has a pain level of 4 out of 10.  She was admitted to the hospital earlier today for IV antibiotics.  Past Medical History:  Diagnosis Date  . Anemia   . Blood transfusion without reported diagnosis 2007   after gastric bypass surgery  . Diabetes mellitus without complication (HCC) 2003  . Hypertension 2008    Past Surgical History:  Procedure Laterality Date  . LAPAROSCOPIC GASTRIC BYPASS  2007    Family History  Problem Relation Age of Onset  . Heart attack Father   . Hypertension Father   . Diabetes Father     Social History:  reports that she has never smoked. She has never used smokeless tobacco. She reports that she does not drink alcohol or use drugs.  Allergies: No Known Allergies  Medications: I have reviewed the patient's current medications.  Results for orders placed or performed during the hospital encounter of 03/31/19 (from the past 48 hour(s))  Vitamin B12     Status: Abnormal   Collection Time: 03/31/19 10:40 PM  Result Value Ref Range   Vitamin B-12 106 (L) 180 - 914 pg/mL    Comment: (NOTE) This assay is not validated for testing neonatal or myeloproliferative syndrome specimens for Vitamin B12 levels. Performed at [facility], [address] [place], [place] [address]   Folate     Status: None   Collection Time: 03/31/19 10:40 PM  Result Value Ref Range   Folate 12.4 >5.9 ng/mL    Comment: Performed at [facility], [address] [place], [place] [address]  Iron and TIBC      Status: Abnormal   Collection Time: 03/31/19 10:40 PM  Result Value Ref Range   Iron 12 (L) 28 - 170 ug/dL   TIBC 435 250 - 450 ug/dL   Saturation Ratios 3 (L) 10.4 - 31.8 %   UIBC 423 ug/dL    Comment: Performed at [facility], [address] [place], [place] [address]  Ferritin     Status: Abnormal   Collection Time: 03/31/19 10:40 PM  Result Value Ref Range   Ferritin 3 (L) 11 - 307 ng/mL    Comment: Performed at [facility], [address] [place], [place] [address]  Lipase, blood     Status: None   Collection Time: 03/31/19 10:52 PM  Result Value Ref Range   Lipase 37 11 - 51 U/L    Comment: Performed at [facility], [address] [place], [place] [address]  Comprehensive metabolic panel     Status: Abnormal   Collection Time: 03/31/19 10:52 PM  Result Value Ref Range   Sodium 136 135 - 145 mmol/L   Potassium 4.0 3.5 - 5.1 mmol/L   Chloride 103 98 - 111 mmol/L   CO2 23 22 - 32 mmol/L   Glucose, Bld 173 (H) 70 - 99 mg/dL   BUN 15 6 - 20 mg/dL   Creatinine, Ser 0.70 0.44 - 1.00 mg/dL   Calcium 8.6 (L) 8.9 - 10.3 mg/dL   Total Protein 7.1   6.5 - 8.1 g/dL   Albumin 4.0 3.5 - 5.0 g/dL   AST 14 (L) 15 - 41 U/L   ALT 10 0 - 44 U/L   Alkaline Phosphatase 61 38 - 126 U/L   Total Bilirubin 0.3 0.3 - 1.2 mg/dL   GFR calc non Af Amer >60 >60 mL/min   GFR calc Af Amer >60 >60 mL/min   Anion gap 10 5 - 15    Comment: Performed at [facility], [address] [place], [place] [address]  CBC     Status: Abnormal   Collection Time: 03/31/19 10:52 PM  Result Value Ref Range   WBC 8.5 4.0 - 10.5 K/uL   RBC 3.66 (L) 3.87 - 5.11 MIL/uL   Hemoglobin 6.3 (LL) 12.0 - 15.0 g/dL    Comment: Reticulocyte Hemoglobin testing may be clinically indicated, consider ordering this additional test [id number]    HCT 24.4 (L) 36.0 - 46.0 %   MCV 66.7 (L) 80.0 - 100.0 fL   MCH 17.2 (L) 26.0 - 34.0 pg   MCHC 25.8 (L) 30.0 - 36.0 g/dL   RDW 19.5 (H) 11.5 - 15.5 %   Platelets 215 150 - 400  K/uL   nRBC 0.0 0.0 - 0.2 %    Comment: Performed at [facility], [address] [place], [place] [address]  Urinalysis, Routine w reflex microscopic     Status: Abnormal   Collection Time: 03/31/19 11:30 PM  Result Value Ref Range   Color, Urine AMBER (A) YELLOW    Comment: BIOCHEMICALS MAY BE AFFECTED BY COLOR   APPearance CLEAR CLEAR   Specific Gravity, Urine 1.014 1.005 - 1.030   pH 6.0 5.0 - 8.0   Glucose, UA 150 (A) NEGATIVE mg/dL   Hgb urine dipstick NEGATIVE NEGATIVE   Bilirubin Urine NEGATIVE NEGATIVE   Ketones, ur NEGATIVE NEGATIVE mg/dL   Protein, ur NEGATIVE NEGATIVE mg/dL   Nitrite POSITIVE (A) NEGATIVE   Leukocytes,Ua NEGATIVE NEGATIVE   RBC / HPF 0-5 0 - 5 RBC/hpf   WBC, UA 0-5 0 - 5 WBC/hpf   Bacteria, UA FEW (A) NONE SEEN   Squamous Epithelial / LPF 0-5 0 - 5   Mucus PRESENT     Comment: Performed at [facility], [address] [place],  [address]  I-Stat Beta hCG blood, ED ([facility], [facility], [facility] only)     Status: None   Collection Time: 03/31/19 11:31 PM  Result Value Ref Range   I-stat hCG, quantitative <5.0 <5 mIU/mL   Comment 3            Comment:   GEST. AGE      CONC.  (mIU/mL)   <=1 WEEK        5 - 50     2 WEEKS       50 - 500     3 WEEKS       100 - 10,000     4 WEEKS     1,000 - 30,000        FEMALE AND NON-PREGNANT FEMALE:     LESS THAN 5 mIU/mL   POC occult blood, ED     Status: None   Collection Time: 04/01/19 12:39 AM  Result Value Ref Range   Fecal Occult Bld NEGATIVE NEGATIVE  SARS Coronavirus 2 (CEPHEID - Performed in [place] hospital lab), Hosp Order     Status: None   Collection Time: 04/01/19  1:46 AM  Result Value Ref Range     SARS Coronavirus 2 NEGATIVE NEGATIVE    Comment: (NOTE) If result is NEGATIVE SARS-CoV-2 target nucleic acids are NOT DETECTED. The SARS-CoV-2 RNA is generally detectable in upper and lower  respiratory specimens during the acute phase of infection. The lowest  concentration of SARS-CoV-2 viral copies this  assay can detect is 250  copies / mL. A negative result does not preclude SARS-CoV-2 infection  and should not be used as the sole basis for treatment or other  patient management decisions.  A negative result may occur with  improper specimen collection / handling, submission of specimen other  than nasopharyngeal swab, presence of viral mutation(s) within the  areas targeted by this assay, and inadequate number of viral copies  (<250 copies / mL). A negative result must be combined with clinical  observations, patient history, and epidemiological information. If result is POSITIVE SARS-CoV-2 target nucleic acids are DETECTED. The SARS-CoV-2 RNA is generally detectable in upper and lower  respiratory specimens dur ing the acute phase of infection.  Positive  results are indicative of active infection with SARS-CoV-2.  Clinical  correlation with patient history and other diagnostic information is  necessary to determine patient infection status.  Positive results do  not rule out bacterial infection or co-infection with other viruses. If result is PRESUMPTIVE POSTIVE SARS-CoV-2 nucleic acids MAY BE PRESENT.   A presumptive positive result was obtained on the submitted specimen  and confirmed on repeat testing.  While 2019 novel coronavirus  (SARS-CoV-2) nucleic acids may be present in the submitted sample  additional confirmatory testing may be necessary for epidemiological  and / or clinical management purposes  to differentiate between  SARS-CoV-2 and other Sarbecovirus currently known to infect humans.  If clinically indicated additional testing with an alternate test  methodology (LAB7453) is advised. The SARS-CoV-2 RNA is generally  detectable in upper and lower respiratory sp ecimens during the acute  phase of infection. The expected result is Negative. Fact Sheet for Patients:  https://www.fda.gov/media/136312/download Fact Sheet for Healthcare  Providers: https://www.fda.gov/media/136313/download This test is not yet approved or cleared by the [place] FDA and has been authorized for detection and/or diagnosis of SARS-CoV-2 by FDA under an Emergency Use Authorization (EUA).  This EUA will remain in effect (meaning this test can be used) for the duration of the COVID-19 declaration under Section 564(b)(1) of the Act, 21 U.S.C. section 360bbb-3(b)(1), unless the authorization is terminated or revoked sooner. Performed at [facility], [address] [place], [place] [address]   Type and screen     Status: None (Preliminary result)   Collection Time: 04/01/19  2:39 AM  Result Value Ref Range   ABO/RH(D) A POS    Antibody Screen NEG    Sample Expiration 04/04/2019,2359    Unit Number [id number]    Blood Component Type RED CELLS,LR    Unit division 00    Status of Unit ISSUED    Transfusion Status OK TO TRANSFUSE    Crossmatch Result Compatible    Unit Number [id number]    Blood Component Type RED CELLS,LR    Unit division 00    Status of Unit ISSUED    Transfusion Status OK TO TRANSFUSE    Crossmatch Result      Compatible Performed at [facility], [address] [place], [place] [address]   Prepare RBC     Status: None   Collection Time: 04/01/19  2:39 AM  Result Value Ref Range   Order Confirmation        ORDER PROCESSED BY BLOOD BANK Performed at [facility], [address] [place], [place] [address]   Reticulocytes     Status: Abnormal   Collection Time: 04/01/19  2:39 AM  Result Value Ref Range   Retic Ct Pct 1.8 0.4 - 3.1 %   RBC. 3.43 (L) 3.87 - 5.11 MIL/uL   Retic Count, Absolute 63.1 19.0 - 186.0 K/uL   Immature Retic Fract 27.5 (H) 2.3 - 15.9 %    Comment: Performed at [facility], [address] [place], [place] [address]  Glucose, capillary     Status: Abnormal   Collection Time: 04/01/19  6:25 AM  Result Value Ref Range   Glucose-Capillary 103 (H) 70 - 99 mg/dL    Ct Abdomen Pelvis W  Contrast  Result Date: 04/01/2019 CLINICAL DATA:  47-year-old female with 2 days of abdominal pain and distension. Postprandial pain tonight. EXAM: CT ABDOMEN AND PELVIS WITH CONTRAST TECHNIQUE: Multidetector CT imaging of the abdomen and pelvis was performed using the standard protocol following bolus administration of intravenous contrast. CONTRAST:  100mL OMNIPAQUE IOHEXOL 300 MG/ML  SOLN COMPARISON:  CT Abdomen and Pelvis 07/16/2016. FINDINGS: Lower chest: Mild cardiomegaly. No pericardial effusion. Negative lung bases aside from costophrenic angle atelectasis. Hepatobiliary: Mildly distended gallbladder. No pericholecystic inflammation. Negative liver. Pancreas: Negative. Spleen: Negative. Adrenals/Urinary Tract: Normal adrenal glands. Bilateral renal enhancement and contrast excretion is symmetric and within normal limits. Punctate right nephrolithiasis. Unremarkable urinary bladder. Stomach/Bowel: Negative rectosigmoid colon aside from sigmoid redundancy. Negative left colon. Mildly redundant transverse colon is negative. Negative ascending colon. Thickened and inflamed appendix which courses medially from the cecum on coronal image 49 with regional inflammatory stranding. Appendix: Location: Tracks medial from the cecum on series 2, image 62. Diameter: Up to 12 millimeters Appendicolith: Not identified Mucosal hyper-enhancement: Mild Extraluminal gas: Negative. Periappendiceal collection: Trace free fluid only. The terminal ileum and ileocecal valve are demonstrated on coronal image 47. There is mild if any secondary inflammation of the TI. Fluid-filled but nondilated small bowel in the lower abdomen and pelvis. Trace right lower quadrant free fluid. Prior gastric bypass. There is a small bowel anastomosis in the left upper quadrant with no adverse features. No free air. Vascular/Lymphatic: Mild Calcified aortic atherosclerosis. Major arterial structures are patent. Portal venous system is patent. No  lymphadenopathy. Reproductive: Negative; physiologic left ovarian cysts suspected with simple fluid density on series 2, image 78. Other: No pelvic free fluid. 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 Stable mild L5-S1 spondylolisthesis. No acute osseous abnormality identified. IMPRESSION: 1. Positive for Acute Appendicitis. Trace free fluid without abscess or evidence of perforation. 2. Mild secondary inflammation of the distal small bowel. Prior gastric bypass. 3. Mild cardiomegaly. Electronically Signed   By: H  Hall M.D.   On: 04/01/2019 01:24    ROS:  Pertinent items are noted in HPI.  Blood pressure (!) 111/55, pulse 82, temperature 99 F (37.2 C), temperature source Oral, resp. rate 16, height 5' 10" (1.778 m), weight 96.1 kg, last menstrual period 03/22/2019, SpO2 95 %. Physical Exam: Pleasant mildly obese white female no acute distress Head is normocephalic, atraumatic Lungs clear to auscultation with your breath sounds bilaterally Heart examination reveals regular rate and rhythm without S3, S4, murmurs Abdomen is soft with tenderness no in the right lower quadrant to palpation.  No rigidity is noted.  CT scan images personally reviewed  Assessment/Plan: Impression: Acute appendicitis, chronic anemia Plan: Patient will receive 2 units   of packed red blood cells.  She subsequently undergo a laparoscopic appendectomy.  The risks and benefits of the procedure including bleeding, infection, the possibility of an open procedure, and the possibility of a partial colectomy were fully explained to the patient, who gave informed consent.  Keni Wafer 04/01/2019, 7:32 AM     

## 2019-04-01 NOTE — Progress Notes (Signed)
Ate about 50% of supper and denies nausea or increased pain.

## 2019-04-01 NOTE — Progress Notes (Signed)
From pacu around 1400.  Alert and oriented and 3 lap sites intact with glue.  Vitals stable. At 1500 c/o pain rated a 7 and received dilaudid.  Drank gingerale and ate jello.

## 2019-04-01 NOTE — Discharge Instructions (Signed)
Laparoscopic Appendectomy, Adult, Care After  This sheet gives you information about how to care for yourself after your procedure. Your health care provider may also give you more specific instructions. If you have problems or questions, contact your health care provider.  What can I expect after the procedure?  After the procedure, it is common to have:  · Little energy for normal activities.  · Mild pain in the area where the incisions were made.  · Difficulty passing stool (constipation). This can be caused by:  ? Pain medicine.  ? A decrease in your activity.  Follow these instructions at home:  Medicines  · Take over-the-counter and prescription medicines only as told by your health care provider.  · If you were prescribed an antibiotic medicine, take it as told by your health care provider. Do not stop taking the antibiotic even if you start to feel better.  · Do not drive or use heavy machinery while taking prescription pain medicine.  · Ask your health care provider if the medicine prescribed to you can cause constipation. You may need to take steps to prevent or treat constipation, such as:  ? Drink enough fluid to keep your urine pale yellow.  ? Take over-the-counter or prescription medicines.  ? Eat foods that are high in fiber, such as beans, whole grains, and fresh fruits and vegetables.  ? Limit foods that are high in fat and processed sugars, such as fried or sweet foods.  Incision care    · Follow instructions from your health care provider about how to take care of your incisions. Make sure you:  ? Wash your hands with soap and water before and after you change your bandage (dressing). If soap and water are not available, use hand sanitizer.  ? Change your dressing as told by your health care provider.  ? Leave stitches (sutures), skin glue, or adhesive strips in place. These skin closures may need to stay in place for 2 weeks or longer. If adhesive strip edges start to loosen and curl up, you may  trim the loose edges. Do not remove adhesive strips completely unless your health care provider tells you to do that.  · Check your incision areas every day for signs of infection. Check for:  ? Redness, swelling, or pain.  ? Fluid or blood.  ? Warmth.  ? Pus or a bad smell.  Bathing  · Keep your incisions clean and dry. Clean them as often as told by your health care provider. To do this:  1. Gently wash the incisions with soap and water.  2. Rinse the incisions with water to remove all soap.  3. Pat the incisions dry with a clean towel. Do not rub the incisions.  · Do not take baths, swim, or use a hot tub for 2 weeks, or until your health care provider approves. You may take showers after 48 hours.  Activity    · Do not drive for 24 hours if you were given a sedative during your procedure.  · Rest after the procedure. Return to your normal activities as told by your health care provider. Ask your health care provider what activities are safe for you.  · For 3 weeks, or for as long as told by your health care provider:  ? Do not lift anything that is heavier than 10 lb (4.5 kg), or the limit that you are told.  ? Do not play contact sports.  General instructions  · If you   were sent home with a drain, follow instructions from your health care provider about how to care for it.  · Take deep breaths. This helps to prevent your lungs from developing an infection (pneumonia).  · Keep all follow-up visits as told by your health care provider. This is important.  Contact a health care provider if:  · You have redness, swelling, or pain around an incision.  · You have fluid or blood coming from an incision.  · Your incision feels warm to the touch.  · You have pus or a bad smell coming from an incision or dressing.  · Your incision edges break open after your sutures have been removed.  · You have increasing pain in your shoulders.  · You feel dizzy or you faint.  · You develop shortness of breath.  · You keep feeling  nauseous or you are vomiting.  · You have diarrhea or you cannot control your bowel functions.  · You lose your appetite.  · You develop swelling or pain in your legs.  · You develop a rash.  Get help right away if you have:  · A fever.  · Difficulty breathing.  · Sharp pains in your chest.  Summary  · After a laparoscopic appendectomy, it is common to have little energy for normal activities, mild pain in the area of the incisions, and constipation.  · Infection is the most common complication after this procedure. Follow your health care provider's instructions about caring for yourself after the procedure.  · Rest after the procedure. Return to your normal activities as told by your health care provider.  · Contact your health care provider if you notice signs of infection around your incisions or you develop shortness of breath. Get help right away if you have a fever, chest pain, or difficulty breathing.  This information is not intended to replace advice given to you by your health care provider. Make sure you discuss any questions you have with your health care provider.  Document Released: 10/29/2005 Document Revised: 05/01/2018 Document Reviewed: 05/01/2018  Elsevier Interactive Patient Education © 2019 [employer]

## 2019-04-01 NOTE — Anesthesia Preprocedure Evaluation (Signed)
Anesthesia Evaluation    Airway Mallampati: II       Dental  (+) Teeth Intact   Pulmonary    breath sounds clear to auscultation       Cardiovascular hypertension, On Medications  Rhythm:regular     Neuro/Psych    GI/Hepatic   Endo/Other  diabetes, Type 2  Renal/GU      Musculoskeletal   Abdominal   Peds  Hematology  (+) Blood dyscrasia, anemia ,   Anesthesia Other Findings 2u PRBCs with Hgb up to 7, SR, asymptomatic Roux en Y GBP 2007  Reproductive/Obstetrics                             Anesthesia Physical Anesthesia Plan  ASA: III  Anesthesia Plan: General   Post-op Pain Management:    Induction:   PONV Risk Score and Plan:   Airway Management Planned:   Additional Equipment:   Intra-op Plan:   Post-operative Plan:   Informed Consent: I have reviewed the patients History and Physical, chart, labs and discussed the procedure including the risks, benefits and alternatives for the proposed anesthesia with the patient or authorized representative who has indicated his/her understanding and acceptance.       Plan Discussed with: Anesthesiologist  Anesthesia Plan Comments:         Anesthesia Quick Evaluation

## 2019-04-01 NOTE — Progress Notes (Signed)
PROGRESS NOTE  Norma Carroll ZOX:[id number] DOB: 02-Oct-1971 DOA: 03/31/2019 PCP: Norma Downer, NP  Brief History:  48 year old female with a history of diabetes mellitus type 2, hypertension, gastric bypass, and acute appendicitis in April 2017 treated medically presenting with 2-day history of abdominal pain that started initially in the lower abdomen and became generalized.  She stated that she had some temporary relief on the morning of 03/31/2019, but developed worsening pain that was generalized again later in the evening.  As result, the patient presented for further evaluation.  The patient had some nausea but denies any vomiting, diarrhea, dysuria, hematuria, hematochezia, melena.  She denies any NSAIDs.  She does not take any over-the-counter medicines.  In fact, the patient has been noncompliant with her B12 supplementation.  Upon presentation, CT of the abdomen and pelvis showed a thickened inflamed appendix with regional inflammatory stranding near the distal small bowel.  Her small bowel anastomosis in the left upper quadrant was unremarkable.  The patient was started on IV Zosyn.  General surgery was consulted to assist with management.  Assessment/Plan: Acute appendicitis -Continue Zosyn -Appreciate general surgery consult -Plans noted for laparoscopic appy on 04/01/19 -Judicious pain control -CT abdomen as above  Iron deficiency anemia -Give Feraheme IV x1 -Start oral iron once able to tolerate p.o. -Iron saturation 3%, ferritin 3  B12 deficiency -This is also contributed to the patient's anemia -FOBT negative -Patient is noncompliant with B12 supplementation -B12 IM daily while the patient is hospitalized -She will need to restart B12 supplementation after discharge -Serum B12--106  Diabetes mellitus type 2 -Holding metformin -Hemoglobin A1c  Essential hypertension -Restart lisinopril once able to take p.o. -personally reviewed EKG--sinus,  nonspecific T wave change     Disposition Plan:   Home when cleared by surgery Family Communication:  No  Family at bedside  Consultants:  General surgery  Code Status:  FULL  DVT Prophylaxis:  [place] Lovenox   Procedures: As Listed in Progress Note Above  Antibiotics: Zosyn 5/19>>>    Total time spent 35 minutes.  Greater than 50% spent face to face counseling and coordinating care. 0750 to 0825     Subjective: Pt states her abd pain is better controlled with opioids, now mild-moderate.  Worst pain in RLQ.  She has nausea, but denies f/c, cp, sob, cough, vomiting, diarrhea, hematochezia, melena  Objective: Vitals:   04/01/19 0412 04/01/19 0433 04/01/19 0623 04/01/19 0706  BP: 126/63 129/62 123/68 (!) 111/55  Pulse: 87 83 86 82  Resp: Temp: 99.5 F (37.5 C) 98.9 F (37.2 C) 99.6 F (37.6 C) 99 F (37.2 C)  TempSrc: Oral Oral Oral Oral  SpO2: 97% 97% 98% 95%  Weight:      Height:        Intake/Output Summary (Last 24 hours) at 04/01/2019 8119 Last data filed at 04/01/2019 0400 Gross per 24 hour  Intake 41.43 ml  Output --  Net 41.43 ml   Weight change:  Exam:   General:  Pt is alert, follows commands appropriately, not in acute distress  HEENT: No icterus, No thrush, No neck mass, [place]/AT  Cardiovascular: RRR, S1/S2, no rubs, no gallops  Respiratory: CTA bilaterally, no wheezing, no crackles, no rhonchi  Abdomen: Soft/+BS, non tender, non distended, no guarding  Extremities: No edema, No lymphangitis, No petechiae, No rashes, no synovitis   Data Reviewed: I have personally reviewed following labs and imaging  studies Basic Metabolic Panel: Recent Labs  Lab 03/31/19 2252  NA 136  K 4.0  CL 103  CO2 23  GLUCOSE 173*  BUN 15  CREATININE 0.70  CALCIUM 8.6*   Liver Function Tests: Recent Labs  Lab 03/31/19 2252  AST 14*  ALT 10  ALKPHOS 61  BILITOT 0.3  PROT 7.1  ALBUMIN 4.0   Recent Labs  Lab 03/31/19 2252  LIPASE 37    No results for input(s): AMMONIA in the last 168 hours. Coagulation Profile: No results for input(s): INR, PROTIME in the last 168 hours. CBC: Recent Labs  Lab 03/31/19 2252  WBC 8.5  HGB 6.3*  HCT 24.4*  MCV 66.7*  PLT 215   Cardiac Enzymes: No results for input(s): CKTOTAL, CKMB, CKMBINDEX, TROPONINI in the last 168 hours. BNP: Invalid input(s): POCBNP CBG: Recent Labs  Lab 04/01/19 0625  GLUCAP 103*   HbA1C: No results for input(s): HGBA1C in the last 72 hours. Urine analysis:    Component Value Date/Time   COLORURINE AMBER (A) 03/31/2019 2330   APPEARANCEUR CLEAR 03/31/2019 2330   LABSPEC 1.014 03/31/2019 2330   PHURINE 6.0 03/31/2019 2330   GLUCOSEU 150 (A) 03/31/2019 2330   HGBUR NEGATIVE 03/31/2019 2330   BILIRUBINUR NEGATIVE 03/31/2019 2330   KETONESUR NEGATIVE 03/31/2019 2330   PROTEINUR NEGATIVE 03/31/2019 2330   NITRITE POSITIVE (A) 03/31/2019 2330   LEUKOCYTESUR NEGATIVE 03/31/2019 2330   Sepsis Labs: @LABRCNTIP (procalcitonin:4,lacticidven:4) ) Recent Results (from the past 240 hour(s))  SARS Coronavirus 2 (CEPHEID - Performed in [facility] lab), Hosp Order     Status: None   Collection Time: 04/01/19  1:46 AM  Result Value Ref Range Status   SARS Coronavirus 2 NEGATIVE NEGATIVE Final    Comment: (NOTE) If result is NEGATIVE SARS-CoV-2 target nucleic acids are NOT DETECTED. The SARS-CoV-2 RNA is generally detectable in upper and lower  respiratory specimens during the acute phase of infection. The lowest  concentration of SARS-CoV-2 viral copies this assay can detect is 250  copies / mL. A negative result does not preclude SARS-CoV-2 infection  and should not be used as the sole basis for treatment or other  patient management decisions.  A negative result may occur with  improper specimen collection / handling, submission of specimen other  than nasopharyngeal swab, presence of viral mutation(s) within the  areas targeted by this  assay, and inadequate number of viral copies  (<250 copies / mL). A negative result must be combined with clinical  observations, patient history, and epidemiological information. If result is POSITIVE SARS-CoV-2 target nucleic acids are DETECTED. The SARS-CoV-2 RNA is generally detectable in upper and lower  respiratory specimens dur ing the acute phase of infection.  Positive  results are indicative of active infection with SARS-CoV-2.  Clinical  correlation with patient history and other diagnostic information is  necessary to determine patient infection status.  Positive results do  not rule out bacterial infection or co-infection with other viruses. If result is PRESUMPTIVE POSTIVE SARS-CoV-2 nucleic acids MAY BE PRESENT.   A presumptive positive result was obtained on the submitted specimen  and confirmed on repeat testing.  While 2019 novel coronavirus  (SARS-CoV-2) nucleic acids may be present in the submitted sample  additional confirmatory testing may be necessary for epidemiological  and / or clinical management purposes  to differentiate between  SARS-CoV-2 and other Sarbecovirus currently known to infect humans.  If clinically indicated additional testing with an alternate test  methodology [phone]([id number])  is advised. The SARS-CoV-2 RNA is generally  detectable in upper and lower respiratory sp ecimens during the acute  phase of infection. The expected result is Negative. Fact Sheet for Patients:  BoilerBrush.com.cy Fact Sheet for Healthcare Providers: https://pope.com/ This test is not yet approved or cleared by the [place] FDA and has been authorized for detection and/or diagnosis of SARS-CoV-2 by FDA under an Emergency Use Authorization (EUA).  This EUA will remain in effect (meaning this test can be used) for the duration of the COVID-19 declaration under Section 564(b)(1) of the Act, 21 U.S.C. section 360bbb-3(b)(1),  unless the authorization is terminated or revoked sooner. Performed at [facility] [address], [address] [place], [place] [address]      Scheduled Meds:  pantoprazole (PROTONIX) IV  40 mg Intravenous Q24H   Continuous Infusions:  piperacillin-tazobactam      Procedures/Studies: Ct Abdomen Pelvis W Contrast  Result Date: 04/01/2019 CLINICAL DATA:  48 year old female with 2 days of abdominal pain and distension. Postprandial pain tonight. EXAM: CT ABDOMEN AND PELVIS WITH CONTRAST TECHNIQUE: Multidetector CT imaging of the abdomen and pelvis was performed using the standard protocol following bolus administration of intravenous contrast. CONTRAST:  OMNIPAQUE IOHEXOL 300 MG/ML  SOLN COMPARISON:  CT Abdomen and Pelvis 07/16/2016. FINDINGS: Lower chest: Mild cardiomegaly. No pericardial effusion. Negative lung bases aside from costophrenic angle atelectasis. Hepatobiliary: Mildly distended gallbladder. No pericholecystic inflammation. Negative liver. Pancreas: Negative. Spleen: Negative. Adrenals/Urinary Tract: Normal adrenal glands. Bilateral renal enhancement and contrast excretion is symmetric and within normal limits. Punctate right nephrolithiasis. Unremarkable urinary bladder. Stomach/Bowel: Negative rectosigmoid colon aside from sigmoid redundancy. Negative left colon. Mildly redundant transverse colon is negative. Negative ascending colon. Thickened and inflamed appendix which courses medially from the cecum on coronal image 49 with regional inflammatory stranding. Appendix: Location: Tracks medial from the cecum on series 2, image 62. Diameter: Up to 12 millimeters Appendicolith: Not identified Mucosal hyper-enhancement: Mild Extraluminal gas: Negative. Periappendiceal collection: Trace free fluid only. The terminal ileum and ileocecal valve are demonstrated on coronal image 47. There is mild if any secondary inflammation of the TI. Fluid-filled but nondilated small bowel in the lower abdomen  and pelvis. Trace right lower quadrant free fluid. Prior gastric bypass. There is a small bowel anastomosis in the left upper quadrant with no adverse features. No free air. Vascular/Lymphatic: Mild Calcified aortic atherosclerosis. Major arterial structures are patent. Portal venous system is patent. No lymphadenopathy. Reproductive: Negative; physiologic left ovarian cysts suspected with simple fluid density on series 2, image 78. Other: No pelvic free fluid. 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 Stable mild L5-S1 spondylolisthesis. No acute osseous abnormality identified. IMPRESSION: 1. Positive for Acute Appendicitis. Trace free fluid without abscess or evidence of perforation. 2. Mild secondary inflammation of the distal small bowel. Prior gastric bypass. 3. Mild cardiomegaly. Electronically Signed   By: Odessa Fleming M.D.   On: 04/01/2019 01:24    Catarina Hartshorn, DO  [facility] Pager [phone]  If 7PM-7AM, please contact night-coverage [email or web address] Password [email or web address] 04/01/2019, 8:21 AM   LOS: 0 days

## 2019-04-01 NOTE — Op Note (Signed)
Patient:  Norma Carroll  DOB:  02/19/1971  MRN:  [id number]   Preop Diagnosis: Acute appendicitis  Postop Diagnosis: Same  Procedure: Laparoscopic appendectomy  Surgeon: Franky Macho, MD  Anes: General endotracheal  Indications: Patient is a 48 year old white female who presents with right lower quadrant abdominal pain.  CT scan of the abdomen reveals acute appendicitis.  She was last treated for perforated appendicitis in 2017.  The risks and benefits of the procedure including bleeding, infection, the possibility of a bowel resection, the possibility of an open procedure were fully explained to the patient, who gave informed consent.  Procedure note: The patient was placed in the supine position.  After induction of general endotracheal anesthesia, the abdomen was prepped and draped using usual sterile technique with ChloraPrep.  Surgical site confirmation was performed.  A supraumbilical incision was made down to the fascia.  A Veress needle was introduced into the abdominal cavity and confirmation of placement was done using the saline drop test.  The abdomen was then insufflated to 15 mmHg pressure.  An 11 mm trocar was introduced into the abdominal cavity under direct visualization without difficulty.  The patient was placed in deeper Trendelenburg position and 1 additional 12 mm trocar was placed in the suprapubic region and the 5 mm trocar was placed in the left lower quadrant region.  The appendix was visualized and noted to be acutely inflamed.  No purulent drainage was noted.  The mesoappendix was divided using the harmonic scalpel.  A standard Endo GIA was placed across the base the appendix and fired.  The appendix was then removed using an Endo Catch bag without difficulty.  The appendix was inspected.  A lumen was noted within the specimen.  The staple line was inspected and there was noted to be intact.  The cecum was fully visualized and no other openings were present.   Arista and Surgicel were placed along the bed of the mesoappendix and staple line.  All fluid and air were then evacuated from the abdominal cavity prior to the removal of the trochars.  All wounds were irrigated with normal saline.  All wounds were injected with Exparel.  The supraumbilical fascia as well as suprapubic fascia were reapproximated using 0 Vicryl interrupted sutures.  All skin incisions were closed using a 4-0 Monocryl subcuticular suture.  Dermabond was applied.  All tape and needle counts were correct at the end of the procedure.  The patient was extubated in the operating room and transferred to PACU in stable condition.  Complications: None  EBL: Minimal  Specimen: Appendix

## 2019-04-01 NOTE — ED Provider Notes (Signed)
[facility] EMERGENCY DEPARTMENT Provider Note   CSN: [id number] Arrival date & time: 03/31/19  2156    History   Chief Complaint Chief Complaint  Patient presents with   Abdominal Pain    HPI Norma Carroll is a 48 y.o. female.     Patient complains of abdominal pain for 2 days.  Patient states the pain is getting worse.  Patient had a history of appendicitis 3 years ago with rupture that was treated with antibiotics.  She did not have surgery at that time  The history is provided by the patient.  Abdominal Pain  Pain location:  Periumbilical Pain quality: aching   Pain radiates to:  RLQ Pain severity:  Moderate Onset quality:  Gradual Timing:  Constant Progression:  Worsening Chronicity:  New Context: not alcohol use   Relieved by:  Nothing Worsened by:  Nothing Associated symptoms: no chest pain, no cough, no diarrhea, no fatigue and no hematuria     Past Medical History:  Diagnosis Date   Anemia    Blood transfusion without reported diagnosis 2007   after gastric bypass surgery   Diabetes mellitus without complication (HCC) 2003   Hypertension 2008    Patient Active Problem List   Diagnosis Date Noted   Acute appendicitis 04/01/2019   Acute perforated appendicitis 07/16/2016   Diabetes (HCC) 07/16/2016   HTN (hypertension) 07/16/2016   Breast mass, left/CT abd from 07/16/16 07/16/2016   Vitamin B12 deficiency 09/22/2012    Past Surgical History:  Procedure Laterality Date   LAPAROSCOPIC GASTRIC BYPASS  2007     OB History   No obstetric history on file.      Home Medications    Prior to Admission medications   Medication Sig Start Date End Date Taking? Authorizing Provider  acetaminophen (TYLENOL) 500 MG tablet Take 500 mg by mouth every 6 (six) hours as needed for mild pain, moderate pain or headache.    [provider]  amoxicillin-clavulanate (AUGMENTIN) 875-125 MG tablet Take 1 tablet by mouth 2 (two) times  daily. 07/19/16   Erick Blinks, MD  calcium-vitamin D (OSCAL WITH D) 500-200 MG-UNIT tablet Take 1 tablet by mouth 3 (three) times daily. 07/19/16   Erick Blinks, MD  Cyanocobalamin (B-12) 1000 MCG SUBL Place 1,000 mcg under the tongue daily. 07/19/16   Erick Blinks, MD  lisinopril (PRINIVIL,ZESTRIL) 20 MG tablet Take 20 mg by mouth daily.    [provider]  metFORMIN (GLUCOPHAGE) 500 MG tablet Take 1,000 mg by mouth daily with breakfast. Take two 500 mg tablets every am    [provider]  Multiple Vitamins-Iron (MULTIVITAMINS WITH IRON) TABS tablet Take 1 tablet by mouth 2 (two) times daily. 07/19/16   Erick Blinks, MD    Family History Family History  Problem Relation Age of Onset   Heart attack Father    Hypertension Father    Diabetes Father     Social History Social History   Tobacco Use   Smoking status: Never Smoker   Smokeless tobacco: Never Used  Substance Use Topics   Alcohol use: No   Drug use: No     Allergies   Patient has no known allergies.   Review of Systems Review of Systems  Constitutional: Negative for appetite change and fatigue.  HENT: Negative for congestion, ear discharge and sinus pressure.   Eyes: Negative for discharge.  Respiratory: Negative for cough.   Cardiovascular: Negative for chest pain.  Gastrointestinal: Positive for abdominal pain. Negative for  diarrhea.  Genitourinary: Negative for frequency and hematuria.  Musculoskeletal: Negative for back pain.  Skin: Negative for rash.  Neurological: Negative for seizures and headaches.  Psychiatric/Behavioral: Negative for hallucinations.     Physical Exam Updated Vital Signs BP (!) 153/70    Pulse 87    Temp 98.3 F (36.8 C) (Oral)    Resp 16    Ht  (1.778 m)    Wt 95.3 kg    LMP 03/22/2019 (Approximate)    SpO2 99%    BMI 30.13 kg/m   Physical Exam Vitals signs and nursing note reviewed.  Constitutional:      Appearance: She is well-developed.    HENT:     Head: Normocephalic.     Nose: Nose normal.  Eyes:     General: No scleral icterus.    Conjunctiva/sclera: Conjunctivae normal.  Neck:     Musculoskeletal: Neck supple.     Thyroid: No thyromegaly.  Cardiovascular:     Rate and Rhythm: Normal rate and regular rhythm.     Heart sounds: No murmur. No friction rub. No gallop.   Pulmonary:     Breath sounds: No stridor. No wheezing or rales.  Chest:     Chest wall: No tenderness.  Abdominal:     General: There is no distension.     Tenderness: There is abdominal tenderness. There is no rebound.  Musculoskeletal: Normal range of motion.  Lymphadenopathy:     Cervical: No cervical adenopathy.  Skin:    Findings: No erythema or rash.  Neurological:     Mental Status: She is oriented to person, place, and time.     Motor: No abnormal muscle tone.     Coordination: Coordination normal.  Psychiatric:        Behavior: Behavior normal.      ED Treatments / Results  Labs (all labs ordered are listed, but only abnormal results are displayed) Labs Reviewed  COMPREHENSIVE METABOLIC PANEL - Abnormal; Notable for the following components:      Result Value   Glucose, Bld 173 (*)    Calcium 8.6 (*)    AST 14 (*)    All other components within normal limits  CBC - Abnormal; Notable for the following components:   RBC 3.66 (*)    Hemoglobin 6.3 (*)    HCT 24.4 (*)    MCV 66.7 (*)    MCH 17.2 (*)    MCHC 25.8 (*)    RDW 19.5 (*)    All other components within normal limits  URINALYSIS, ROUTINE W REFLEX MICROSCOPIC - Abnormal; Notable for the following components:   Color, Urine AMBER (*)    Glucose, UA 150 (*)    Nitrite POSITIVE (*)    Bacteria, UA FEW (*)    All other components within normal limits  SARS CORONAVIRUS 2 (HOSPITAL ORDER, PERFORMED IN [facility])  LIPASE, BLOOD  OCCULT BLOOD X 1 CARD TO LAB, STOOL  I-STAT BETA HCG BLOOD, ED ([facility], [facility], [facility] ONLY)  POC OCCULT BLOOD, ED  TYPE AND SCREEN   PREPARE RBC (CROSSMATCH)    EKG None  Radiology Ct Abdomen Pelvis W Contrast  Result Date: 04/01/2019 CLINICAL DATA:  48 year old female with 2 days of abdominal pain and distension. Postprandial pain tonight. EXAM: CT ABDOMEN AND PELVIS WITH CONTRAST TECHNIQUE: Multidetector CT imaging of the abdomen and pelvis was performed using the standard protocol following bolus administration of intravenous contrast. CONTRAST:  OMNIPAQUE IOHEXOL 300 MG/ML  SOLN COMPARISON:  CT Abdomen and Pelvis 07/16/2016. FINDINGS: Lower chest: Mild cardiomegaly. No pericardial effusion. Negative lung bases aside from costophrenic angle atelectasis. Hepatobiliary: Mildly distended gallbladder. No pericholecystic inflammation. Negative liver. Pancreas: Negative. Spleen: Negative. Adrenals/Urinary Tract: Normal adrenal glands. Bilateral renal enhancement and contrast excretion is symmetric and within normal limits. Punctate right nephrolithiasis. Unremarkable urinary bladder. Stomach/Bowel: Negative rectosigmoid colon aside from sigmoid redundancy. Negative left colon. Mildly redundant transverse colon is negative. Negative ascending colon. Thickened and inflamed appendix which courses medially from the cecum on coronal image 49 with regional inflammatory stranding. Appendix: Location: Tracks medial from the cecum on series 2, image 62. Diameter: Up to 12 millimeters Appendicolith: Not identified Mucosal hyper-enhancement: Mild Extraluminal gas: Negative. Periappendiceal collection: Trace free fluid only. The terminal ileum and ileocecal valve are demonstrated on coronal image 47. There is mild if any secondary inflammation of the TI. Fluid-filled but nondilated small bowel in the lower abdomen and pelvis. Trace right lower quadrant free fluid. Prior gastric bypass. There is a small bowel anastomosis in the left upper quadrant with no adverse features. No free air. Vascular/Lymphatic: Mild Calcified aortic atherosclerosis.  Major arterial structures are patent. Portal venous system is patent. No lymphadenopathy. Reproductive: Negative; physiologic left ovarian cysts suspected with simple fluid density on series 2, image 78. Other: No pelvic free fluid. 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 Stable mild L5-S1 spondylolisthesis. No acute osseous abnormality identified. IMPRESSION: 1. Positive for Acute Appendicitis. Trace free fluid without abscess or evidence of perforation. 2. Mild secondary inflammation of the distal small bowel. Prior gastric bypass. 3. Mild cardiomegaly. Electronically Signed   By: Odessa FlemingH  Hall M.D.   On: 04/01/2019 01:24    Procedures Procedures (including critical care time)  Medications Ordered in ED Medications  sodium chloride flush (NS) 0.9 % injection 3 mL (has no administration in time range)  piperacillin-tazobactam (ZOSYN) IVPB 3.375 g (3.375 g Intravenous New Bag/Given 04/01/19 0202)  0.9 %  sodium chloride infusion (has no administration in time range)  pantoprazole (PROTONIX) injection 40 mg (40 mg Intravenous Given 03/31/19 2325)  ondansetron (ZOFRAN) injection 4 mg (4 mg Intravenous Given 03/31/19 2325)  sodium chloride 0.9 % bolus 500 mL (0 mLs Intravenous Stopped 03/31/19 2359)  iohexol (OMNIPAQUE) 300 MG/ML solution 100 mL (100 mLs Intravenous Contrast Given 04/01/19 0041)  HYDROmorphone (DILAUDID) injection 1 mg (1 mg Intravenous Given 04/01/19 0207)     Initial Impression / Assessment and Plan / ED Course  I have reviewed the triage vital signs and the nursing notes.  Pertinent labs & imaging results that were available during my care of the patient were reviewed by me and considered in my medical decision making (see chart for details).   Labs and x-rays reviewed.  Patient has appendicitis.   Patient has severe anemia and will be transfused 2 units  CRITICAL CARE Performed by: Bethann BerkshireJoseph Jansen Sciuto Total critical  care time:35 minutes Critical care time was exclusive of separately billable procedures and treating other patients. Critical care was necessary to treat or prevent imminent or life-threatening deterioration. Critical care was time spent personally by me on the following activities: development of treatment plan with patient and/or surrogate as well as nursing, discussions with consultants, evaluation of patient's response to treatment, examination of patient, obtaining history from patient or surrogate, ordering and performing treatments and interventions, ordering and review of laboratory studies, ordering and review of radiographic studies, pulse oximetry and re-evaluation of patient's condition.  Patient will be admitted to medicine and transfused 2 units of packed red blood cells.  General surgery will see the patient tomorrow and remove her appendix  Final Clinical Impressions(s) / ED Diagnoses   Final diagnoses:  Acute appendicitis, unspecified acute appendicitis type    ED Discharge Orders    None       Bethann Berkshire, MD 04/01/19 0214

## 2019-04-01 NOTE — Anesthesia Postprocedure Evaluation (Signed)
Anesthesia Post Note  Patient: Norma Carroll  Procedure(s) Performed: APPENDECTOMY LAPAROSCOPIC (N/A Abdomen)  Patient location during evaluation: PACU Anesthesia Type: General Level of consciousness: awake and alert and oriented Pain management: pain level controlled Vital Signs Assessment: post-procedure vital signs reviewed and stable Respiratory status: spontaneous breathing, nonlabored ventilation, respiratory function stable and patient connected to face mask oxygen Cardiovascular status: stable Postop Assessment: no headache Anesthetic complications: no     Last Vitals:  Vitals:   04/01/19 0706 04/01/19 1106  BP: (!) 111/55 126/64  Pulse: 82 77  Resp: 16 20  Temp: 37.2 C 37.1 C  SpO2: 95% 96%    Last Pain:  Vitals:   04/01/19 1106  TempSrc: Oral  PainSc: 4                  Deondre Marinaro

## 2019-04-01 NOTE — H&P (Signed)
History and Physical    Norma Carroll ZOX:[id number] DOB: June 04, 1971 DOA: 03/31/2019  PCP: Erasmo Downer, NP   Patient coming from: Home.  I have personally briefly reviewed patient's old medical records in [facility]  Chief Complaint: Abdominal pain.  HPI: Norma Carroll is a 48 y.o. female with medical history significant of post gastric bypass chronic anemia, type 2 diabetes, hypertension, obesity, history of acute appendicitis treated with antibiotics in September/2017 who is coming to the emergency department due to abdominal pain for the past 2 days associated with nausea, decreased appetite and malaise, but denies fever, chills, emesis, diarrhea, constipation, melena or hematochezia.  She denies dysuria, frequency or hematuria.  She denies sore throat, rhinorrhea, dyspnea, wheezing or hemoptysis.  No chest pain, palpitations, dizziness, diaphoresis, PND, orthopnea or pitting edema lower extremities.  She denies polyuria, polydipsia, polyphagia or blurred vision.  ED Course: Initial vital signs were temperature 98.3 F, pulse 77, respirations 16, blood pressure 136/69 mmHg and O2 sat 99% on room air.  The patient received 1 mg of hydromorphone IVP, 4 mg of ondansetron IVP, Zosyn 3.375 g IVPB and 40 mg of pantoprazole IVPB.  Her urinalysis has glucosuria over 150 mg/dL.  There was positive nitrites, but no significant WBC and only a few bacteria on microscopic examination.  I-STAT hCG was negative.  CBC showed a white count of 8.5 hemoglobin of 6.3 g/dL and platelets 811.  Lipase was normal.  CMP shows a calcium of 8.6 and glucose of 173 mg/dL, the rest of the values are unremarkable.  Imaging: CT abdomen and pelvis with contrast show acute appendicitis with trace fluid without abscess or evidence of perforation.  There was mild secondary inflammation of the distal small bowel.  Mild cardiomegaly.  Review of Systems: As per HPI otherwise 10 point review of systems  negative.   Past Medical History:  Diagnosis Date  . Anemia   . Blood transfusion without reported diagnosis 2007   after gastric bypass surgery  . Diabetes mellitus without complication (HCC) 2003  . Hypertension 2008    Past Surgical History:  Procedure Laterality Date  . LAPAROSCOPIC GASTRIC BYPASS  2007     reports that she has never smoked. She has never used smokeless tobacco. She reports that she does not drink alcohol or use drugs.  No Known Allergies  Family History  Problem Relation Age of Onset  . Heart attack Father   . Hypertension Father   . Diabetes Father    Prior to Admission medications   Medication Sig Start Date End Date Taking? Authorizing Provider  acetaminophen (TYLENOL) 500 MG tablet Take 500 mg by mouth every 6 (six) hours as needed for mild pain, moderate pain or headache.    [provider]  amoxicillin-clavulanate (AUGMENTIN) 875-125 MG tablet Take 1 tablet by mouth 2 (two) times daily. 07/19/16   Erick Blinks, MD  calcium-vitamin D (OSCAL WITH D) 500-200 MG-UNIT tablet Take 1 tablet by mouth 3 (three) times daily. 07/19/16   Erick Blinks, MD  Cyanocobalamin (B-12) 1000 MCG SUBL Place 1,000 mcg under the tongue daily. 07/19/16   Erick Blinks, MD  lisinopril (PRINIVIL,ZESTRIL) 20 MG tablet Take 20 mg by mouth daily.    [provider]  metFORMIN (GLUCOPHAGE) 500 MG tablet Take 1,000 mg by mouth daily with breakfast. Take two 500 mg tablets every am    [provider]  Multiple Vitamins-Iron (MULTIVITAMINS WITH IRON) TABS tablet Take 1 tablet by mouth 2 (two)  times daily. 07/19/16   Erick Blinks, MD    Physical Exam: Vitals:   04/01/19 0304 04/01/19 0324 04/01/19 0412 04/01/19 0433  BP: (!) 145/73  126/63 129/62  Pulse: 90  87 83  Resp: 16  18 18   Temp: 99.3 F (37.4 C)  99.5 F (37.5 C) 98.9 F (37.2 C)  TempSrc: Oral  Oral Oral  SpO2: 98% 98% 97% 97%  Weight: 96.1 kg     Height: 5\' 10"  (1.778 m)        Constitutional: NAD, calm, comfortable Eyes: PERRL, lids and conjunctivae normal ENMT: Mucous membranes are moist. Posterior pharynx clear of any exudate or lesions. Neck: normal, supple, no masses, no thyromegaly Respiratory: Decreased breath sounds in bases, otherwise clear to auscultation bilaterally, no wheezing, no crackles. Normal respiratory effort. No accessory muscle use.  Cardiovascular: Regular rate and rhythm, no murmurs / rubs / gallops. No extremity edema. 2+ pedal pulses. No carotid bruits.  Abdomen: Obese, soft, mild diffuse tenderness, more intense on RUQ, positive McBurney point, no masses palpated. No hepatosplenomegaly. Bowel sounds positive.  Musculoskeletal: no clubbing / cyanosis. Good ROM, no contractures. Normal muscle tone.  Skin: no rashes, lesions, ulcers on limited dermatological examination. Neurologic: CN 2-12 grossly intact. Sensation intact, DTR normal. Strength 5/5 in all 4.  Psychiatric: Normal judgment and insight. Alert and oriented x 3. Normal mood.   Labs on Admission: I have personally reviewed following labs and imaging studies  CBC: Recent Labs  Lab 03/31/19 2252  WBC 8.5  HGB 6.3*  HCT 24.4*  MCV 66.7*  PLT 215   Basic Metabolic Panel: Recent Labs  Lab 03/31/19 2252  NA 136  K 4.0  CL 103  CO2 23  GLUCOSE 173*  BUN 15  CREATININE 0.70  CALCIUM 8.6*   GFR: Estimated Creatinine Clearance: 107.9 mL/min (by C-G formula based on SCr of 0.7 mg/dL). Liver Function Tests: Recent Labs  Lab 03/31/19 2252  AST 14*  ALT 10  ALKPHOS 61  BILITOT 0.3  PROT 7.1  ALBUMIN 4.0   Recent Labs  Lab 03/31/19 2252  LIPASE 37   No results for input(s): AMMONIA in the last 168 hours. Coagulation Profile: No results for input(s): INR, PROTIME in the last 168 hours. Cardiac Enzymes: No results for input(s): CKTOTAL, CKMB, CKMBINDEX, TROPONINI in the last 168 hours. BNP (last 3 results) No results for input(s): PROBNP in the last 8760 hours.  HbA1C: No results for input(s): HGBA1C in the last 72 hours. CBG: No results for input(s): GLUCAP in the last 168 hours. Lipid Profile: No results for input(s): CHOL, HDL, LDLCALC, TRIG, CHOLHDL, LDLDIRECT in the last 72 hours. Thyroid Function Tests: No results for input(s): TSH, T4TOTAL, FREET4, T3FREE, THYROIDAB in the last 72 hours. Anemia Panel: No results for input(s): VITAMINB12, FOLATE, FERRITIN, TIBC, IRON, RETICCTPCT in the last 72 hours. Urine analysis:    Component Value Date/Time   COLORURINE AMBER (A) 03/31/2019 2330   APPEARANCEUR CLEAR 03/31/2019 2330   LABSPEC 1.014 03/31/2019 2330   PHURINE 6.0 03/31/2019 2330   GLUCOSEU 150 (A) 03/31/2019 2330   HGBUR NEGATIVE 03/31/2019 2330   BILIRUBINUR NEGATIVE 03/31/2019 2330   KETONESUR NEGATIVE 03/31/2019 2330   PROTEINUR NEGATIVE 03/31/2019 2330   NITRITE POSITIVE (A) 03/31/2019 2330   LEUKOCYTESUR NEGATIVE 03/31/2019 2330    Radiological Exams on Admission: Ct Abdomen Pelvis W Contrast  Result Date: 04/01/2019 CLINICAL DATA:  48 year old female with 2 days of abdominal pain and distension. Postprandial pain tonight. EXAM:  CT ABDOMEN AND PELVIS WITH CONTRAST TECHNIQUE: Multidetector CT imaging of the abdomen and pelvis was performed using the standard protocol following bolus administration of intravenous contrast. CONTRAST:  100mL OMNIPAQUE IOHEXOL 300 MG/ML  SOLN COMPARISON:  CT Abdomen and Pelvis 07/16/2016. FINDINGS: Lower chest: Mild cardiomegaly. No pericardial effusion. Negative lung bases aside from costophrenic angle atelectasis. Hepatobiliary: Mildly distended gallbladder. No pericholecystic inflammation. Negative liver. Pancreas: Negative. Spleen: Negative. Adrenals/Urinary Tract: Normal adrenal glands. Bilateral renal enhancement and contrast excretion is symmetric and within normal limits. Punctate right nephrolithiasis. Unremarkable urinary bladder. Stomach/Bowel: Negative rectosigmoid colon aside from sigmoid  redundancy. Negative left colon. Mildly redundant transverse colon is negative. Negative ascending colon. Thickened and inflamed appendix which courses medially from the cecum on coronal image 49 with regional inflammatory stranding. Appendix: Location: Tracks medial from the cecum on series 2, image 62. Diameter: Up to 12 millimeters Appendicolith: Not identified Mucosal hyper-enhancement: Mild Extraluminal gas: Negative. Periappendiceal collection: Trace free fluid only. The terminal ileum and ileocecal valve are demonstrated on coronal image 47. There is mild if any secondary inflammation of the TI. Fluid-filled but nondilated small bowel in the lower abdomen and pelvis. Trace right lower quadrant free fluid. Prior gastric bypass. There is a small bowel anastomosis in the left upper quadrant with no adverse features. No free air. Vascular/Lymphatic: Mild Calcified aortic atherosclerosis. Major arterial structures are patent. Portal venous system is patent. No lymphadenopathy. Reproductive: Negative; physiologic left ovarian cysts suspected with simple fluid density on series 2, image 78. Other: No pelvic free fluid. 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 Stable mild L5-S1 spondylolisthesis. No acute osseous abnormality identified. IMPRESSION: 1. Positive for Acute Appendicitis. Trace free fluid without abscess or evidence of perforation. 2. Mild secondary inflammation of the distal small bowel. Prior gastric bypass. 3. Mild cardiomegaly. Electronically Signed   By: Odessa FlemingH  Hall M.D.   On: 04/01/2019 01:24    EKG: Independently reviewed.   Assessment/Plan Principal Problem:   Acute appendicitis Admit to MedSurg/inpatient. Keep n.p.o. Continue IV fluids. Analgesics as needed. Antiemetics as needed. Continue Zosyn 3.375 g every 8 hours IVPB. Obtain preop EKG and chest radiograph. General surgery will evaluate later today.  Active  Problems:   Vitamin B12 deficiency Check B12 level.    Chronic anemia Add on anemia panel to pretransfusion blood sample. Transfuse 2 units of PRBC in preparation for surgery. Monitor hematocrit and hemoglobin. I have advised the patient to supplement    Type 2 diabetes mellitus (HCC) Currently n.p.o. CBG monitoring every 6-8 hours. Hold metformin due to receiving contrast. Start RI SS coverage once the patient cleared for diet.    HTN (hypertension) Hold antihypertensives for now. Monitor blood pressure.    DVT prophylaxis: SCDs. Code Status: Full code. Family Communication: Disposition Plan: Admit for symptoms control, IV antibiotic therapy Consults called: Dr. Franky MachoMark Jenkins (General surgery). Admission status: Inpatient/MedSurg.   Bobette Moavid Manuel Ortiz MD [facility]  04/01/2019, 5:01 AM   This document was prepared using Dragon voice recognition software and may contain some unintended transcription errors.

## 2019-04-02 ENCOUNTER — Encounter (HOSPITAL_COMMUNITY): Payer: Self-pay | Admitting: General Surgery

## 2019-04-02 DIAGNOSIS — D508 Other iron deficiency anemias: Secondary | ICD-10-CM

## 2019-04-02 DIAGNOSIS — D649 Anemia, unspecified: Secondary | ICD-10-CM

## 2019-04-02 LAB — BPAM RBC
Blood Product Expiration Date: 202006012359
Blood Product Expiration Date: 202006022359
Blood Product Expiration Date: 202006092359
Blood Product Expiration Date: 202006102359
ISSUE DATE / TIME: 202005200411
ISSUE DATE / TIME: 202005200641
Unit Type and Rh: 6200
Unit Type and Rh: 6200
Unit Type and Rh: 6200
Unit Type and Rh: 6200

## 2019-04-02 LAB — TYPE AND SCREEN
ABO/RH(D): A POS
Antibody Screen: NEGATIVE
Unit division: 0
Unit division: 0
Unit division: 0
Unit division: 0

## 2019-04-02 LAB — COMPREHENSIVE METABOLIC PANEL
ALT: 8 U/L (ref 0–44)
AST: 11 U/L — ABNORMAL LOW (ref 15–41)
Albumin: 3.2 g/dL — ABNORMAL LOW (ref 3.5–5.0)
Alkaline Phosphatase: 51 U/L (ref 38–126)
Anion gap: 8 (ref 5–15)
BUN: 15 mg/dL (ref 6–20)
CO2: 23 mmol/L (ref 22–32)
Calcium: 8.2 mg/dL — ABNORMAL LOW (ref 8.9–10.3)
Chloride: 106 mmol/L (ref 98–111)
Creatinine, Ser: 0.58 mg/dL (ref 0.44–1.00)
GFR calc Af Amer: >60 mL/min (ref >60)
GFR calc non Af Amer: >60 mL/min (ref >60)
Glucose, Bld: 137 mg/dL — ABNORMAL HIGH (ref 70–99)
Potassium: 3.7 mmol/L (ref 3.5–5.1)
Sodium: 137 mmol/L (ref 135–145)
Total Bilirubin: 0.7 mg/dL (ref 0.3–1.2)
Total Protein: 6 g/dL — ABNORMAL LOW (ref 6.5–8.1)

## 2019-04-02 LAB — CBC WITH DIFFERENTIAL/PLATELET
Abs Immature Granulocytes: 0.07 10*3/uL (ref 0.00–0.07)
Basophils Absolute: 0 10*3/uL (ref 0.0–0.1)
Basophils Relative: 0 %
Eosinophils Absolute: 0 10*3/uL (ref 0.0–0.5)
Eosinophils Relative: 0 %
HCT: 22.2 % — ABNORMAL LOW (ref 36.0–46.0)
Hemoglobin: 6.1 g/dL — CL (ref 12.0–15.0)
Immature Granulocytes: 1 %
Lymphocytes Relative: 8 %
Lymphs Abs: 0.8 10*3/uL (ref 0.7–4.0)
MCH: 19.1 pg — ABNORMAL LOW (ref 26.0–34.0)
MCHC: 27.5 g/dL — ABNORMAL LOW (ref 30.0–36.0)
MCV: 69.6 fL — ABNORMAL LOW (ref 80.0–100.0)
Monocytes Absolute: 0.7 10*3/uL (ref 0.1–1.0)
Monocytes Relative: 7 %
Neutro Abs: 8.5 10*3/uL — ABNORMAL HIGH (ref 1.7–7.7)
Neutrophils Relative %: 84 %
Platelets: 150 10*3/uL (ref 150–400)
RBC: 3.19 MIL/uL — ABNORMAL LOW (ref 3.87–5.11)
RDW: 21.6 % — ABNORMAL HIGH (ref 11.5–15.5)
WBC: 10 10*3/uL (ref 4.0–10.5)
nRBC: 0 % (ref 0.0–0.2)

## 2019-04-02 LAB — HIV ANTIBODY (ROUTINE TESTING W REFLEX): HIV Screen 4th Generation wRfx: NONREACTIVE

## 2019-04-02 LAB — GLUCOSE, CAPILLARY
Glucose-Capillary: 138 mg/dL — ABNORMAL HIGH (ref 70–99)
Glucose-Capillary: 187 mg/dL — ABNORMAL HIGH (ref 70–99)

## 2019-04-02 LAB — PREPARE RBC (CROSSMATCH)

## 2019-04-02 MED ORDER — SODIUM CHLORIDE 0.9% IV SOLUTION: Freq: Once | INTRAVENOUS | Status: DC

## 2019-04-02 MED ORDER — VITAMIN B-12 100 MCG PO TABS: 500.0000 ug | ORAL_TABLET | Freq: Every day | ORAL | Status: DC

## 2019-04-02 MED ORDER — POLYSACCHARIDE IRON COMPLEX 150 MG PO CAPS
150.0000 mg | ORAL_CAPSULE | Freq: Every day | ORAL | Status: DC
  Administered 2019-04-02: 150 mg via ORAL
  Filled 2019-04-02: qty 1

## 2019-04-02 MED ORDER — FERROUS SULFATE 325 (65 FE) MG PO TABS
325.0000 mg | ORAL_TABLET | Freq: Two times a day (BID) | ORAL | Status: DC
  Filled 2019-04-02: qty 1

## 2019-04-02 MED ORDER — CYANOCOBALAMIN 500 MCG PO TABS: 500.0000 ug | ORAL_TABLET | Freq: Every day | ORAL | 0 refills | Status: DC

## 2019-04-02 MED ORDER — FERROUS SULFATE 325 (65 FE) MG PO TABS: 325.0000 mg | ORAL_TABLET | Freq: Two times a day (BID) | ORAL | 3 refills | Status: DC

## 2019-04-02 MED ORDER — POLYSACCHARIDE IRON COMPLEX 150 MG PO CAPS: 150.0000 mg | ORAL_CAPSULE | Freq: Every day | ORAL | 1 refills | Status: DC

## 2019-04-02 NOTE — Discharge Summary (Signed)
Physician Discharge Summary  Patient ID: Norma Carroll MRN: [id number] DOB/AGE: 1971-05-12 48 y.o.  Admit date: 03/31/2019 Discharge date: 04/02/2019  Admission Diagnoses: Acute appendicitis  Discharge Diagnoses: Same Principal Problem:   Acute appendicitis Active Problems:   Vitamin B12 deficiency   Type 2 diabetes mellitus (HCC)   HTN (hypertension)   Chronic anemia   Iron deficiency anemia   Discharged Condition: good  Hospital Course: Patient is a 48 year old white female who had perforated appendicitis in 2017 who presented with a 2-day history of worsening right lower quadrant abdominal pain.  CT scan of the abdomen revealed acute appendicitis.  She does have a history of chronic anemia and did receive 2 units of packed red blood cells prior to surgery.  She underwent laparoscopic appendectomy on 04/01/2019.  She tolerated the procedure well.  Her postoperative course has been unremarkable.  Her diet was advanced out difficulty.  CBC is pending this morning.  She is being discharged home in good and stable condition pending the results of the CBC.  Treatments: surgery: Laparoscopic appendectomy on 04/01/2019  Discharge Exam: Blood pressure 109/62, pulse 62, temperature 98 F (36.7 C), temperature source Oral, resp. rate 20, height 5\' 10"  (1.778 m), weight 96.1 kg, last menstrual period 03/22/2019, SpO2 98 %. General appearance: alert, cooperative and no distress Resp: clear to auscultation bilaterally Cardio: regular rate and rhythm, S1, S2 normal, no murmur, click, rub or gallop GI: Soft, incisions healing well.  Disposition: Discharge disposition: 01-Home or Self Care       Discharge Instructions    Diet - low sodium heart healthy   Complete by:  As directed    Increase activity slowly   Complete by:  As directed      Allergies as of 04/02/2019   No Known Allergies     Medication List    TAKE these medications   ferrous sulfate 325 (65 FE) MG  tablet Take 1 tablet (325 mg total) by mouth 2 (two) times daily with a meal.   lisinopril 20 MG tablet Commonly known as:  ZESTRIL Take 20 mg by mouth daily.   metFORMIN 1000 MG tablet Commonly known as:  GLUCOPHAGE Take 1,000 mg by mouth 2 (two) times daily.   oxyCODONE-acetaminophen 7.5-325 MG tablet Commonly known as:  Percocet Take 1 tablet by mouth every 6 (six) hours as needed.   vitamin B-12 500 MCG tablet Commonly known as:  CYANOCOBALAMIN Take 1 tablet (500 mcg total) by mouth daily. Start taking on:  Apr 03, 2019      Follow-up Information    Franky Macho, MD.   Specialty:  General Surgery Why:  If symptoms worsen Contact information: [address] [place] [address] [phone]           Signed: Franky Macho 04/02/2019, 7:22 AM

## 2019-04-02 NOTE — Progress Notes (Addendum)
PROGRESS NOTE  Norma Carroll RUE:[id number] DOB: 06-28-1971 DOA: 03/31/2019 PCP: Erasmo Downer, NP  Brief History:  48 year old female with a history of diabetes mellitus type 2, hypertension, gastric bypass, and acute appendicitis in April 2017 treated medically presenting with 2-day history of abdominal pain that started initially in the lower abdomen and became generalized.  She stated that she had some temporary relief on the morning of 03/31/2019, but developed worsening pain that was generalized again later in the evening.  As result, the patient presented for further evaluation.  The patient had some nausea but denies any vomiting, diarrhea, dysuria, hematuria, hematochezia, melena.  She denies any NSAIDs.  She does not take any over-the-counter medicines.  In fact, the patient has been noncompliant with her B12 supplementation.  Upon presentation, CT of the abdomen and pelvis showed a thickened inflamed appendix with regional inflammatory stranding near the distal small bowel.  Her small bowel anastomosis in the left upper quadrant was unremarkable.  The patient was started on IV Zosyn.  General surgery was consulted to assist with management.  Assessment/Plan: Acute appendicitis -Continue Zosyn--no further abx after surgery -Appreciate general surgery consult and followup -laparoscopic appy on 04/01/19 -Judicious pain control -CT abdomen as above  Iron deficiency anemia -Give Feraheme IV x1 on 5/20 -Start oral iron after d/c -pt states po iron previously caused GI distress-->pt willing to try different formulation-->rx Niferex after d/c -Iron saturation 3%, ferritin 3 -she was transfused 2 units PRBC pre-op and required an additional 2 units post-op -FOBT was neg  B12 deficiency -This is also contributed to the patient's anemia -FOBT negative -Patient is noncompliant with B12 supplementation -B12 IM daily while the patient is hospitalized -She will need  to restart B12 supplementation after discharge -Serum B12--106 -discharge home with B12 tabs--500 mg daily  Diabetes mellitus type 2 -Holding metformin--resume after discharge  Essential hypertension -Restart lisinopril once able to take p.o. -personally reviewed EKG--sinus, nonspecific T wave change     Disposition Plan:   Home 5/21 Family Communication:  No  Family at bedside  Consultants:  General surgery  Code Status:  FULL  DVT Prophylaxis:  [place] Lovenox   Procedures: As Listed in Progress Note Above  Antibiotics: Zosyn 5/19>>>5/20   Subjective: Pt states pain is controlled after surgery. Tolerating diet.  Denies cp, sob, n/v/d  Objective: Vitals:   04/01/19 1800 04/01/19 2159 04/02/19 0159 04/02/19 0606  BP: (!) 143/76 115/66 117/62 109/62  Pulse: 84 71 69 62  Resp: Temp: 99.6 F (37.6 C) 98.6 F (37 C) 99 F (37.2 C) 98 F (36.7 C)  TempSrc: Oral  Oral Oral  SpO2: 95% 95% 97% 98%  Weight:      Height:        Intake/Output Summary (Last 24 hours) at 04/02/2019 1478 Last data filed at 04/02/2019 0400 Gross per 24 hour  Intake 1611.15 ml  Output 610 ml  Net 1001.15 ml   Weight change:  Exam:   General:  Pt is alert, follows commands appropriately, not in acute distress  HEENT: No icterus, No thrush, No neck mass, [place]/AT  Cardiovascular: RRR, S1/S2, no rubs, no gallops  Respiratory: CTA bilaterally, no wheezing, no crackles, no rhonchi  Abdomen: Soft/+BS, incisional tender, non distended, no guarding  Extremities: No edema, No lymphangitis, No petechiae, No rashes, no synovitis   Data Reviewed: I have personally reviewed following labs and imaging studies Basic  Metabolic Panel: Recent Labs  Lab 03/31/19 2252 04/01/19 1049  NA 136 138  K 4.0 3.9  CL 103 107  CO2 23 24  GLUCOSE 173* 108*  BUN 15 13  CREATININE 0.70 0.65  CALCIUM 8.6* 8.2*   Liver Function Tests: Recent Labs  Lab 03/31/19 2252 04/01/19  1049  AST 14* 11*  ALT 10 8  ALKPHOS 61 55  BILITOT 0.3 2.3*  PROT 7.1 6.2*  ALBUMIN 4.0 3.5   Recent Labs  Lab 03/31/19 2252  LIPASE 37   No results for input(s): AMMONIA in the last 168 hours. Coagulation Profile: No results for input(s): INR, PROTIME in the last 168 hours. CBC: Recent Labs  Lab 03/31/19 2252 04/01/19 1049  WBC 8.5 13.5*  NEUTROABS  --  11.9*  HGB 6.3* 7.0*  HCT 24.4* 25.4*  MCV 66.7* 69.0*  PLT 215 162   Cardiac Enzymes: No results for input(s): CKTOTAL, CKMB, CKMBINDEX, TROPONINI in the last 168 hours. BNP: Invalid input(s): POCBNP CBG: Recent Labs  Lab 04/01/19 1119 04/01/19 1313 04/01/19 1854 04/02/19 0013 04/02/19 0610  GLUCAP 101* 131* 249* 187* 138*   HbA1C: No results for input(s): HGBA1C in the last 72 hours. Urine analysis:    Component Value Date/Time   COLORURINE AMBER (A) 03/31/2019 2330   APPEARANCEUR CLEAR 03/31/2019 2330   LABSPEC 1.014 03/31/2019 2330   PHURINE 6.0 03/31/2019 2330   GLUCOSEU 150 (A) 03/31/2019 2330   HGBUR NEGATIVE 03/31/2019 2330   BILIRUBINUR NEGATIVE 03/31/2019 2330   KETONESUR NEGATIVE 03/31/2019 2330   PROTEINUR NEGATIVE 03/31/2019 2330   NITRITE POSITIVE (A) 03/31/2019 2330   LEUKOCYTESUR NEGATIVE 03/31/2019 2330   Sepsis Labs: (procalcitonin:4,lacticidven:4) ) Recent Results (from the past 240 hour(s))  SARS Coronavirus 2 (CEPHEID - Performed in [facility] hospital lab), Hosp Order     Status: None   Collection Time: 04/01/19  1:46 AM  Result Value Ref Range Status   SARS Coronavirus 2 NEGATIVE NEGATIVE Final    Comment: (NOTE) If result is NEGATIVE SARS-CoV-2 target nucleic acids are NOT DETECTED. The SARS-CoV-2 RNA is generally detectable in upper and lower  respiratory specimens during the acute phase of infection. The lowest  concentration of SARS-CoV-2 viral copies this assay can detect is 250  copies / mL. A negative result does not preclude SARS-CoV-2 infection  and  should not be used as the sole basis for treatment or other  patient management decisions.  A negative result may occur with  improper specimen collection / handling, submission of specimen other  than nasopharyngeal swab, presence of viral mutation(s) within the  areas targeted by this assay, and inadequate number of viral copies  (<250 copies / mL). A negative result must be combined with clinical  observations, patient history, and epidemiological information. If result is POSITIVE SARS-CoV-2 target nucleic acids are DETECTED. The SARS-CoV-2 RNA is generally detectable in upper and lower  respiratory specimens dur ing the acute phase of infection.  Positive  results are indicative of active infection with SARS-CoV-2.  Clinical  correlation with patient history and other diagnostic information is  necessary to determine patient infection status.  Positive results do  not rule out bacterial infection or co-infection with other viruses. If result is PRESUMPTIVE POSTIVE SARS-CoV-2 nucleic acids MAY BE PRESENT.   A presumptive positive result was obtained on the submitted specimen  and confirmed on repeat testing.  While 2019 novel coronavirus  (SARS-CoV-2) nucleic acids may be present in the submitted sample  additional  confirmatory testing may be necessary for epidemiological  and / or clinical management purposes  to differentiate between  SARS-CoV-2 and other Sarbecovirus currently known to infect humans.  If clinically indicated additional testing with an alternate test  methodology [phone]([id number]) is advised. The SARS-CoV-2 RNA is generally  detectable in upper and lower respiratory sp ecimens during the acute  phase of infection. The expected result is Negative. Fact Sheet for Patients:  BoilerBrush.com.cyhttps://www.fda.gov/media/136312/download Fact Sheet for Healthcare Providers: https://pope.com/https://www.fda.gov/media/136313/download This test is not yet approved or cleared by the [place] FDA and has been  authorized for detection and/or diagnosis of SARS-CoV-2 by FDA under an Emergency Use Authorization (EUA).  This EUA will remain in effect (meaning this test can be used) for the duration of the COVID-19 declaration under Section 564(b)(1) of the Act, 21 U.S.C. section 360bbb-3(b)(1), unless the authorization is terminated or revoked sooner. Performed at [facility], [address] [place], [place] [address]      Scheduled Meds: . cyanocobalamin  1,000 mcg Intramuscular Daily  . enoxaparin (LOVENOX) injection  40 mg Subcutaneous Q24H  . iron polysaccharides  150 mg Oral Daily  . pantoprazole (PROTONIX) IV  40 mg Intravenous Q24H  . [START ON 04/03/2019] vitamin B-12  500 mcg Oral Daily   Continuous Infusions: . sodium chloride 500 mL (04/01/19 1006)  . sodium chloride 75 mL/hr at 04/01/19 1855  . piperacillin-tazobactam 3.375 g (04/02/19 0138)    Procedures/Studies: Ct Abdomen Pelvis W Contrast  Result Date: 04/01/2019 CLINICAL DATA:  48 year old female with 2 days of abdominal pain and distension. Postprandial pain tonight. EXAM: CT ABDOMEN AND PELVIS WITH CONTRAST TECHNIQUE: Multidetector CT imaging of the abdomen and pelvis was performed using the standard protocol following bolus administration of intravenous contrast. CONTRAST:  100mL OMNIPAQUE IOHEXOL 300 MG/ML  SOLN COMPARISON:  CT Abdomen and Pelvis 07/16/2016. FINDINGS: Lower chest: Mild cardiomegaly. No pericardial effusion. Negative lung bases aside from costophrenic angle atelectasis. Hepatobiliary: Mildly distended gallbladder. No pericholecystic inflammation. Negative liver. Pancreas: Negative. Spleen: Negative. Adrenals/Urinary Tract: Normal adrenal glands. Bilateral renal enhancement and contrast excretion is symmetric and within normal limits. Punctate right nephrolithiasis. Unremarkable urinary bladder. Stomach/Bowel: Negative rectosigmoid colon aside from sigmoid redundancy. Negative left colon. Mildly redundant  transverse colon is negative. Negative ascending colon. Thickened and inflamed appendix which courses medially from the cecum on coronal image 49 with regional inflammatory stranding. Appendix: Location: Tracks medial from the cecum on series 2, image 62. Diameter: Up to 12 millimeters Appendicolith: Not identified Mucosal hyper-enhancement: Mild Extraluminal gas: Negative. Periappendiceal collection: Trace free fluid only. The terminal ileum and ileocecal valve are demonstrated on coronal image 47. There is mild if any secondary inflammation of the TI. Fluid-filled but nondilated small bowel in the lower abdomen and pelvis. Trace right lower quadrant free fluid. Prior gastric bypass. There is a small bowel anastomosis in the left upper quadrant with no adverse features. No free air. Vascular/Lymphatic: Mild Calcified aortic atherosclerosis. Major arterial structures are patent. Portal venous system is patent. No lymphadenopathy. Reproductive: Negative; physiologic left ovarian cysts suspected with simple fluid density on series 2, image 78. Other: No pelvic free fluid. 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 Stable mild L5-S1 spondylolisthesis. No acute osseous abnormality identified. IMPRESSION: 1. Positive for Acute Appendicitis. Trace free fluid without abscess or evidence of perforation. 2. Mild secondary inflammation of the distal small bowel. Prior gastric bypass. 3. Mild cardiomegaly. Electronically Signed   By: Althea GrimmerH  Hall M.D.  On: 04/01/2019 01:24   Dg Chest Port 1 View  Result Date: 04/01/2019 CLINICAL DATA:  Nausea, upper abdominal pain EXAM: PORTABLE CHEST 1 VIEW COMPARISON:  07/16/2016 FINDINGS: The heart size and mediastinal contours are within normal limits. Both lungs are clear. The visualized skeletal structures are unremarkable. IMPRESSION: No active disease. Electronically Signed   By: Elige Ko   On: 04/01/2019 08:55     Catarina Hartshorn, DO  [facility] Pager [phone]  If 7PM-7AM, please contact night-coverage [email or web address] Password [email or web address] 04/02/2019, 7:42 AM   LOS: 1 day

## 2019-04-02 NOTE — Progress Notes (Addendum)
Critical hgb 6.1.  Notified Dr Lovell Sheehan and Dr. Arbutus Leas by computer chat.  Saw Dr Lovell Sheehan in hallway and he said ok to go home that this is her baseline. Lap appi sites to abd intact with glue.  IV removed and discharge instructions reviewed.  Dr. Lovell Sheehan will call patient for follow up appt. Family to drive home

## 2019-04-09 ENCOUNTER — Telehealth (INDEPENDENT_AMBULATORY_CARE_PROVIDER_SITE_OTHER): Payer: Self-pay | Admitting: General Surgery

## 2019-04-09 DIAGNOSIS — Z09 Encounter for follow-up examination after completed treatment for conditions other than malignant neoplasm: Secondary | ICD-10-CM | POA: Insufficient documentation

## 2019-04-09 NOTE — Telephone Encounter (Signed)
Patient called, no answer.  Could not leave message.

## 2020-11-10 IMAGING — CT CT ABDOMEN AND PELVIS WITH CONTRAST
2 of 4 series · 16 of 46 positions shown, 18 images · IV contrast (Isovue)
Comparison: CT Abdomen and Pelvis 07/16/2016.

CLINICAL DATA: 47-year-old female with 2 days of abdominal pain and
distension. Postprandial pain tonight.

EXAM:
CT ABDOMEN AND PELVIS WITH CONTRAST
TECHNIQUE: Multidetector CT imaging of the abdomen and pelvis was performed
using the standard protocol following bolus administration of
intravenous contrast.
CONTRAST:  100mL OMNIPAQUE IOHEXOL 300 MG/ML  SOLN

[Series 2: axial st · axial · 0.96mm/px · z∈[+838,+1283]mm · 13 of 99 slices shown, 15 images]
[im 5/99  soft-tissue]
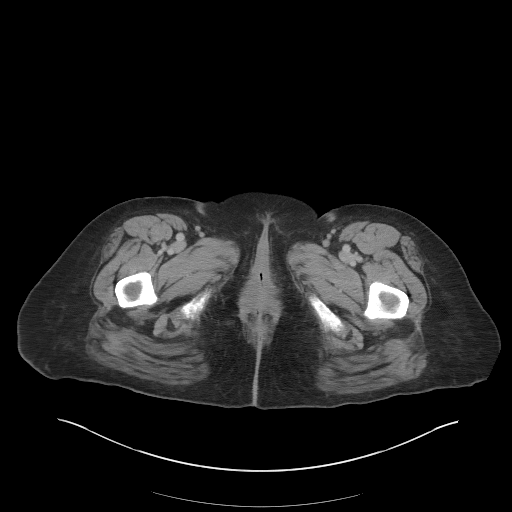
[im 5/99  bone]
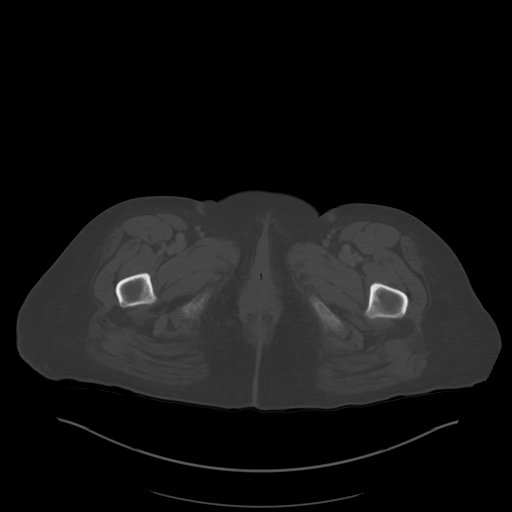
[im 15/99  soft-tissue]
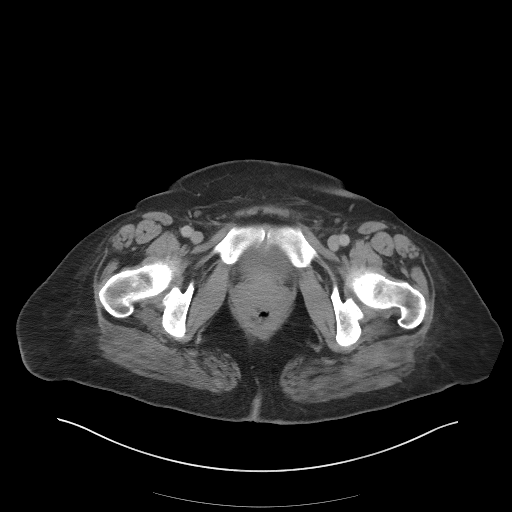
[im 19/99  soft-tissue]
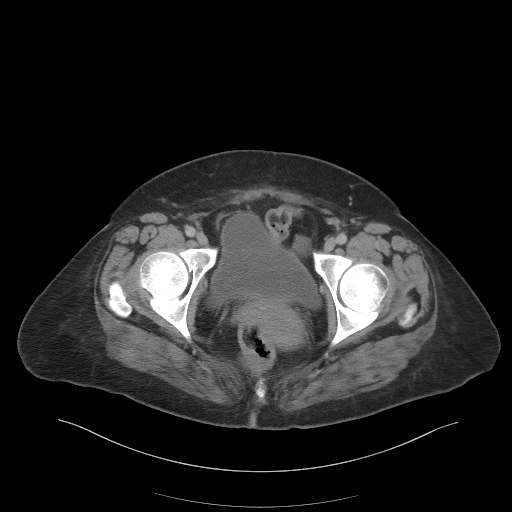
[im 29/99  soft-tissue]
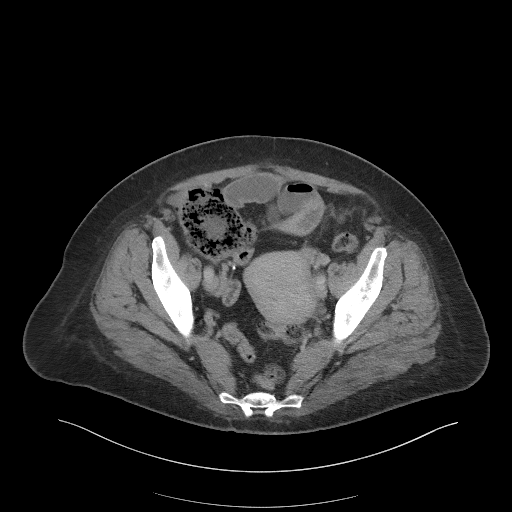
[im 33/99  soft-tissue]
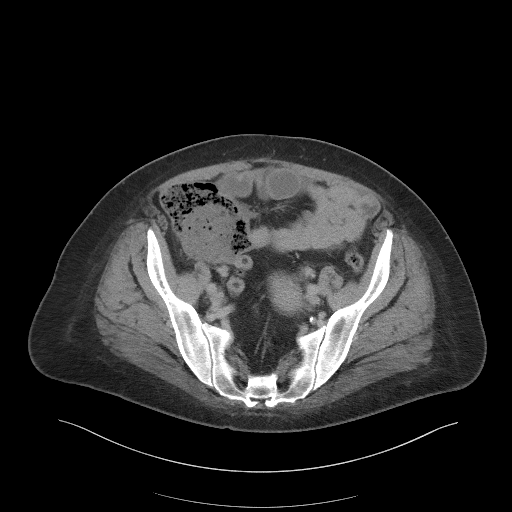
[im 43/99  soft-tissue]
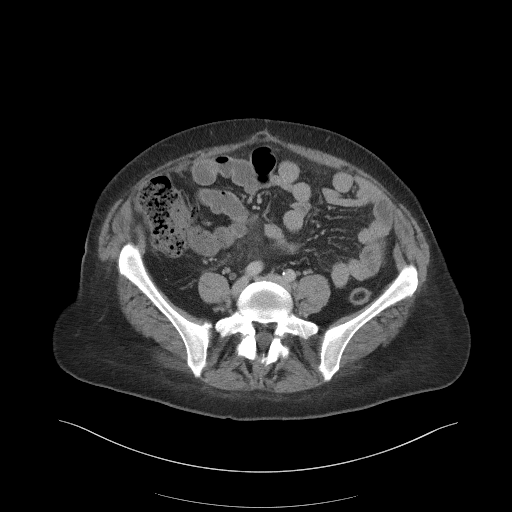
[im 52/99  soft-tissue]
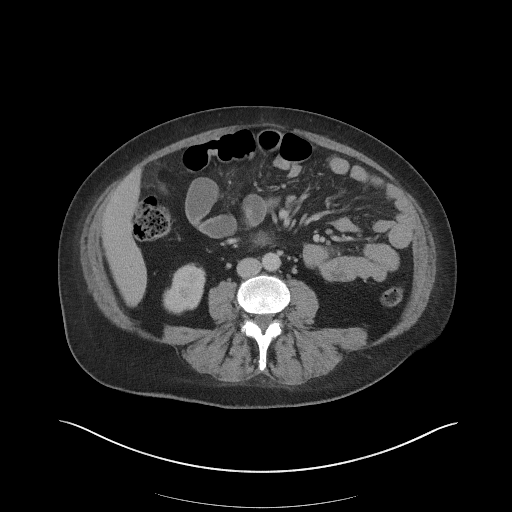
[im 57/99  soft-tissue]
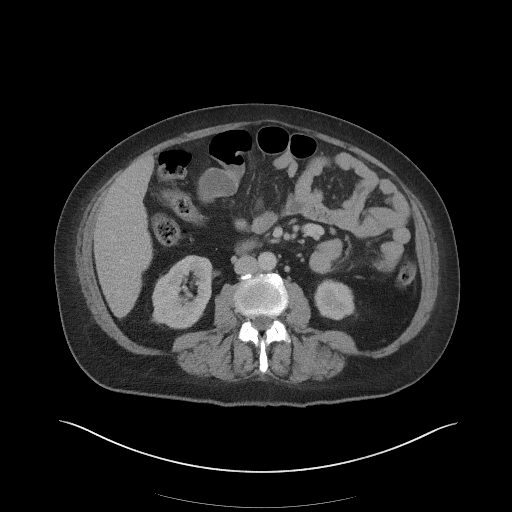
[im 66/99  soft-tissue]
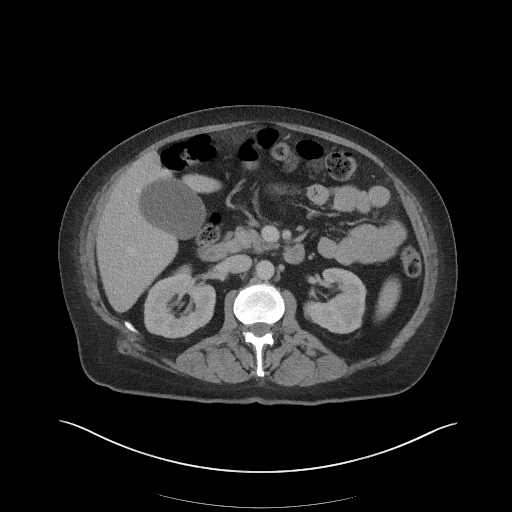
[im 66/99  bone]
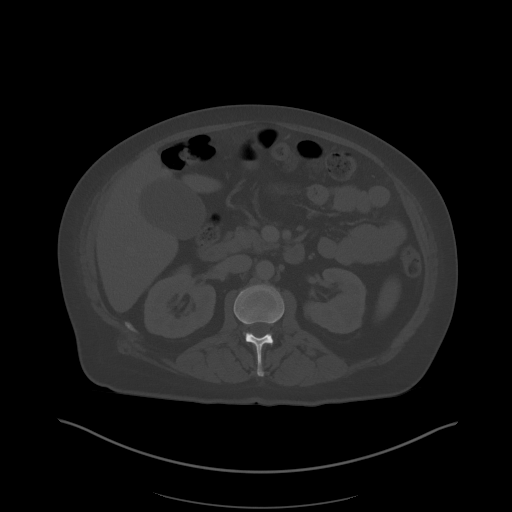
[im 71/99  soft-tissue]
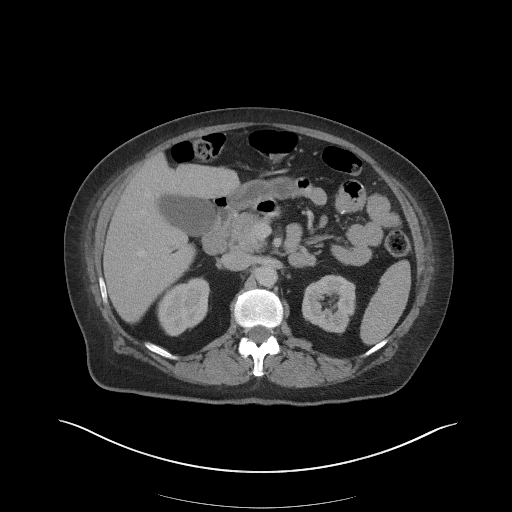
[im 80/99  soft-tissue]
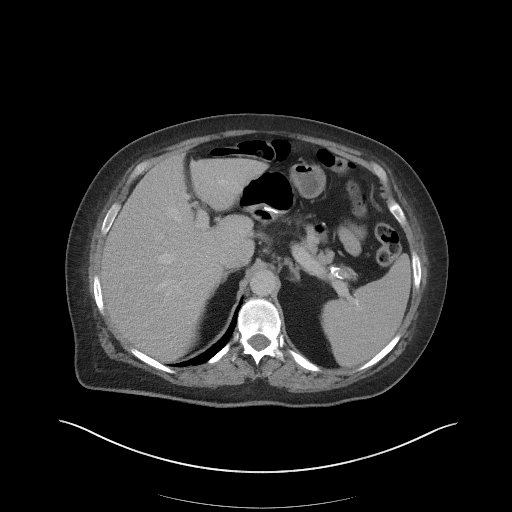
[im 85/99  soft-tissue]
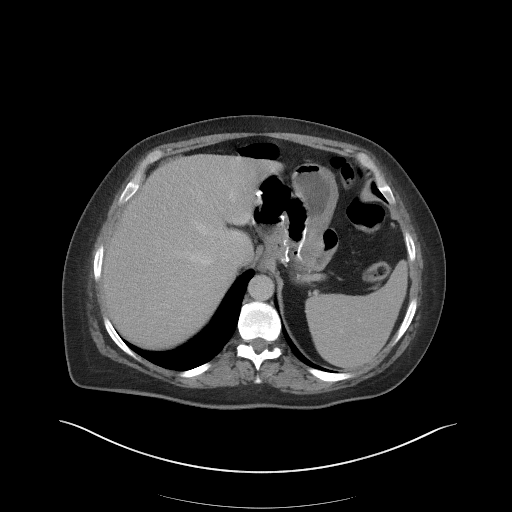
[im 94/99  soft-tissue]
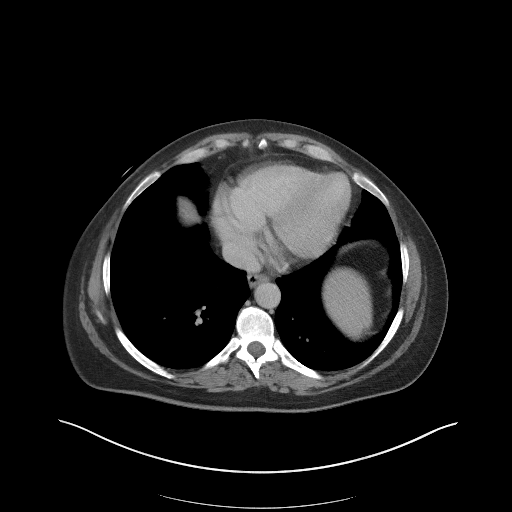

[Series 5: coronal st · coronal · 0.86mm/px · 3 of 105 slices shown]
[im 35/105  soft-tissue]
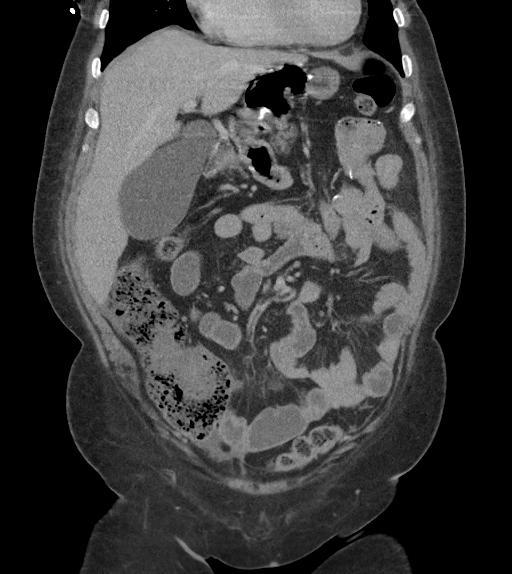
[im 47/105  soft-tissue]
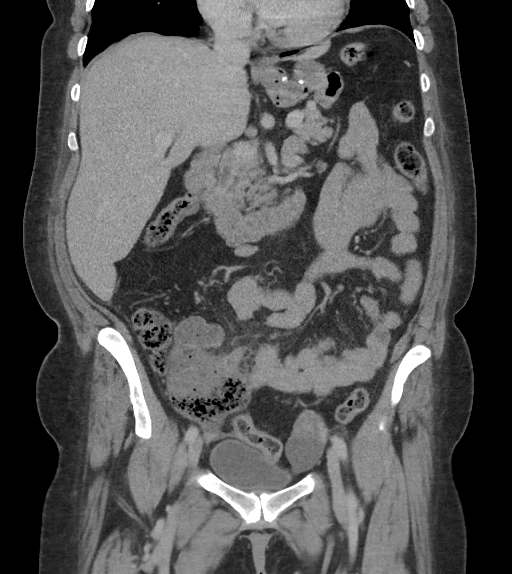
[im 58/105  soft-tissue]
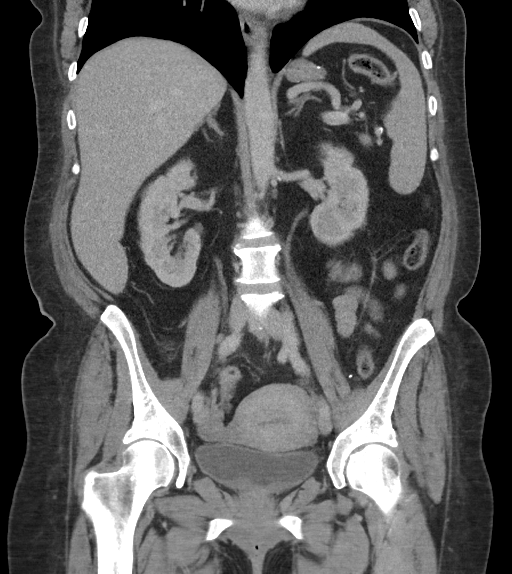

[16 of 46 positions shown; findings below may reference images not displayed]

FINDINGS: Lower chest: Mild cardiomegaly. No pericardial effusion. Negative
lung bases aside from costophrenic angle atelectasis.

Hepatobiliary: Mildly distended gallbladder. No pericholecystic
inflammation. Negative liver.

Pancreas: Negative.

Spleen: Negative.

Adrenals/Urinary Tract: Normal adrenal glands. Bilateral renal
enhancement and contrast excretion is symmetric and within normal
limits. Punctate right nephrolithiasis. Unremarkable urinary
bladder.

Stomach/Bowel: Negative rectosigmoid colon aside from sigmoid
redundancy. Negative left colon. Mildly redundant transverse colon
is negative. Negative ascending colon.

Thickened and inflamed appendix which courses medially from the
cecum on coronal image 49 with regional inflammatory stranding.

Appendix: Location: Tracks medial from the cecum on series 2, image
62.

Diameter: Up to 12 millimeters

Appendicolith: Not identified

Mucosal hyper-enhancement: Mild

Extraluminal gas: Negative.

Periappendiceal collection: Trace free fluid only.

The terminal ileum and ileocecal valve are demonstrated on coronal
image 47. There is mild if any secondary inflammation of the TI.
Fluid-filled but nondilated small bowel in the lower abdomen and
pelvis. Trace right lower quadrant free fluid. Prior gastric bypass.
There is a small bowel anastomosis in the left upper quadrant with
no adverse features. No free air.

Vascular/Lymphatic: Mild Calcified aortic atherosclerosis. Major
arterial structures are patent. Portal venous system is patent.

No lymphadenopathy.

Reproductive: Negative; physiologic left ovarian cysts suspected
with simple fluid density on series 2, image 78.

Other: No pelvic free fluid.

Musculoskeletal: Lower lumbar spina bifida occulta (normal variant
with superimposed chronic L5 pars fractures. Stable mild L5-S1
spondylolisthesis. No acute osseous abnormality identified.
IMPRESSION: 1. Positive for Acute Appendicitis. Trace free fluid without abscess
or evidence of perforation.
2. Mild secondary inflammation of the distal small bowel. Prior
gastric bypass.
3. Mild cardiomegaly.

## 2021-02-16 ENCOUNTER — Other Ambulatory Visit (HOSPITAL_COMMUNITY): Payer: Self-pay

## 2021-02-16 DIAGNOSIS — D508 Other iron deficiency anemias: Secondary | ICD-10-CM

## 2021-02-17 ENCOUNTER — Inpatient Hospital Stay (HOSPITAL_COMMUNITY): Payer: 59 | Attending: Hematology

## 2021-02-17 ENCOUNTER — Other Ambulatory Visit: Payer: Self-pay

## 2021-02-17 DIAGNOSIS — Z79899 Other long term (current) drug therapy: Secondary | ICD-10-CM | POA: Insufficient documentation

## 2021-02-17 DIAGNOSIS — Z7984 Long term (current) use of oral hypoglycemic drugs: Secondary | ICD-10-CM | POA: Insufficient documentation

## 2021-02-17 DIAGNOSIS — Z8249 Family history of ischemic heart disease and other diseases of the circulatory system: Secondary | ICD-10-CM | POA: Insufficient documentation

## 2021-02-17 DIAGNOSIS — E119 Type 2 diabetes mellitus without complications: Secondary | ICD-10-CM | POA: Diagnosis not present

## 2021-02-17 DIAGNOSIS — I10 Essential (primary) hypertension: Secondary | ICD-10-CM | POA: Diagnosis not present

## 2021-02-17 DIAGNOSIS — Z833 Family history of diabetes mellitus: Secondary | ICD-10-CM | POA: Insufficient documentation

## 2021-02-17 DIAGNOSIS — K909 Intestinal malabsorption, unspecified: Secondary | ICD-10-CM | POA: Insufficient documentation

## 2021-02-17 DIAGNOSIS — Z9884 Bariatric surgery status: Secondary | ICD-10-CM | POA: Diagnosis not present

## 2021-02-17 DIAGNOSIS — D508 Other iron deficiency anemias: Secondary | ICD-10-CM | POA: Diagnosis present

## 2021-02-17 LAB — CBC WITH DIFFERENTIAL/PLATELET
Abs Immature Granulocytes: 0.04 10*3/uL (ref 0.00–0.07)
Basophils Absolute: 0 10*3/uL (ref 0.0–0.1)
Basophils Relative: 1 %
Eosinophils Absolute: 0.2 10*3/uL (ref 0.0–0.5)
Eosinophils Relative: 2 %
HCT: 27.4 % — ABNORMAL LOW (ref 36.0–46.0)
Hemoglobin: 7.1 g/dL — ABNORMAL LOW (ref 12.0–15.0)
Immature Granulocytes: 1 %
Lymphocytes Relative: 20 %
Lymphs Abs: 1.7 10*3/uL (ref 0.7–4.0)
MCH: 16.9 pg — ABNORMAL LOW (ref 26.0–34.0)
MCHC: 25.9 g/dL — ABNORMAL LOW (ref 30.0–36.0)
MCV: 65.2 fL — ABNORMAL LOW (ref 80.0–100.0)
Monocytes Absolute: 0.6 10*3/uL (ref 0.1–1.0)
Monocytes Relative: 7 %
Neutro Abs: 6.2 10*3/uL (ref 1.7–7.7)
Neutrophils Relative %: 69 %
Platelets: 290 10*3/uL (ref 150–400)
RBC: 4.2 MIL/uL (ref 3.87–5.11)
RDW: 19.6 % — ABNORMAL HIGH (ref 11.5–15.5)
WBC: 8.8 10*3/uL (ref 4.0–10.5)
nRBC: 0 % (ref 0.0–0.2)

## 2021-02-17 LAB — SAMPLE TO BLOOD BANK

## 2021-02-17 NOTE — Progress Notes (Signed)
[facility] CONSULT NOTE  Patient Care Team: Erasmo Downer, NP as PCP - General (Nurse Practitioner)  CHIEF COMPLAINTS/PURPOSE OF CONSULTATION:  Severe anemia.  ASSESSMENT & PLAN:   No problem-specific Assessment & Plan notes found for this encounter.  Orders Placed This Encounter  Procedures  . CBC with Differential/Platelet    Standing Status:   Standing    Number of Occurrences:   22    Standing Expiration Date:   02/20/2022  . Ferritin    Standing Status:   Future    Standing Expiration Date:   02/20/2022   This is a very pleasant 50 year old female patient with past medical history significant for gastric bypass surgery, type 2 diabetes mellitus, hypertension referred to hematology for management and recommendations regarding severe iron deficiency anemia.  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  She absolutely denies any changes in bowel habits, hematochezia, melena or family history of colon cancer.  And she feels quite well except for some intermittent fatigue.  Physical examination today, no concerns. I reviewed her labs which showed severe microcytosis, hypochromia and anemia consistent with iron deficiency.  We do not have a baseline ferritin which will be drawn today.  No evidence of other nutritional deficiencies.  We have discussed about proceeding with Venofer IV for management of iron deficiency anemia.  We have discussed less than 1% risk of severe allergic/anaphylactic reaction with iron formularies.  Common side effects are flulike symptoms, headaches for a day or so after infusion.  She is agreeable to proceeding with iron infusion. She was also encouraged to schedule colonoscopy since she is 49 and mammogram.  I have discussed that she will qualify for genetic testing because her dad died from metastatic prostate cancer.  She would like to think about this. Return  to clinic in 3 months with labs. Thank you for consulting [place] in the care of this patient.  Please not hesitate to contact us with any additional questions or concerns.  HISTORY OF PRESENTING ILLNESS:   Norma Carroll 50 y.o. female is here because of Anemia  This is a very pleasant 50 year old female patient with past medical history significant for gastric bypass surgery, type 2 diabetes mellitus, hypertension referred to hematology for management of severe iron deficiency anemia.  [name] arrived to the appointment today by herself.  She complains of some fatigue, otherwise denies any symptoms.  No shortness of breath with exertion, pica, change in bowel habits, hematochezia, melena or hematuria.  She has regular menstrual cycles which have actually become a bit lighter according to the patient.  She never had a colonoscopy.  She denies any family history of colon cancer.  She overall feels well and does not think she needs a blood transfusion.  She is however acceptable to iron infusion.  She remembers having had an iron infusion before about 5 years ago and also required blood transfusion about 2 years ago.  Rest of the pertinent 10 point ROS reviewed and negative.  REVIEW OF SYSTEMS:   Constitutional: Denies fevers, chills or abnormal night sweats Eyes: Denies blurriness of vision, double vision or watery eyes Ears, nose, mouth, throat, and face: Denies mucositis or sore throat Respiratory: Denies cough, dyspnea or wheezes Cardiovascular: Denies palpitation, chest discomfort or lower extremity swelling Gastrointestinal:  Denies nausea, heartburn or change in bowel habits Skin: Denies abnormal skin rashes Lymphatics: Denies new lymphadenopathy or easy bruising  Neurological:Denies numbness, tingling or new weaknesses Behavioral/Psych: Mood is stable, no new changes  All other systems were reviewed with the patient and are negative.  MEDICAL HISTORY:  Past Medical History:   Diagnosis Date  . Anemia   . Blood transfusion without reported diagnosis 2007   after gastric bypass surgery  . Diabetes mellitus without complication (HCC) 2003  . Hypertension 2008    SURGICAL HISTORY: Past Surgical History:  Procedure Laterality Date  . CESAREAN SECTION  [id number] and 1997  . LAPAROSCOPIC APPENDECTOMY N/A 04/01/2019   Procedure: APPENDECTOMY LAPAROSCOPIC;  Surgeon: Franky Macho, MD;  Location: [facility];  Service: General;  Laterality: N/A;  . LAPAROSCOPIC GASTRIC BYPASS  2007    SOCIAL HISTORY: Social History   Socioeconomic History  . Marital status: Married    Spouse name: Not on file  . Number of children: Not on file  . Years of education: Not on file  . Highest education level: Not on file  Occupational History  . Not on file  Tobacco Use  . Smoking status: Never Smoker  . Smokeless tobacco: Never Used  Vaping Use  . Vaping Use: Never used  Substance and Sexual Activity  . Alcohol use: No  . Drug use: No  . Sexual activity: Yes  Other Topics Concern  . Not on file  Social History Narrative  . Not on file   Social Determinants of Health   Financial Resource Strain: Low Risk   . Difficulty of Paying Living Expenses: Not hard at all  Food Insecurity: No Food Insecurity  . Worried About Programme researcher, broadcasting/film/video in the Last Year: Never true  . Ran Out of Food in the Last Year: Never true  Transportation Needs: No Transportation Needs  . Lack of Transportation (Medical): No  . Lack of Transportation (Non-Medical): No  Physical Activity: Inactive  . Days of Exercise per Week: 0 days  . Minutes of Exercise per Session: 0 min  Stress: No Stress Concern Present  . Feeling of Stress : Only a little  Social Connections: Socially Isolated  . Frequency of Communication with Friends and Family: Never  . Frequency of Social Gatherings with Friends and Family: Never  . Attends Religious Services: Never  . Active Member of Clubs or Organizations: No  .  Attends Banker Meetings: Never  . Marital Status: Married  Catering manager Violence: Not At Risk  . Fear of Current or Ex-Partner: No  . Emotionally Abused: No  . Physically Abused: No  . Sexually Abused: No    FAMILY HISTORY: Family History  Problem Relation Age of Onset  . Heart attack Father   . Hypertension Father   . Diabetes Father     ALLERGIES:  has No Known Allergies.  MEDICATIONS:  Current Outpatient Medications  Medication Sig Dispense Refill  . lisinopril (PRINIVIL,ZESTRIL) 20 MG tablet Take 20 mg by mouth daily.    . metFORMIN (GLUCOPHAGE) 1000 MG tablet Take 1,000 mg by mouth 2 (two) times daily.     No current facility-administered medications for this visit.     PHYSICAL EXAMINATION:  ECOG PERFORMANCE STATUS: 0 - Asymptomatic  Vitals:   02/20/21 0828  BP: 132/65  Pulse: 72  Resp: 18  Temp: (!) 97.2 F (36.2 C)  SpO2: 100%   Filed Weights   02/20/21 0828  Weight: 218 lb 12.8 oz (99.2 kg)    GENERAL:alert, no distress and comfortable SKIN: skin color, texture, turgor are normal, no rashes or significant  lesions EYES: normal, conjunctiva are pink and non-injected, sclera clear OROPHARYNX:no exudate, no erythema and lips, buccal mucosa, and tongue normal  NECK: supple, thyroid normal size, non-tender, without nodularity LYMPH:  no palpable lymphadenopathy in the cervical, axillary or inguinal LUNGS: clear to auscultation and percussion with normal breathing effort HEART: regular rate & rhythm and no murmurs and no lower extremity edema ABDOMEN:abdomen soft, non-tender and normal bowel sounds Musculoskeletal:no cyanosis of digits and no clubbing  PSYCH: alert & oriented x 3 with fluent speech NEURO: no focal motor/sensory deficits  LABORATORY DATA:  I have reviewed the data as listed Lab Results  Component Value Date   WBC 8.8 02/17/2021   HGB 7.1 (L) 02/17/2021   HCT 27.4 (L) 02/17/2021   MCV 65.2 (L) 02/17/2021   PLT 290  02/17/2021     Chemistry      Component Value Date/Time   NA 137 04/02/2019 0701   K 3.7 04/02/2019 0701   CL 106 04/02/2019 0701   CO2 23 04/02/2019 0701   BUN 15 04/02/2019 0701   CREATININE 0.58 04/02/2019 0701      Component Value Date/Time   CALCIUM 8.2 (L) 04/02/2019 0701   ALKPHOS 51 04/02/2019 0701   AST 11 (L) 04/02/2019 0701   ALT 8 04/02/2019 0701   BILITOT 0.7 04/02/2019 0701     Labs reviewed  WBC 12.7, Hb of 6.9, MCV of 61.9, Platelet count of 359K ANC 8979 Hb A1C of 6.3 Platelets 359 Normal TSH Normal LFT and creatinine. B 12 and folate normal Iron panel serum iron of 12, I didn't see a ferritin  RADIOGRAPHIC STUDIES: I have personally reviewed the radiological images as listed and agreed with the findings in the report. No results found.  All questions were answered. The patient knows to call the clinic with any problems, questions or concerns. I spent 45 minutes in the care of this patient including H and P, review of records, counseling and coordination of care.     Rachel Moulds, MD 02/20/2021 9:15 AM

## 2021-02-20 ENCOUNTER — Other Ambulatory Visit: Payer: Self-pay

## 2021-02-20 ENCOUNTER — Inpatient Hospital Stay (HOSPITAL_COMMUNITY): Payer: 59

## 2021-02-20 ENCOUNTER — Other Ambulatory Visit: Payer: Self-pay | Admitting: Hematology and Oncology

## 2021-02-20 ENCOUNTER — Inpatient Hospital Stay (HOSPITAL_COMMUNITY): Payer: 59 | Admitting: Hematology and Oncology

## 2021-02-20 ENCOUNTER — Encounter (HOSPITAL_COMMUNITY): Payer: Self-pay | Admitting: Hematology and Oncology

## 2021-02-20 VITALS — BP 132/65 | HR 72 | Temp 97.2°F | Resp 18 | Ht 70.0 in | Wt 218.8 lb

## 2021-02-20 DIAGNOSIS — E119 Type 2 diabetes mellitus without complications: Secondary | ICD-10-CM

## 2021-02-20 DIAGNOSIS — Z7984 Long term (current) use of oral hypoglycemic drugs: Secondary | ICD-10-CM

## 2021-02-20 DIAGNOSIS — Z9884 Bariatric surgery status: Secondary | ICD-10-CM

## 2021-02-20 DIAGNOSIS — I10 Essential (primary) hypertension: Secondary | ICD-10-CM

## 2021-02-20 DIAGNOSIS — Z79899 Other long term (current) drug therapy: Secondary | ICD-10-CM

## 2021-02-20 DIAGNOSIS — Z8249 Family history of ischemic heart disease and other diseases of the circulatory system: Secondary | ICD-10-CM

## 2021-02-20 DIAGNOSIS — Z833 Family history of diabetes mellitus: Secondary | ICD-10-CM

## 2021-02-20 DIAGNOSIS — D508 Other iron deficiency anemias: Secondary | ICD-10-CM

## 2021-02-20 DIAGNOSIS — K909 Intestinal malabsorption, unspecified: Secondary | ICD-10-CM | POA: Diagnosis not present

## 2021-02-20 LAB — CBC WITH DIFFERENTIAL/PLATELET
Abs Immature Granulocytes: 0.04 10*3/uL (ref 0.00–0.07)
Basophils Absolute: 0 10*3/uL (ref 0.0–0.1)
Basophils Relative: 1 %
Eosinophils Absolute: 0.2 10*3/uL (ref 0.0–0.5)
Eosinophils Relative: 2 %
HCT: 27.9 % — ABNORMAL LOW (ref 36.0–46.0)
Hemoglobin: 7.2 g/dL — ABNORMAL LOW (ref 12.0–15.0)
Immature Granulocytes: 1 %
Lymphocytes Relative: 17 %
Lymphs Abs: 1.5 10*3/uL (ref 0.7–4.0)
MCH: 16.8 pg — ABNORMAL LOW (ref 26.0–34.0)
MCHC: 25.8 g/dL — ABNORMAL LOW (ref 30.0–36.0)
MCV: 65.2 fL — ABNORMAL LOW (ref 80.0–100.0)
Monocytes Absolute: 0.6 10*3/uL (ref 0.1–1.0)
Monocytes Relative: 6 %
Neutro Abs: 6.5 10*3/uL (ref 1.7–7.7)
Neutrophils Relative %: 73 %
Platelets: 304 10*3/uL (ref 150–400)
RBC: 4.28 MIL/uL (ref 3.87–5.11)
RDW: 19.3 % — ABNORMAL HIGH (ref 11.5–15.5)
WBC: 8.8 10*3/uL (ref 4.0–10.5)
nRBC: 0 % (ref 0.0–0.2)

## 2021-02-20 LAB — FERRITIN: Ferritin: 2 ng/mL — ABNORMAL LOW (ref 11–307)

## 2021-02-21 ENCOUNTER — Encounter (HOSPITAL_COMMUNITY): Payer: Self-pay

## 2021-02-21 ENCOUNTER — Inpatient Hospital Stay (HOSPITAL_COMMUNITY): Payer: 59

## 2021-02-21 VITALS — BP 99/59 | HR 68 | Temp 97.1°F | Resp 18

## 2021-02-21 DIAGNOSIS — D508 Other iron deficiency anemias: Secondary | ICD-10-CM

## 2021-02-21 DIAGNOSIS — E538 Deficiency of other specified B group vitamins: Secondary | ICD-10-CM

## 2021-02-21 MED ORDER — SODIUM CHLORIDE 0.9 % IV SOLN: Freq: Once | INTRAVENOUS | Status: AC

## 2021-02-21 MED ORDER — SODIUM CHLORIDE 0.9 % IV SOLN
200.0000 mg | Freq: Once | INTRAVENOUS | Status: AC
  Administered 2021-02-21: 200 mg via INTRAVENOUS
  Filled 2021-02-21: qty 200

## 2021-02-21 NOTE — Progress Notes (Signed)
Iron infusion given per orders. Patient tolerated it well without problems. Vitals stable and discharged home from clinic ambulatory. Follow up as scheduled.  

## 2021-02-21 NOTE — Patient Instructions (Signed)
[facility] at [facility]  Discharge Instructions:   _______________________________________________________________  Thank you for choosing [facility] at [facility] to provide your oncology and hematology care.  To afford each patient quality time with our providers, please arrive at least 15 minutes before your scheduled appointment.  You need to re-schedule your appointment if you arrive 10 or more minutes late.  We strive to give you quality time with our providers, and arriving late affects you and other patients whose appointments are after yours.  Also, if you no show three or more times for appointments you may be dismissed from the clinic.  Again, thank you for choosing [facility] at [facility]. Our hope is that these requests will allow you access to exceptional care and in a timely manner. _______________________________________________________________  If you have questions after your visit, please contact our office at [phone] between the hours of 8:30 a.m. and 5:00 p.m. Voicemails left after 4:30 p.m. will not be returned until the following business day. _______________________________________________________________  For prescription refill requests, have your pharmacy contact our office. _______________________________________________________________  Recommendations made by the consultant and any test results will be sent to your referring physician. _______________________________________________________________ 

## 2021-02-27 ENCOUNTER — Encounter (HOSPITAL_COMMUNITY): Payer: Self-pay

## 2021-02-27 ENCOUNTER — Other Ambulatory Visit: Payer: Self-pay

## 2021-02-27 ENCOUNTER — Inpatient Hospital Stay (HOSPITAL_COMMUNITY): Payer: 59

## 2021-02-27 VITALS — BP 116/61 | HR 64 | Temp 96.9°F | Resp 18

## 2021-02-27 DIAGNOSIS — D508 Other iron deficiency anemias: Secondary | ICD-10-CM

## 2021-02-27 DIAGNOSIS — E538 Deficiency of other specified B group vitamins: Secondary | ICD-10-CM

## 2021-02-27 MED ORDER — SODIUM CHLORIDE 0.9 % IV SOLN: Freq: Once | INTRAVENOUS | Status: AC

## 2021-02-27 MED ORDER — SODIUM CHLORIDE 0.9% FLUSH: 10.0000 mL | Freq: Once | INTRAVENOUS | Status: DC | PRN

## 2021-02-27 MED ORDER — SODIUM CHLORIDE 0.9 % IV SOLN
200.0000 mg | Freq: Once | INTRAVENOUS | Status: AC
  Administered 2021-02-27: 200 mg via INTRAVENOUS
  Filled 2021-02-27: qty 200

## 2021-02-27 NOTE — Patient Instructions (Signed)
[facility] at [facility]  Discharge Instructions:  You received your iron infusion today, return as scheduled. _______________________________________________________________  Thank you for choosing [facility] at [facility] to provide your oncology and hematology care.  To afford each patient quality time with our providers, please arrive at least 15 minutes before your scheduled appointment.  You need to re-schedule your appointment if you arrive 10 or more minutes late.  We strive to give you quality time with our providers, and arriving late affects you and other patients whose appointments are after yours.  Also, if you no show three or more times for appointments you may be dismissed from the clinic.  Again, thank you for choosing [facility] at [facility]. Our hope is that these requests will allow you access to exceptional care and in a timely manner. _______________________________________________________________  If you have questions after your visit, please contact our office at [phone] between the hours of 8:30 a.m. and 5:00 p.m. Voicemails left after 4:30 p.m. will not be returned until the following business day. _______________________________________________________________  For prescription refill requests, have your pharmacy contact our office. _______________________________________________________________  Recommendations made by the consultant and any test results will be sent to your referring physician. _______________________________________________________________ 

## 2021-02-27 NOTE — Progress Notes (Signed)
Patient tolerated iron infusion with no complaints voiced.  Peripheral IV site clean and dry with good blood return noted before and after infusion.  Band aid applied.  VSS with discharge and left in satisfactory condition with no s/s of distress noted.   

## 2021-03-01 ENCOUNTER — Encounter (HOSPITAL_COMMUNITY): Payer: Self-pay

## 2021-03-01 ENCOUNTER — Inpatient Hospital Stay (HOSPITAL_COMMUNITY): Payer: 59

## 2021-03-01 ENCOUNTER — Other Ambulatory Visit: Payer: Self-pay

## 2021-03-01 VITALS — BP 145/75 | HR 60 | Temp 96.9°F | Resp 18

## 2021-03-01 DIAGNOSIS — D508 Other iron deficiency anemias: Secondary | ICD-10-CM

## 2021-03-01 DIAGNOSIS — E538 Deficiency of other specified B group vitamins: Secondary | ICD-10-CM

## 2021-03-01 MED ORDER — SODIUM CHLORIDE 0.9 % IV SOLN
200.0000 mg | Freq: Once | INTRAVENOUS | Status: AC
  Administered 2021-03-01: 200 mg via INTRAVENOUS
  Filled 2021-03-01: qty 200

## 2021-03-01 MED ORDER — SODIUM CHLORIDE 0.9 % IV SOLN
Freq: Once | INTRAVENOUS | Status: AC
Start: 2021-03-01 — End: 2021-03-01

## 2021-03-01 NOTE — Patient Instructions (Signed)
[facility] at [facility]  Discharge Instructions:  Venofer _______________________________________________________________  Thank you for choosing [facility] at [facility] to provide your oncology and hematology care.  To afford each patient quality time with our providers, please arrive at least 15 minutes before your scheduled appointment.  You need to re-schedule your appointment if you arrive 10 or more minutes late.  We strive to give you quality time with our providers, and arriving late affects you and other patients whose appointments are after yours.  Also, if you no show three or more times for appointments you may be dismissed from the clinic.  Again, thank you for choosing [facility] at [facility]. Our hope is that these requests will allow you access to exceptional care and in a timely manner. _______________________________________________________________  If you have questions after your visit, please contact our office at [phone] between the hours of 8:30 a.m. and 5:00 p.m. Voicemails left after 4:30 p.m. will not be returned until the following business day. _______________________________________________________________  For prescription refill requests, have your pharmacy contact our office. _______________________________________________________________  Recommendations made by the consultant and any test results will be sent to your referring physician. _______________________________________________________________ 

## 2021-03-01 NOTE — Progress Notes (Signed)
Patient presents today for Venofer infusion. Vital signs stable. Patient has no complaints of any side effects related to Venofer. MAR reviewed and updated.   Venofer given today per MD orders. Tolerated infusion without adverse affects. Vital signs stable. No complaints at this time. Discharged from clinic ambulatory in stable condition. Alert and oriented x 3. F/U with [facility] as scheduled.

## 2021-03-06 ENCOUNTER — Other Ambulatory Visit: Payer: Self-pay

## 2021-03-06 ENCOUNTER — Inpatient Hospital Stay (HOSPITAL_COMMUNITY): Payer: 59

## 2021-03-06 VITALS — BP 145/77 | HR 77 | Temp 97.4°F | Resp 18

## 2021-03-06 DIAGNOSIS — D508 Other iron deficiency anemias: Secondary | ICD-10-CM

## 2021-03-06 DIAGNOSIS — E538 Deficiency of other specified B group vitamins: Secondary | ICD-10-CM

## 2021-03-06 MED ORDER — SODIUM CHLORIDE 0.9 % IV SOLN
200.0000 mg | Freq: Once | INTRAVENOUS | Status: AC
  Administered 2021-03-06: 200 mg via INTRAVENOUS
  Filled 2021-03-06: qty 200

## 2021-03-06 MED ORDER — SODIUM CHLORIDE 0.9 % IV SOLN: Freq: Once | INTRAVENOUS | Status: AC

## 2021-03-06 NOTE — Progress Notes (Signed)
Tolerated iron infusion well today without incidence.  Stable during and after treatment.  Vital signs stable prior to discharge.  Discharged in stable condition ambulatory.

## 2021-03-06 NOTE — Patient Instructions (Signed)
[facility]  Discharge Instructions: Thank you for choosing [facility] to provide your oncology and hematology care.  If you have a lab appointment with the Cancer Center, please come in thru the Main Entrance and check in at the main information desk.   You received venofer infusion today.  Follow up as scheduled.  Please call the clinic if you have any questions or concerns.  Wear comfortable clothing and clothing appropriate for easy access to any Portacath or PICC line.   We strive to give you quality time with your provider. You may need to reschedule your appointment if you arrive late (15 or more minutes).  Arriving late affects you and other patients whose appointments are after yours.  Also, if you miss three or more appointments without notifying the office, you may be dismissed from the clinic at the provider's discretion.      For prescription refill requests, have your pharmacy contact our office and allow 72 hours for refills to be completed.     To help prevent nausea and vomiting after your treatment, we encourage you to take your nausea medication as directed.  BELOW ARE SYMPTOMS THAT SHOULD BE REPORTED IMMEDIATELY: . *FEVER GREATER THAN 100.4 F (38 C) OR HIGHER . *CHILLS OR SWEATING . *NAUSEA AND VOMITING THAT IS NOT CONTROLLED WITH YOUR NAUSEA MEDICATION . *UNUSUAL SHORTNESS OF BREATH . *UNUSUAL BRUISING OR BLEEDING . *URINARY PROBLEMS (pain or burning when urinating, or frequent urination) . *BOWEL PROBLEMS (unusual diarrhea, constipation, pain near the anus) . TENDERNESS IN MOUTH AND THROAT WITH OR WITHOUT PRESENCE OF ULCERS (sore throat, sores in mouth, or a toothache) . UNUSUAL RASH, SWELLING OR PAIN  . UNUSUAL VAGINAL DISCHARGE OR ITCHING   Items with * indicate a potential emergency and should be followed up as soon as possible or go to the Emergency Department if any problems should occur.  Please show the CHEMOTHERAPY ALERT CARD or  IMMUNOTHERAPY ALERT CARD at check-in to the Emergency Department and triage nurse.  Should you have questions after your visit or need to cancel or reschedule your appointment, please contact [facility] [phone]  and follow the prompts.  Office hours are 8:00 a.m. to 4:30 p.m. Monday - Friday. Please note that voicemails left after 4:00 p.m. may not be returned until the following business day.  We are closed weekends and major holidays. You have access to a nurse at all times for urgent questions. Please call the main number to the clinic [phone] and follow the prompts.  For any non-urgent questions, you may also contact your provider using MyChart. We now offer e-Visits for anyone 47 and older to request care online for non-urgent symptoms. For details visit [email or web address].   Also download the MyChart app! Go to the app store, search "MyChart", open the app, select [place], and log in with your MyChart username and password.  Due to Covid, a mask is required upon entering the hospital/clinic. If you do not have a mask, one will be given to you upon arrival. For doctor visits, patients may have 1 support person aged 14 or older with them. For treatment visits, patients cannot have anyone with them due to current Covid guidelines and our immunocompromised population.

## 2021-03-08 ENCOUNTER — Other Ambulatory Visit: Payer: Self-pay

## 2021-03-08 ENCOUNTER — Inpatient Hospital Stay (HOSPITAL_COMMUNITY): Payer: 59

## 2021-03-08 VITALS — BP 166/61 | HR 56 | Temp 97.1°F | Resp 18

## 2021-03-08 DIAGNOSIS — D508 Other iron deficiency anemias: Secondary | ICD-10-CM | POA: Diagnosis not present

## 2021-03-08 DIAGNOSIS — E538 Deficiency of other specified B group vitamins: Secondary | ICD-10-CM

## 2021-03-08 MED ORDER — SODIUM CHLORIDE 0.9 % IV SOLN
200.0000 mg | Freq: Once | INTRAVENOUS | Status: AC
  Administered 2021-03-08: 200 mg via INTRAVENOUS
  Filled 2021-03-08: qty 200

## 2021-03-08 MED ORDER — SODIUM CHLORIDE 0.9 % IV SOLN: Freq: Once | INTRAVENOUS | Status: AC

## 2021-03-08 NOTE — Progress Notes (Signed)
Patient presents today for Venofer infusion.  Vital signs within parameters for treatment.  No new complaints since last visit.  Peripheral IV started and blood return noted pre and post infusion.  Venofer infusion given today per MD orders.  Stable during infusion without adverse affects.  Vital signs stable.  No complaints at this time.  Discharge from clinic ambulatory in stable condition.  Alert and oriented X 3.  Follow up with [facility] as scheduled.

## 2021-03-08 NOTE — Patient Instructions (Signed)
[facility]  Discharge Instructions: Thank you for choosing [facility] to provide your oncology and hematology care.  If you have a lab appointment with the Cancer Center, please come in thru the Main Entrance and check in at the main information desk.  Wear comfortable clothing and clothing appropriate for easy access to any Portacath or PICC line.   We strive to give you quality time with your provider. You may need to reschedule your appointment if you arrive late (15 or more minutes).  Arriving late affects you and other patients whose appointments are after yours.  Also, if you miss three or more appointments without notifying the office, you may be dismissed from the clinic at the provider's discretion.      For prescription refill requests, have your pharmacy contact our office and allow 72 hours for refills to be completed.    Today you received the following: Venofer   To help prevent nausea and vomiting after your treatment, we encourage you to take your nausea medication as directed.  BELOW ARE SYMPTOMS THAT SHOULD BE REPORTED IMMEDIATELY: *FEVER GREATER THAN 100.4 F (38 C) OR HIGHER *CHILLS OR SWEATING *NAUSEA AND VOMITING THAT IS NOT CONTROLLED WITH YOUR NAUSEA MEDICATION *UNUSUAL SHORTNESS OF BREATH *UNUSUAL BRUISING OR BLEEDING *URINARY PROBLEMS (pain or burning when urinating, or frequent urination) *BOWEL PROBLEMS (unusual diarrhea, constipation, pain near the anus) TENDERNESS IN MOUTH AND THROAT WITH OR WITHOUT PRESENCE OF ULCERS (sore throat, sores in mouth, or a toothache) UNUSUAL RASH, SWELLING OR PAIN  UNUSUAL VAGINAL DISCHARGE OR ITCHING   Items with * indicate a potential emergency and should be followed up as soon as possible or go to the Emergency Department if any problems should occur.  Please show the CHEMOTHERAPY ALERT CARD or IMMUNOTHERAPY ALERT CARD at check-in to the Emergency Department and triage nurse.  Should you have  questions after your visit or need to cancel or reschedule your appointment, please contact [facility] [phone]  and follow the prompts.  Office hours are 8:00 a.m. to 4:30 p.m. Monday - Friday. Please note that voicemails left after 4:00 p.m. may not be returned until the following business day.  We are closed weekends and major holidays. You have access to a nurse at all times for urgent questions. Please call the main number to the clinic [phone] and follow the prompts.  For any non-urgent questions, you may also contact your provider using MyChart. We now offer e-Visits for anyone 18 and older to request care online for non-urgent symptoms. For details visit [email or web address].   Also download the MyChart app! Go to the app store, search "MyChart", open the app, select [place], and log in with your MyChart username and password.  Due to Covid, a mask is required upon entering the hospital/clinic. If you do not have a mask, one will be given to you upon arrival. For doctor visits, patients may have 1 support person aged 18 or older with them. For treatment visits, patients cannot have anyone with them due to current Covid guidelines and our immunocompromised population.  

## 2021-04-20 ENCOUNTER — Other Ambulatory Visit (HOSPITAL_COMMUNITY): Payer: Self-pay | Admitting: Physician Assistant

## 2021-04-20 DIAGNOSIS — D508 Other iron deficiency anemias: Secondary | ICD-10-CM

## 2021-05-22 ENCOUNTER — Inpatient Hospital Stay (HOSPITAL_COMMUNITY): Payer: 59 | Attending: Hematology

## 2021-05-29 ENCOUNTER — Inpatient Hospital Stay (HOSPITAL_COMMUNITY): Payer: 59 | Admitting: Physician Assistant

## 2023-08-23 ENCOUNTER — Other Ambulatory Visit: Payer: Self-pay

## 2023-08-23 ENCOUNTER — Emergency Department (HOSPITAL_COMMUNITY): Payer: Medicaid Other

## 2023-08-23 ENCOUNTER — Encounter (HOSPITAL_COMMUNITY): Payer: Self-pay | Admitting: Hematology and Oncology

## 2023-08-23 ENCOUNTER — Encounter (HOSPITAL_COMMUNITY): Payer: Self-pay | Admitting: *Deleted

## 2023-08-23 ENCOUNTER — Inpatient Hospital Stay (HOSPITAL_COMMUNITY)
Admission: EM | Admit: 2023-08-23 | Discharge: 2023-08-26 | DRG: 309 | Disposition: A | Discharge status: Home / Self Care | Payer: Medicaid Other | Attending: Family Medicine | Admitting: Family Medicine

## 2023-08-23 DIAGNOSIS — L97329 Non-pressure chronic ulcer of left ankle with unspecified severity: Secondary | ICD-10-CM | POA: Diagnosis present

## 2023-08-23 DIAGNOSIS — I4892 Unspecified atrial flutter: Principal | ICD-10-CM | POA: Diagnosis present

## 2023-08-23 DIAGNOSIS — Z7985 Long-term (current) use of injectable non-insulin antidiabetic drugs: Secondary | ICD-10-CM

## 2023-08-23 DIAGNOSIS — I10 Essential (primary) hypertension: Secondary | ICD-10-CM | POA: Diagnosis present

## 2023-08-23 DIAGNOSIS — E876 Hypokalemia: Secondary | ICD-10-CM | POA: Diagnosis present

## 2023-08-23 DIAGNOSIS — Z79899 Other long term (current) drug therapy: Secondary | ICD-10-CM

## 2023-08-23 DIAGNOSIS — Z9884 Bariatric surgery status: Secondary | ICD-10-CM

## 2023-08-23 DIAGNOSIS — Z7984 Long term (current) use of oral hypoglycemic drugs: Secondary | ICD-10-CM | POA: Diagnosis not present

## 2023-08-23 DIAGNOSIS — Z888 Allergy status to other drugs, medicaments and biological substances status: Secondary | ICD-10-CM

## 2023-08-23 DIAGNOSIS — D649 Anemia, unspecified: Secondary | ICD-10-CM | POA: Diagnosis present

## 2023-08-23 DIAGNOSIS — Z794 Long term (current) use of insulin: Secondary | ICD-10-CM | POA: Diagnosis not present

## 2023-08-23 DIAGNOSIS — E119 Type 2 diabetes mellitus without complications: Secondary | ICD-10-CM | POA: Diagnosis present

## 2023-08-23 DIAGNOSIS — Z833 Family history of diabetes mellitus: Secondary | ICD-10-CM | POA: Diagnosis not present

## 2023-08-23 DIAGNOSIS — Z8249 Family history of ischemic heart disease and other diseases of the circulatory system: Secondary | ICD-10-CM | POA: Diagnosis not present

## 2023-08-23 DIAGNOSIS — I4891 Unspecified atrial fibrillation: Secondary | ICD-10-CM | POA: Diagnosis not present

## 2023-08-23 DIAGNOSIS — D509 Iron deficiency anemia, unspecified: Secondary | ICD-10-CM | POA: Diagnosis present

## 2023-08-23 DIAGNOSIS — E538 Deficiency of other specified B group vitamins: Secondary | ICD-10-CM | POA: Diagnosis present

## 2023-08-23 LAB — GLUCOSE, CAPILLARY
Glucose-Capillary: 131 mg/dL — ABNORMAL HIGH (ref 70–99)
Glucose-Capillary: 255 mg/dL — ABNORMAL HIGH (ref 70–99)

## 2023-08-23 LAB — COMPREHENSIVE METABOLIC PANEL
ALT: 17 U/L (ref 0–44)
AST: 18 U/L (ref 15–41)
Albumin: 4.1 g/dL (ref 3.5–5.0)
Alkaline Phosphatase: 85 U/L (ref 38–126)
Anion gap: 9 (ref 5–15)
BUN: 13 mg/dL (ref 6–20)
CO2: 20 mmol/L — ABNORMAL LOW (ref 22–32)
Calcium: 8.6 mg/dL — ABNORMAL LOW (ref 8.9–10.3)
Chloride: 106 mmol/L (ref 98–111)
Creatinine, Ser: 0.65 mg/dL (ref 0.44–1.00)
GFR, Estimated: >60 mL/min (ref >60)
Glucose, Bld: 105 mg/dL — ABNORMAL HIGH (ref 70–99)
Potassium: 3.3 mmol/L — ABNORMAL LOW (ref 3.5–5.1)
Sodium: 135 mmol/L (ref 135–145)
Total Bilirubin: 0.4 mg/dL (ref 0.3–1.2)
Total Protein: 7.5 g/dL (ref 6.5–8.1)

## 2023-08-23 LAB — CBC WITH DIFFERENTIAL/PLATELET
Abs Immature Granulocytes: 0.04 10*3/uL (ref 0.00–0.07)
Basophils Absolute: 0.1 10*3/uL (ref 0.0–0.1)
Basophils Relative: 1 %
Eosinophils Absolute: 0.2 10*3/uL (ref 0.0–0.5)
Eosinophils Relative: 2 %
HCT: 32.7 % — ABNORMAL LOW (ref 36.0–46.0)
Hemoglobin: 9.5 g/dL — ABNORMAL LOW (ref 12.0–15.0)
Immature Granulocytes: 0 %
Lymphocytes Relative: 19 %
Lymphs Abs: 2 10*3/uL (ref 0.7–4.0)
MCH: 20.9 pg — ABNORMAL LOW (ref 26.0–34.0)
MCHC: 29.1 g/dL — ABNORMAL LOW (ref 30.0–36.0)
MCV: 72 fL — ABNORMAL LOW (ref 80.0–100.0)
Monocytes Absolute: 0.5 10*3/uL (ref 0.1–1.0)
Monocytes Relative: 5 %
Neutro Abs: 7.3 10*3/uL (ref 1.7–7.7)
Neutrophils Relative %: 73 %
Platelets: 325 10*3/uL (ref 150–400)
RBC: 4.54 MIL/uL (ref 3.87–5.11)
RDW: 17.2 % — ABNORMAL HIGH (ref 11.5–15.5)
WBC: 10.1 10*3/uL (ref 4.0–10.5)
nRBC: 0 % (ref 0.0–0.2)

## 2023-08-23 LAB — HIV ANTIBODY (ROUTINE TESTING W REFLEX): HIV Screen 4th Generation wRfx: NONREACTIVE

## 2023-08-23 LAB — HEMOGLOBIN A1C
Hgb A1c MFr Bld: 7.4 % — ABNORMAL HIGH (ref 4.8–5.6)
Mean Plasma Glucose: 165.68 mg/dL

## 2023-08-23 LAB — TROPONIN I (HIGH SENSITIVITY)
Troponin I (High Sensitivity): 10 ng/L (ref <18)
Troponin I (High Sensitivity): 14 ng/L (ref <18)

## 2023-08-23 LAB — MRSA NEXT GEN BY PCR, NASAL: MRSA by PCR Next Gen: NOT DETECTED

## 2023-08-23 LAB — MAGNESIUM: Magnesium: 2.3 mg/dL (ref 1.7–2.4)

## 2023-08-23 LAB — PHOSPHORUS: Phosphorus: 2.7 mg/dL (ref 2.5–4.6)

## 2023-08-23 LAB — T4, FREE: Free T4: 0.94 ng/dL (ref 0.61–1.12)

## 2023-08-23 LAB — TSH: TSH: 0.991 u[IU]/mL (ref 0.350–4.500)

## 2023-08-23 LAB — HEPARIN LEVEL (UNFRACTIONATED): Heparin Unfractionated: 0.35 [IU]/mL (ref 0.30–0.70)

## 2023-08-23 MED ORDER — HYDRALAZINE HCL 20 MG/ML IJ SOLN
10.0000 mg | INTRAMUSCULAR | Status: DC | PRN
  Administered 2023-08-24 (×2): 10 mg via INTRAVENOUS
  Filled 2023-08-23 (×2): qty 1

## 2023-08-23 MED ORDER — HEPARIN BOLUS VIA INFUSION
4500.0000 [IU] | Freq: Once | INTRAVENOUS | Status: AC
  Administered 2023-08-23: 4500 [IU] via INTRAVENOUS

## 2023-08-23 MED ORDER — SODIUM CHLORIDE 0.9% FLUSH
3.0000 mL | Freq: Two times a day (BID) | INTRAVENOUS | Status: DC
  Administered 2023-08-23 – 2023-08-26 (×5): 3 mL via INTRAVENOUS

## 2023-08-23 MED ORDER — SODIUM CHLORIDE 0.9% FLUSH
3.0000 mL | Freq: Two times a day (BID) | INTRAVENOUS | Status: DC
  Administered 2023-08-23 – 2023-08-24 (×2): 3 mL via INTRAVENOUS

## 2023-08-23 MED ORDER — METOPROLOL TARTRATE 5 MG/5ML IV SOLN
5.0000 mg | Freq: Once | INTRAVENOUS | Status: AC
  Administered 2023-08-23: 5 mg via INTRAVENOUS
  Filled 2023-08-23: qty 5

## 2023-08-23 MED ORDER — HEPARIN (PORCINE) 25000 UT/250ML-% IV SOLN
1450.0000 [IU]/h | INTRAVENOUS | Status: DC
  Administered 2023-08-23: 1300 [IU]/h via INTRAVENOUS
  Filled 2023-08-23 (×2): qty 250

## 2023-08-23 MED ORDER — DILTIAZEM HCL 25 MG/5ML IV SOLN
15.0000 mg | Freq: Once | INTRAVENOUS | Status: AC
  Administered 2023-08-23: 15 mg via INTRAVENOUS
  Filled 2023-08-23: qty 5

## 2023-08-23 MED ORDER — HEPARIN SODIUM (PORCINE) 5000 UNIT/ML IJ SOLN: 5000.0000 [IU] | Freq: Three times a day (TID) | INTRAMUSCULAR | Status: DC

## 2023-08-23 MED ORDER — BISACODYL 5 MG PO TBEC: 5.0000 mg | DELAYED_RELEASE_TABLET | Freq: Every day | ORAL | Status: DC | PRN

## 2023-08-23 MED ORDER — AMIODARONE HCL IN DEXTROSE 360-4.14 MG/200ML-% IV SOLN
60.0000 mg/h | INTRAVENOUS | Status: AC
  Administered 2023-08-23 (×2): 60 mg/h via INTRAVENOUS
  Filled 2023-08-23 (×2): qty 200

## 2023-08-23 MED ORDER — ACETAMINOPHEN 325 MG PO TABS
650.0000 mg | ORAL_TABLET | Freq: Four times a day (QID) | ORAL | Status: DC | PRN
  Administered 2023-08-23: 650 mg via ORAL
  Filled 2023-08-23: qty 2

## 2023-08-23 MED ORDER — ACETAMINOPHEN 650 MG RE SUPP: 650.0000 mg | Freq: Four times a day (QID) | RECTAL | Status: DC | PRN

## 2023-08-23 MED ORDER — POTASSIUM CHLORIDE CRYS ER 20 MEQ PO TBCR
40.0000 meq | EXTENDED_RELEASE_TABLET | Freq: Once | ORAL | Status: AC
  Administered 2023-08-23: 40 meq via ORAL
  Filled 2023-08-23: qty 2

## 2023-08-23 MED ORDER — INSULIN ASPART 100 UNIT/ML IJ SOLN
0.0000 [IU] | Freq: Three times a day (TID) | INTRAMUSCULAR | Status: DC
  Administered 2023-08-24: 1 [IU] via SUBCUTANEOUS
  Administered 2023-08-25: 2 [IU] via SUBCUTANEOUS

## 2023-08-23 MED ORDER — ONDANSETRON HCL 4 MG PO TABS: 4.0000 mg | ORAL_TABLET | Freq: Four times a day (QID) | ORAL | Status: DC | PRN

## 2023-08-23 MED ORDER — HYDROMORPHONE HCL 1 MG/ML IJ SOLN: 0.5000 mg | INTRAMUSCULAR | Status: DC | PRN

## 2023-08-23 MED ORDER — AMIODARONE LOAD VIA INFUSION
150.0000 mg | Freq: Once | INTRAVENOUS | Status: AC
  Administered 2023-08-23: 150 mg via INTRAVENOUS
  Filled 2023-08-23: qty 83.34

## 2023-08-23 MED ORDER — SODIUM CHLORIDE 0.9 % IV SOLN: 250.0000 mL | INTRAVENOUS | Status: DC | PRN

## 2023-08-23 MED ORDER — SENNOSIDES-DOCUSATE SODIUM 8.6-50 MG PO TABS: 1.0000 | ORAL_TABLET | Freq: Every evening | ORAL | Status: DC | PRN

## 2023-08-23 MED ORDER — SODIUM CHLORIDE 0.9 % IV BOLUS
1000.0000 mL | Freq: Once | INTRAVENOUS | Status: AC
  Administered 2023-08-23: 1000 mL via INTRAVENOUS

## 2023-08-23 MED ORDER — ATORVASTATIN CALCIUM 20 MG PO TABS
20.0000 mg | ORAL_TABLET | Freq: Every day | ORAL | Status: DC
  Administered 2023-08-23 – 2023-08-26 (×4): 20 mg via ORAL
  Filled 2023-08-23 (×4): qty 1

## 2023-08-23 MED ORDER — DILTIAZEM HCL-DEXTROSE 125-5 MG/125ML-% IV SOLN (PREMIX)
5.0000 mg/h | INTRAVENOUS | Status: DC
  Administered 2023-08-23: 5 mg/h via INTRAVENOUS
  Filled 2023-08-23 (×2): qty 125

## 2023-08-23 MED ORDER — OXYCODONE HCL 5 MG PO TABS: 5.0000 mg | ORAL_TABLET | ORAL | Status: DC | PRN

## 2023-08-23 MED ORDER — MAGNESIUM SULFATE 2 GM/50ML IV SOLN
2.0000 g | Freq: Once | INTRAVENOUS | Status: AC
  Administered 2023-08-23: 2 g via INTRAVENOUS
  Filled 2023-08-23: qty 50

## 2023-08-23 MED ORDER — AMIODARONE HCL IN DEXTROSE 360-4.14 MG/200ML-% IV SOLN
30.0000 mg/h | INTRAVENOUS | Status: DC
  Administered 2023-08-23: 30 mg/h via INTRAVENOUS
  Filled 2023-08-23: qty 200

## 2023-08-23 MED ORDER — TRAZODONE HCL 50 MG PO TABS
25.0000 mg | ORAL_TABLET | Freq: Every evening | ORAL | Status: DC | PRN
  Filled 2023-08-23: qty 1

## 2023-08-23 MED ORDER — CEPHALEXIN 500 MG PO CAPS
500.0000 mg | ORAL_CAPSULE | Freq: Four times a day (QID) | ORAL | Status: AC
  Administered 2023-08-23 – 2023-08-25 (×9): 500 mg via ORAL
  Filled 2023-08-23 (×4): qty 1
  Filled 2023-08-23 (×5): qty 2

## 2023-08-23 MED ORDER — ONDANSETRON HCL 4 MG/2ML IJ SOLN: 4.0000 mg | Freq: Four times a day (QID) | INTRAMUSCULAR | Status: DC | PRN

## 2023-08-23 MED ORDER — ASPIRIN 81 MG PO CHEW
324.0000 mg | CHEWABLE_TABLET | Freq: Once | ORAL | Status: AC
  Administered 2023-08-23: 324 mg via ORAL
  Filled 2023-08-23: qty 4

## 2023-08-23 MED ORDER — FLEET ENEMA RE ENEM: 1.0000 | ENEMA | Freq: Once | RECTAL | Status: DC | PRN

## 2023-08-23 MED ORDER — LISINOPRIL 10 MG PO TABS: 20.0000 mg | ORAL_TABLET | Freq: Every day | ORAL | Status: DC

## 2023-08-23 MED ORDER — LEVALBUTEROL HCL 0.63 MG/3ML IN NEBU: 0.6300 mg | INHALATION_SOLUTION | Freq: Four times a day (QID) | RESPIRATORY_TRACT | Status: DC | PRN

## 2023-08-23 MED ORDER — IPRATROPIUM BROMIDE 0.02 % IN SOLN: 0.5000 mg | Freq: Four times a day (QID) | RESPIRATORY_TRACT | Status: DC | PRN

## 2023-08-23 MED ORDER — CHLORHEXIDINE GLUCONATE CLOTH 2 % EX PADS
6.0000 | MEDICATED_PAD | Freq: Every day | CUTANEOUS | Status: DC
  Administered 2023-08-24 – 2023-08-26 (×2): 6 via TOPICAL

## 2023-08-23 NOTE — Assessment & Plan Note (Signed)
-   Monitoring CBC, stable

## 2023-08-23 NOTE — ED Triage Notes (Signed)
Pt states she woke up at 5am today with feelings of her heart racing with some chest pain and sob; pt states the pain radiates to her neck and into her jaw

## 2023-08-23 NOTE — Progress Notes (Signed)
PHARMACY - ANTICOAGULATION CONSULT NOTE  Pharmacy Consult for heparin Indication: atrial fibrillation  Allergies  Allergen Reactions   Empagliflozin Other (See Comments)    Stomach pain    Patient Measurements: Height: 5\' 10"  (177.8 cm) Weight: 100.1 kg (220 lb 10.9 oz) IBW/kg (Calculated) : 68.5 Heparin Dosing Weight: 89 kg  Vital Signs: Temp: 98.5 F (36.9 C) (10/11 1523) Temp Source: Oral (10/11 1523) BP: 142/97 (10/11 1600) Pulse Rate: 137 (10/11 1600)  Labs: Recent Labs    08/23/23 0730 08/23/23 0942 08/23/23 1445  HGB 9.5*  --   --   HCT 32.7*  --   --   PLT 325  --   --   HEPARINUNFRC  --   --  0.35  CREATININE 0.65  --   --   TROPONINIHS 10 14  --     Estimated Creatinine Clearance: 104.1 mL/min (by C-G formula based on SCr of 0.65 mg/dL).   Medical History: Past Medical History:  Diagnosis Date   Anemia    Blood transfusion without reported diagnosis 2007   after gastric bypass surgery   Diabetes mellitus without complication (HCC) 2003   Hypertension 2008    Medications:  Medications Prior to Admission  Medication Sig Dispense Refill Last Dose   atorvastatin (LIPITOR) 10 MG tablet Take 1 tablet by mouth daily.      atorvastatin (LIPITOR) 20 MG tablet Take 1 tablet by mouth daily.      cephALEXin (KEFLEX) 500 MG capsule Take 500 mg by mouth 4 (four) times daily.      Insulin NPH, Human,, Isophane, (NOVOLIN N FLEXPEN) 100 UNIT/ML Kiwkpen Inject 10 Units into the skin at bedtime.      lisinopril (PRINIVIL,ZESTRIL) 20 MG tablet Take 20 mg by mouth daily.      metFORMIN (GLUCOPHAGE) 1000 MG tablet Take 1,000 mg by mouth 2 (two) times daily.      OZEMPIC, 1 MG/DOSE, 4 MG/3ML SOPN Inject 1 mg into the skin once a week.      Sod Fluoride-Potassium Nitrate (PREVIDENT 5000 ENAMEL PROTECT) 1.1-5 % GEL        Assessment: Pharmacy consulted to dose heparin in patient with atrial fibrillation.  Patient is not on anticoagulation prior to admission.    HL  0.35- therapeutic Hgb 9.5  Goal of Therapy:  Heparin level 0.3-0.7 units/ml Monitor platelets by anticoagulation protocol: Yes   Plan:  Continue heparin infusion at 1300 units/hr Heparin level daily Continue to monitor H&H and platelets.  Judeth Cornfield, PharmD Clinical Pharmacist 08/23/2023 4:35 PM

## 2023-08-23 NOTE — Progress Notes (Signed)
   08/23/23 1440  TOC Brief Assessment  Insurance and Status Reviewed  Patient has primary care physician Yes  Home environment has been reviewed from home  Prior level of function: independent  Prior/Current Home Services No current home services  Social Determinants of Health Reivew SDOH reviewed no interventions necessary  Readmission risk has been reviewed Yes  Transition of care needs no transition of care needs at this time    Transition of Care Department [facility]) has reviewed patient and no TOC needs have been identified at this time. We will continue to monitor patient advancement through interdisciplinary progression rounds. If new patient transition needs arise, please place a TOC consult.

## 2023-08-23 NOTE — Assessment & Plan Note (Addendum)
Monitoring H&H, chronic, stable    Latest Ref Rng & Units 08/25/2023    5:02 AM 08/24/2023    5:29 AM 08/23/2023    7:30 AM  CBC  WBC 4.0 - 10.5 K/uL 9.4  9.1  10.1   Hemoglobin 12.0 - 15.0 g/dL 8.2  8.0  9.5   Hematocrit 36.0 - 46.0 % 28.4  28.7  32.7   Platelets 150 - 400 K/uL 270  257  325

## 2023-08-23 NOTE — Hospital Course (Addendum)
Norma Carroll is a 52 year old female with history of HTN, DM II, iron deficiency anemia, B12 deficiency.  Presented to ED with chief complaint of chest pain and palpitation.  She reported that this morning she woke up with the chest pain and noticed palpitation.  And had some shortness of breath.  She described the pain as substernal mid chest area, radiating to her jaw.  Felt that her heart was racing but this been going on for past 2 weeks which resolved spontaneously.  Episode of palpitation was 2 to 3 days ago and was not associated with chest pain. For she has no diagnosis of arrhythmia. Denies of having any recent illnesses no change in medication or diet.   ED valuation/course: Blood pressure (!) 139/98, pulse (!) 137, temperature (!) 97.4 F (36.3 C),  RR 17, SpO2 97%.  Hemoglobin 9.5, potassium 3.3, troponin 14, TSH 0.99, free T40.94, Chest x-ray-clear, EKG-reviewed  Upon arrival heart rate was 140s and flutter..  Did not respond to initial treatment with amiodarone bolus and Cardizem.  Cardiologist was consulted, patient started on amiodarone and Cardizem drip, Also heparin drip.   Requested patient to be admitted for rate control.

## 2023-08-23 NOTE — Assessment & Plan Note (Addendum)
-  Lisinopril was held on admission -PRN IV hydralazine -- Hypertensive: Starting:  lisinopril, metoprolol, Cardizem

## 2023-08-23 NOTE — Progress Notes (Signed)
PHARMACY - ANTICOAGULATION CONSULT NOTE  Pharmacy Consult for heparin Indication: atrial fibrillation  No Known Allergies  Patient Measurements: Height: 5\' 10"  (177.8 cm) Weight: 97.1 kg (214 lb) IBW/kg (Calculated) : 68.5 Heparin Dosing Weight: 89 kg  Vital Signs: Temp: 97.4 F (36.3 C) (10/11 0727) Temp Source: Oral (10/11 0727) BP: 162/121 (10/11 0800) Pulse Rate: 119 (10/11 0811)  Labs: Recent Labs    08/23/23 0730  HGB 9.5*  HCT 32.7*  PLT 325  CREATININE 0.65  TROPONINIHS 10    Estimated Creatinine Clearance: 102.6 mL/min (by C-G formula based on SCr of 0.65 mg/dL).   Medical History: Past Medical History:  Diagnosis Date   Anemia    Blood transfusion without reported diagnosis 2007   after gastric bypass surgery   Diabetes mellitus without complication (HCC) 2003   Hypertension 2008    Medications:  (Not in a hospital admission)   Assessment: Pharmacy consulted to dose heparin in patient with atrial fibrillation.  Patient is not on anticoagulation prior to admission.    Hgb 9.5  Goal of Therapy:  Heparin level 0.3-0.7 units/ml Monitor platelets by anticoagulation protocol: Yes   Plan:  Give 4500 units bolus x 1 Start heparin infusion at 1300 units/hr Check anti-Xa level in 6 hours and daily while on heparin Continue to monitor H&H and platelets  Judeth Cornfield, PharmD Clinical Pharmacist 08/23/2023 8:37 AM

## 2023-08-23 NOTE — Assessment & Plan Note (Signed)
-   Healing on antibiotics as an outpatient amoxicillin will be continued

## 2023-08-23 NOTE — Assessment & Plan Note (Addendum)
-   Home medication metformin will be held -Will check her CBG q. ACHS, SSI coverage -Diabetic/cardiac diet - A1c: 7.4

## 2023-08-23 NOTE — Plan of Care (Signed)
  Problem: Coping: Goal: Ability to adjust to condition or change in health will improve Outcome: Progressing   Problem: Education: Goal: Knowledge of General Education information will improve Description: Including pain rating scale, medication(s)/side effects and non-pharmacologic comfort measures Outcome: Progressing   Problem: Health Behavior/Discharge Planning: Goal: Ability to manage health-related needs will improve Outcome: Progressing   Problem: Clinical Measurements: Goal: Ability to maintain clinical measurements within normal limits will improve Outcome: Progressing   Problem: Activity: Goal: Risk for activity intolerance will decrease Outcome: Progressing   Problem: Pain Managment: Goal: General experience of comfort will improve Outcome: Progressing   Problem: Safety: Goal: Ability to remain free from injury will improve Outcome: Progressing

## 2023-08-23 NOTE — H&P (Signed)
History and Physical   Patient: Norma Carroll                            PCP: Erasmo Downer, NP                    DOB: February 05, 1971            DOA: 08/23/2023 ZOX:[id number]             DOS: 08/23/2023, 3:01 PM  Erasmo Downer, NP  Patient coming from:   HOME  I have personally reviewed patient's medical records, in electronic medical records, including:  [place] link, and care everywhere.    Chief Complaint:   Chief Complaint  Patient presents with   Palpitations    History of present illness:    Norma Carroll is a 52 year old female with history of HTN, DM II, iron deficiency anemia, B12 deficiency.  Presented to ED with chief complaint of chest pain and palpitation.  She reported that this morning she woke up with the chest pain and noticed palpitation.  And had some shortness of breath.  She described the pain as substernal mid chest area, radiating to her jaw.  Felt that her heart was racing but this been going on for past 2 weeks which resolved spontaneously.  Episode of palpitation was 2 to 3 days ago and was not associated with chest pain. For she has no diagnosis of arrhythmia. Denies of having any recent illnesses no change in medication or diet.   ED valuation/course: Blood pressure (!) 139/98, pulse (!) 137, temperature (!) 97.4 F (36.3 C),  RR 17, SpO2 97%.  Hemoglobin 9.5, potassium 3.3, troponin 14, TSH 0.99, free T40.94, Chest x-ray-clear, EKG-reviewed  Upon arrival heart rate was 140s and flutter..  Did not respond to initial treatment with amiodarone bolus and Cardizem.  Cardiologist was consulted, patient started on amiodarone and Cardizem drip, Also heparin drip.   Requested patient to be admitted for rate control.      Patient Denies having: Fever, Chills, Cough, SOB, Chest Pain, Abd pain, N/V/D, headache, dizziness, lightheadedness,  Dysuria, Joint pain, rash, open wounds    Review of Systems: As per HPI,  otherwise 10 point review of systems were negative.   ----------------------------------------------------------------------------------------------------------------------  Allergies  Allergen Reactions   Empagliflozin Other (See Comments)    Stomach pain    Home MEDs:  Prior to Admission medications   Medication Sig Start Date End Date Taking? Authorizing Provider  atorvastatin (LIPITOR) 10 MG tablet Take 1 tablet by mouth daily. 03/22/22  Yes [provider]  atorvastatin (LIPITOR) 20 MG tablet Take 1 tablet by mouth daily. 02/26/21   [provider]  cephALEXin (KEFLEX) 500 MG capsule Take 500 mg by mouth 4 (four) times daily.    [provider]  Insulin NPH, Human,, Isophane, (NOVOLIN N FLEXPEN) 100 UNIT/ML Kiwkpen Inject 10 Units into the skin at bedtime. 03/27/22   [provider]  lisinopril (PRINIVIL,ZESTRIL) 20 MG tablet Take 20 mg by mouth daily.    [provider]  metFORMIN (GLUCOPHAGE) 1000 MG tablet Take 1,000 mg by mouth 2 (two) times daily. 12/28/18   [provider]  OZEMPIC, 1 MG/DOSE, 4 MG/3ML SOPN Inject 1 mg into the skin once a week.    [provider]  Sod Fluoride-Potassium Nitrate (PREVIDENT 5000 ENAMEL PROTECT) 1.1-5 % GEL     [provider]  PRN MEDs: sodium chloride, acetaminophen **OR** acetaminophen, bisacodyl, hydrALAZINE, HYDROmorphone (DILAUDID) injection, ipratropium, levalbuterol, ondansetron **OR** ondansetron (ZOFRAN) IV, oxyCODONE, senna-docusate, sodium phosphate, traZODone  Past Medical History:  Diagnosis Date   Anemia    Blood transfusion without reported diagnosis 2007   after gastric bypass surgery   Diabetes mellitus without complication (HCC) 2003   Hypertension 2008    Past Surgical History:  Procedure Laterality Date   CESAREAN SECTION  19921 and 1997   LAPAROSCOPIC APPENDECTOMY N/A 04/01/2019   Procedure: APPENDECTOMY LAPAROSCOPIC;  Surgeon: Franky Macho, MD;   Location: [facility];  Service: General;  Laterality: N/A;   LAPAROSCOPIC GASTRIC BYPASS  2007     reports that she has never smoked. She has never used smokeless tobacco. She reports that she does not drink alcohol and does not use drugs.   Family History  Problem Relation Age of Onset   Heart attack Father    Hypertension Father    Diabetes Father     Physical Exam:   Vitals:   08/23/23 1415 08/23/23 1430 08/23/23 1445 08/23/23 1452  BP:  (!) 139/98    Pulse: (!) 138 (!) 137 (!) 137 (!) 137  Resp: 18 17 15 18   Temp:      TempSrc:      SpO2: 97% 97% 98% 98%  Weight:      Height:       Constitutional: NAD, calm, comfortable Eyes: PERRL, lids and conjunctivae normal ENMT: Mucous membranes are moist. Posterior pharynx clear of any exudate or lesions.Normal dentition.  Neck: normal, supple, no masses, no thyromegaly Respiratory: clear to auscultation bilaterally, no wheezing, no crackles. Normal respiratory effort. No accessory muscle use.  Cardiovascular: Regular rate and rhythm, no murmurs / rubs / gallops. No extremity edema. 2+ pedal pulses. No carotid bruits.  Abdomen: no tenderness, no masses palpated. No hepatosplenomegaly. Bowel sounds positive.  Musculoskeletal: no clubbing / cyanosis. No joint deformity upper and lower extremities. Good ROM, no contractures. Normal muscle tone.  Neurologic: CN II-XII grossly intact. Sensation intact, DTR normal. Strength 5/5 in all 4.  Psychiatric: Normal judgment and insight. Alert and oriented x 3. Normal mood.  Skin: no rashes, lesions, ulcers. No induration Decubitus/ulcers:  Wounds: per nursing documentation         Labs on admission:    I have personally reviewed following labs and imaging studies  CBC: Recent Labs  Lab 08/23/23 0730  WBC 10.1  NEUTROABS 7.3  HGB 9.5*  HCT 32.7*  MCV 72.0*  PLT 325   Basic Metabolic Panel: Recent Labs  Lab 08/23/23 0730  NA 135  K 3.3*  CL 106  CO2 20*  GLUCOSE 105*  BUN  13  CREATININE 0.65  CALCIUM 8.6*   GFR: Estimated Creatinine Clearance: 102.6 mL/min (by C-G formula based on SCr of 0.65 mg/dL). Liver Function Tests: Recent Labs  Lab 08/23/23 0730  AST 18  ALT 17  ALKPHOS 85  BILITOT 0.4  PROT 7.5  ALBUMIN 4.1    Thyroid Function Tests: Recent Labs    08/23/23 0730  TSH 0.991  FREET4 0.94   Anemia Panel: No results for input(s): "VITAMINB12", "FOLATE", "FERRITIN", "TIBC", "IRON", "RETICCTPCT" in the last 72 hours. Urine analysis:    Component Value Date/Time   COLORURINE AMBER (A) 03/31/2019 2330   APPEARANCEUR CLEAR 03/31/2019 2330   LABSPEC 1.014 03/31/2019 2330   PHURINE 6.0 03/31/2019 2330   GLUCOSEU 150 (A) 03/31/2019 2330   HGBUR NEGATIVE 03/31/2019 2330   BILIRUBINUR  NEGATIVE 03/31/2019 2330   KETONESUR NEGATIVE 03/31/2019 2330   PROTEINUR NEGATIVE 03/31/2019 2330   NITRITE POSITIVE (A) 03/31/2019 2330   LEUKOCYTESUR NEGATIVE 03/31/2019 2330    Last A1C:  No results found for: "HGBA1C"   Radiologic Exams on Admission:   DG Chest Port 1 View  Result Date: 08/23/2023 CLINICAL DATA:  Palpitations, chest pain, and shortness of breath EXAM: PORTABLE CHEST 1 VIEW COMPARISON:  Chest radiograph dated 04/01/2019 FINDINGS: Normal lung volumes. No focal consolidations. No pleural effusion or pneumothorax. Mildly enlarged cardiomediastinal silhouette. No acute osseous abnormality. IMPRESSION: 1.  No focal consolidations. 2. Mildly enlarged cardiomediastinal silhouette. Electronically Signed   By: Agustin Cree M.D.   On: 08/23/2023 09:27    EKG:   Independently reviewed.  Orders placed or performed during the hospital encounter of 08/23/23   EKG 12-Lead   EKG 12-Lead   ED EKG   ED EKG   EKG 12-Lead   EKG 12-Lead   EKG 12-Lead   ---------------------------------------------------------------------------------------------------------------------------------------    Assessment / Plan:   Principal Problem:   Atrial  flutter with rapid ventricular response (HCC) Active Problems:   HTN (hypertension)   Ankle ulcer, left, with unspecified severity (HCC)   Type 2 diabetes mellitus (HCC)   Chronic anemia   Iron deficiency anemia   Atrial flutter (HCC)   Assessment and Plan: * Atrial flutter with rapid ventricular response (HCC) - Will be admitted to ICU -Will continue amiodarone and Cardizem drip per cardiology -Monitoring and titrating medication for rate control -Continuing heparin drip per cardiology  -CHA2DS2-VASc 2 score 3 -Patient needs chronic anticoagulation, rate control medication -2D echocardiogram >>  -Recycling cardiac enzymes: Troponin 10, 14, -Cardiology consulted, further treatment per cardiology including plan for future cardioversion    Ankle ulcer, left, with unspecified severity (HCC) - Healing on antibiotics as an outpatient amoxicillin will be continued  HTN (hypertension) - Currently stable, will hold home medication lisinopril as patient is now on Cardizem drip -On IV hydralazine -Trend blood pressure on Cardizem drip  Iron deficiency anemia - Monitoring CBC, stable  Chronic anemia Monitoring H&H, chronic, stable    Latest Ref Rng & Units 08/23/2023    7:30 AM 02/20/2021    9:24 AM 02/17/2021    9:30 AM  CBC  WBC 4.0 - 10.5 K/uL 10.1  8.8  8.8   Hemoglobin 12.0 - 15.0 g/dL 9.5  7.2  7.1   Hematocrit 36.0 - 46.0 % 32.7  27.9  27.4   Platelets 150 - 400 K/uL 325  304  290      Type 2 diabetes mellitus (HCC) - Home medication metformin will be held -Will check her CBG q. ACHS, SSI coverage -Diabetic/cardiac diet -Checking A1c      Consults called: Cardiologist -------------------------------------------------------------------------------------------------------------------------------------------- DVT prophylaxis:  TED hose Start: 08/23/23 1420 SCDs Start: 08/23/23 1420   Code Status:   Code Status: Full Code   Admission status: Patient will  be admitted as Inpatient, with a greater than 2 midnight length of stay. Level of care: ICU   Family Communication:  none at bedside  (The above findings and plan of care has been discussed with patient in detail, the patient expressed understanding and agreement of above plan)  -------------------------------------------------------------------------------------------------------------------------  Disposition Plan:  Anticipated 1-2 days Status is: Inpatient Remains inpatient appropriate because: Needing ICU admission, IV rate control medications, IV heparin  -------------------------------------------------------------------------------------------------------------------------  Critical care time spent:  62  Min.  Was spent seeing and evaluating the patient, reviewing all  medical records, drawn plan of care.  SIGNED: Kendell Bane, MD, FHM. FAAFP. [place] [facility], Pager  (Please use [email or web address] to page/ or secure chat through epic) If 7PM-7AM, please contact night-coverage [email or web address],  08/23/2023, 3:01 PM

## 2023-08-23 NOTE — ED Provider Notes (Signed)
[facility] Provider Note  CSN: [id number] Arrival date & time: 08/23/23 6387  Chief Complaint(s) Palpitations  HPI Rexine Nykeria Mealing is a 52 y.o. female with past medical history as below, significant for hypertension, diabetes, IDA, B12 deficiency who presents to the ED with complaint of chest pain palpitations  Patient reports she woke up around 5 AM with chest pain, palpitations, dyspnea.  Pain from the chest up to her jaw.  Felt like her heart was racing.  Felt okay went to bed last night.  Symptoms continued and presented to the ER this morning.  Reports she has felt similar episodes twice over the past week but have resolved spontaneously.  Last episode was 2 to 3 days ago.  No formal diagnosis of A-fib or a flutter.  Father did have fairly significant cardiac history.  No illicit drug use, no recent diet or medication changes, no excessive alcohol use.  No excessive stimulant use  Past Medical History Past Medical History:  Diagnosis Date   Anemia    Blood transfusion without reported diagnosis 2007   after gastric bypass surgery   Diabetes mellitus without complication (HCC) 2003   Hypertension 2008   Patient Active Problem List   Diagnosis Date Noted   Atrial flutter with rapid ventricular response (HCC) 08/23/2023   Atrial flutter (HCC) 08/23/2023   Ankle ulcer, left, with unspecified severity (HCC) 08/23/2023   Acute appendicitis 04/01/2019   Chronic anemia 04/01/2019   Iron deficiency anemia 04/01/2019   Acute perforated appendicitis 07/16/2016   Type 2 diabetes mellitus (HCC) 07/16/2016   HTN (hypertension) 07/16/2016   Breast mass, left/CT abd from 07/16/16 07/16/2016   Vitamin B12 deficiency 09/22/2012   Home Medication(s) Prior to Admission medications   Medication Sig Start Date End Date Taking? Authorizing Provider  cephALEXin (KEFLEX) 500 MG capsule Take 500 mg by mouth 4 (four) times daily.   Yes [provider]  ibuprofen  (ADVIL) 200 MG tablet Take 200 mg by mouth every 6 (six) hours as needed for mild pain.   Yes [provider]  metFORMIN (GLUCOPHAGE) 1000 MG tablet Take 1,000 mg by mouth 2 (two) times daily. 12/28/18  Yes [provider]  OZEMPIC, 1 MG/DOSE, 4 MG/3ML SOPN Inject 1 mg into the skin once a week.   Yes [provider]                                                                                                                                    Past Surgical History Past Surgical History:  Procedure Laterality Date   CESAREAN SECTION  [phone] and 1997   LAPAROSCOPIC APPENDECTOMY N/A 04/01/2019   Procedure: APPENDECTOMY LAPAROSCOPIC;  Surgeon: Franky Macho, MD;  Location: [facility];  Service: General;  Laterality: N/A;   LAPAROSCOPIC GASTRIC BYPASS  2007   Family History Family History  Problem Relation Age of Onset   Heart attack Father  Hypertension Father    Diabetes Father     Social History Social History   Tobacco Use   Smoking status: Never   Smokeless tobacco: Never  Vaping Use   Vaping status: Never Used  Substance Use Topics   Alcohol use: No   Drug use: No   Allergies Empagliflozin  Review of Systems Review of Systems  Constitutional:  Negative for chills and fever.  Respiratory:  Positive for chest tightness and shortness of breath.   Cardiovascular:  Positive for chest pain and palpitations.  Gastrointestinal:  Negative for abdominal pain, nausea and vomiting.  All other systems reviewed and are negative.   Physical Exam Vital Signs  I have reviewed the triage vital signs BP (!) 145/81   Pulse 70   Temp 98.3 F (36.8 C) (Oral)   Resp 20   Ht 5\' 10"  (1.778 m)   Wt 100.1 kg   SpO2 95%   BMI 31.66 kg/m  Physical Exam Vitals and nursing note reviewed.  Constitutional:      General: She is not in acute distress.    Appearance: Normal appearance.  HENT:     Head: Normocephalic and atraumatic.     Right Ear: External ear  normal.     Left Ear: External ear normal.     Nose: Nose normal.     Mouth/Throat:     Mouth: Mucous membranes are moist.  Eyes:     General: No scleral icterus.       Right eye: No discharge.        Left eye: No discharge.  Cardiovascular:     Rate and Rhythm: Tachycardia present. Rhythm irregular.     Pulses: Normal pulses.     Heart sounds: Normal heart sounds.  Pulmonary:     Effort: Pulmonary effort is normal. No respiratory distress.     Breath sounds: Normal breath sounds. No stridor.  Abdominal:     General: Abdomen is flat. There is no distension.     Palpations: Abdomen is soft.     Tenderness: There is no abdominal tenderness.  Musculoskeletal:     Cervical back: No rigidity.     Right lower leg: No edema.     Left lower leg: No edema.  Skin:    General: Skin is warm and dry.     Capillary Refill: Capillary refill takes less than 2 seconds.  Neurological:     Mental Status: She is alert.  Psychiatric:        Mood and Affect: Mood normal.        Behavior: Behavior normal. Behavior is cooperative.     ED Results and Treatments Labs (all labs ordered are listed, but only abnormal results are displayed) Labs Reviewed  COMPREHENSIVE METABOLIC PANEL - Abnormal; Notable for the following components:      Result Value   Potassium 3.3 (*)    CO2 20 (*)    Glucose, Bld 105 (*)    Calcium 8.6 (*)    All other components within normal limits  CBC WITH DIFFERENTIAL/PLATELET - Abnormal; Notable for the following components:   Hemoglobin 9.5 (*)    HCT 32.7 (*)    MCV 72.0 (*)    MCH 20.9 (*)    MCHC 29.1 (*)    RDW 17.2 (*)    All other components within normal limits  GLUCOSE, CAPILLARY - Abnormal; Notable for the following components:   Glucose-Capillary 131 (*)    All other components within normal  limits  MRSA NEXT GEN BY PCR, NASAL  TSH  T4, FREE  HEPARIN LEVEL (UNFRACTIONATED)  MAGNESIUM  PHOSPHORUS  HIV ANTIBODY (ROUTINE TESTING W REFLEX)   HEMOGLOBIN A1C  HEPARIN LEVEL (UNFRACTIONATED)  BASIC METABOLIC PANEL  CBC  PROTIME-INR  APTT  TROPONIN I (HIGH SENSITIVITY)  TROPONIN I (HIGH SENSITIVITY)                                                                                                                          Radiology DG Chest Port 1 View  Result Date: 08/23/2023 CLINICAL DATA:  Palpitations, chest pain, and shortness of breath EXAM: PORTABLE CHEST 1 VIEW COMPARISON:  Chest radiograph dated 04/01/2019 FINDINGS: Normal lung volumes. No focal consolidations. No pleural effusion or pneumothorax. Mildly enlarged cardiomediastinal silhouette. No acute osseous abnormality. IMPRESSION: 1.  No focal consolidations. 2. Mildly enlarged cardiomediastinal silhouette. Electronically Signed   By: Agustin Cree M.D.   On: 08/23/2023 09:27    Pertinent labs & imaging results that were available during my care of the patient were reviewed by me and considered in my medical decision making (see MDM for details).  Medications Ordered in ED Medications  diltiazem (CARDIZEM) 125 mg in dextrose 5% 125 mL (1 mg/mL) infusion (10 mg/hr Intravenous Infusion Verify 08/23/23 1347)  heparin ADULT infusion 100 units/mL (25000 units/243mL) (1,300 Units/hr Intravenous Infusion Verify 08/23/23 1347)  amiodarone (NEXTERONE) 1.8 mg/mL load via infusion 150 mg (150 mg Intravenous Bolus from Bag 08/23/23 1316)    Followed by  amiodarone (NEXTERONE PREMIX) 360-4.14 MG/200ML-% (1.8 mg/mL) IV infusion (60 mg/hr Intravenous New Bag/Given 08/23/23 1700)    Followed by  amiodarone (NEXTERONE PREMIX) 360-4.14 MG/200ML-% (1.8 mg/mL) IV infusion (30 mg/hr Intravenous New Bag/Given 08/23/23 1918)  sodium chloride flush (NS) 0.9 % injection 3 mL (has no administration in time range)  sodium chloride flush (NS) 0.9 % injection 3 mL (has no administration in time range)  0.9 %  sodium chloride infusion (has no administration in time range)  acetaminophen (TYLENOL)  tablet 650 mg (has no administration in time range)    Or  acetaminophen (TYLENOL) suppository 650 mg (has no administration in time range)  oxyCODONE (Oxy IR/ROXICODONE) immediate release tablet 5 mg (has no administration in time range)  HYDROmorphone (DILAUDID) injection 0.5-1 mg (has no administration in time range)  traZODone (DESYREL) tablet 25 mg (has no administration in time range)  senna-docusate (Senokot-S) tablet 1 tablet (has no administration in time range)  bisacodyl (DULCOLAX) EC tablet 5 mg (has no administration in time range)  sodium phosphate (FLEET) enema 1 enema (has no administration in time range)  ondansetron (ZOFRAN) tablet 4 mg (has no administration in time range)    Or  ondansetron (ZOFRAN) injection 4 mg (has no administration in time range)  ipratropium (ATROVENT) nebulizer solution 0.5 mg (has no administration in time range)  levalbuterol (XOPENEX) nebulizer solution 0.63 mg (has no administration in time range)  hydrALAZINE (APRESOLINE) injection 10 mg (has  no administration in time range)  atorvastatin (LIPITOR) tablet 20 mg (20 mg Oral Given 08/23/23 1542)  insulin aspart (novoLOG) injection 0-6 Units ( Subcutaneous Not Given 08/23/23 1639)  Chlorhexidine Gluconate Cloth 2 % PADS 6 each (has no administration in time range)  cephALEXin (KEFLEX) capsule 500 mg (500 mg Oral Given 08/23/23 1700)  sodium chloride 0.9 % bolus 1,000 mL (0 mLs Intravenous Stopped 08/23/23 0841)  diltiazem (CARDIZEM) injection 15 mg (15 mg Intravenous Given 08/23/23 0800)  aspirin chewable tablet 324 mg (324 mg Oral Given 08/23/23 0758)  potassium chloride SA (KLOR-CON M) CR tablet 40 mEq (40 mEq Oral Given 08/23/23 0903)  heparin bolus via infusion 4,500 Units (4,500 Units Intravenous Bolus from Bag 08/23/23 0907)  metoprolol tartrate (LOPRESSOR) injection 5 mg (5 mg Intravenous Given 08/23/23 1103)  metoprolol tartrate (LOPRESSOR) injection 5 mg (5 mg Intravenous Given 08/23/23  1140)  magnesium sulfate IVPB 2 g 50 mL (0 g Intravenous Stopped 08/23/23 1243)  metoprolol tartrate (LOPRESSOR) injection 5 mg (5 mg Intravenous Given 08/23/23 2015)                                                                                                                                     Procedures .Critical Care  Performed by: Sloan Leiter, DO Authorized by: Sloan Leiter, DO   Critical care provider statement:    Critical care time (minutes):  45   Critical care time was exclusive of:  Separately billable procedures and treating other patients   Critical care was necessary to treat or prevent imminent or life-threatening deterioration of the following conditions:  Cardiac failure   Critical care was time spent personally by me on the following activities:  Development of treatment plan with patient or surrogate, discussions with consultants, evaluation of patient's response to treatment, examination of patient, ordering and review of laboratory studies, ordering and review of radiographic studies, ordering and performing treatments and interventions, pulse oximetry, re-evaluation of patient's condition, review of old charts and obtaining history from patient or surrogate   Care discussed with: admitting provider     (including critical care time)  Medical Decision Making / ED Course    Medical Decision Making:    Kelsye Cydne Grahn is a 52 y.o. female  with past medical history as below, significant for hypertension, diabetes, IDA, B12 deficiency who presents to the ED with complaint of chest pain palpitations. The complaint involves an extensive differential diagnosis and also carries with it a high risk of complications and morbidity.  Serious etiology was considered. Ddx includes but is not limited to: Differential includes all life-threatening causes for chest pain. This includes but is not exclusive to acute coronary syndrome, aortic dissection, pulmonary embolism,  cardiac tamponade, community-acquired pneumonia, pericarditis, musculoskeletal chest wall pain, etc. In my evaluation of this patient's dyspnea my DDx includes, but is not limited to, pneumonia, pulmonary embolism, pneumothorax, pulmonary edema, metabolic  acidosis, asthma, COPD, cardiac cause, anemia, anxiety, etc.    Complete initial physical exam performed, notably the patient  was heart rate elevated, atrial flutter, no hypoxia.    Reviewed and confirmed nursing documentation for past medical history, family history, social history.  Vital signs reviewed.    Clinical Course as of 08/23/23 2047  Fri Aug 23, 2023  0749 CHADSVASC 3 [SG]  0817 Hemoglobin(!): 9.5 Improved from prior [SG]  0817 Potassium(!): 3.3 Will replace [SG]  0833 HR not improved with initial dilt bolus, will start gtt, start heparin gtt, plan admit.  [SG]  1142 Modest improvement with lopressor, will repeat, give magnesium sulfate  [SG]  1230 Spoke w. Dr Jenene Slicker, recommend add AMIOdarone, if unstable DCCV, o/w if still in RVR on Monday they can do DCCV [SG]  1405 Heart rate starting to improve with Amio  Plan for admission [SG]    Clinical Course User Index [SG] Sloan Leiter, DO     Patient appears been a flutter on telemetry.  Reports multiple episodes over the past week with similar symptoms that resolved spontaneously.  She has a CHA2DS2-VASc of 3.  This most recent episode appears to have start around 5 AM per the patient however she reports multiple episodes over the past week similar sensation.  Will trial diltiazem rather than cardioversion.   Spoke with cardiology, start amio. Can rebolus in AM if not in sinus at that time, if still in rapid rate plan DCCV and TEE on Monday per cardiology.   Plan admission, Dr Flossie Dibble accepting, Dr [facility] cardiology on consult. Okay to stay at [facility], see consult note.                Additional history obtained: -Additional history obtained from  spouse -External records from outside source obtained and reviewed including: Chart review including previous notes, labs, imaging, consultation notes including  Home medications, primary care documentation   Lab Tests: -I ordered, reviewed, and interpreted labs.   The pertinent results include:   Labs Reviewed  COMPREHENSIVE METABOLIC PANEL - Abnormal; Notable for the following components:      Result Value   Potassium 3.3 (*)    CO2 20 (*)    Glucose, Bld 105 (*)    Calcium 8.6 (*)    All other components within normal limits  CBC WITH DIFFERENTIAL/PLATELET - Abnormal; Notable for the following components:   Hemoglobin 9.5 (*)    HCT 32.7 (*)    MCV 72.0 (*)    MCH 20.9 (*)    MCHC 29.1 (*)    RDW 17.2 (*)    All other components within normal limits  GLUCOSE, CAPILLARY - Abnormal; Notable for the following components:   Glucose-Capillary 131 (*)    All other components within normal limits  MRSA NEXT GEN BY PCR, NASAL  TSH  T4, FREE  HEPARIN LEVEL (UNFRACTIONATED)  MAGNESIUM  PHOSPHORUS  HIV ANTIBODY (ROUTINE TESTING W REFLEX)  HEMOGLOBIN A1C  HEPARIN LEVEL (UNFRACTIONATED)  BASIC METABOLIC PANEL  CBC  PROTIME-INR  APTT  TROPONIN I (HIGH SENSITIVITY)  TROPONIN I (HIGH SENSITIVITY)    Notable for labs stable  EKG   EKG Interpretation Date/Time:  Friday August 23 2023 14:29:13 EDT Ventricular Rate:  136 PR Interval:    QRS Duration:  128 QT Interval:  364 QTC Calculation: 548 R Axis:   66  Text Interpretation: Atrial flutter Right bundle branch block Confirmed by Tanda Rockers ([id number]) on 08/23/2023 8:45:44 PM  Imaging Studies ordered: I ordered imaging studies including cxr I independently visualized the following imaging with scope of interpretation limited to determining acute life threatening conditions related to emergency care; findings noted above, significant for no ptx or pna I independently visualized and interpreted imaging. I agree  with the radiologist interpretation   Medicines ordered and prescription drug management: Meds ordered this encounter  Medications   sodium chloride 0.9 % bolus 1,000 mL   diltiazem (CARDIZEM) injection 15 mg   aspirin chewable tablet 324 mg   potassium chloride SA (KLOR-CON M) CR tablet 40 mEq   diltiazem (CARDIZEM) 125 mg in dextrose 5% 125 mL (1 mg/mL) infusion   heparin bolus via infusion 4,500 Units   heparin ADULT infusion 100 units/mL (25000 units/275mL)   metoprolol tartrate (LOPRESSOR) injection 5 mg   metoprolol tartrate (LOPRESSOR) injection 5 mg   magnesium sulfate IVPB 2 g 50 mL   FOLLOWED BY Linked Order Group    amiodarone (NEXTERONE) 1.8 mg/mL load via infusion 150 mg    amiodarone (NEXTERONE PREMIX) 360-4.14 MG/200ML-% (1.8 mg/mL) IV infusion    amiodarone (NEXTERONE PREMIX) 360-4.14 MG/200ML-% (1.8 mg/mL) IV infusion   DISCONTD: heparin injection 5,000 Units   sodium chloride flush (NS) 0.9 % injection 3 mL   sodium chloride flush (NS) 0.9 % injection 3 mL   0.9 %  sodium chloride infusion   OR Linked Order Group    acetaminophen (TYLENOL) tablet 650 mg    acetaminophen (TYLENOL) suppository 650 mg   oxyCODONE (Oxy IR/ROXICODONE) immediate release tablet 5 mg   HYDROmorphone (DILAUDID) injection 0.5-1 mg   traZODone (DESYREL) tablet 25 mg   senna-docusate (Senokot-S) tablet 1 tablet   bisacodyl (DULCOLAX) EC tablet 5 mg   sodium phosphate (FLEET) enema 1 enema   OR Linked Order Group    ondansetron (ZOFRAN) tablet 4 mg    ondansetron (ZOFRAN) injection 4 mg   ipratropium (ATROVENT) nebulizer solution 0.5 mg   levalbuterol (XOPENEX) nebulizer solution 0.63 mg   hydrALAZINE (APRESOLINE) injection 10 mg   atorvastatin (LIPITOR) tablet 20 mg   DISCONTD: lisinopril (ZESTRIL) tablet 20 mg   insulin aspart (novoLOG) injection 0-6 Units    Order Specific Question:   Correction coverage:    Answer:   Very Sensitive (ESRD/Dialysis)    Order Specific Question:   CBG  < 70:    Answer:   Implement Hypoglycemia Standing Orders and refer to Hypoglycemia Standing Orders sidebar report    Order Specific Question:   CBG 70 - 120:    Answer:   0 units    Order Specific Question:   CBG 121 - 150:    Answer:   0 units    Order Specific Question:   CBG 151 - 200:    Answer:   1 unit    Order Specific Question:   CBG 201-250:    Answer:   2 units    Order Specific Question:   CBG 251-300:    Answer:   3 units    Order Specific Question:   CBG 301-350:    Answer:   4 units    Order Specific Question:   CBG 351-400:    Answer:   5 units    Order Specific Question:   CBG > 400    Answer:   Give 6 units and call MD   Chlorhexidine Gluconate Cloth 2 % PADS 6 each   cephALEXin (KEFLEX) capsule 500 mg   metoprolol tartrate (LOPRESSOR) injection  5 mg    -I have reviewed the patients home medicines and have made adjustments as needed   Consultations Obtained: I requested consultation with the cardiology,  and discussed lab and imaging findings as well as pertinent plan - they recommend: amio > admit   Cardiac Monitoring: The patient was maintained on a cardiac monitor.  I personally viewed and interpreted the cardiac monitored which showed an underlying rhythm of: aflutter  Social Determinants of Health:  Diagnosis or treatment significantly limited by social determinants of health: lives at home   Reevaluation: After the interventions noted above, I reevaluated the patient and found that they have improved  Co morbidities that complicate the patient evaluation  Past Medical History:  Diagnosis Date   Anemia    Blood transfusion without reported diagnosis 2007   after gastric bypass surgery   Diabetes mellitus without complication (HCC) 2003   Hypertension 2008      Dispostion: Disposition decision including need for hospitalization was considered, and patient admitted to the hospital.    Final Clinical Impression(s) / ED Diagnoses Final  diagnoses:  Atrial flutter with rapid ventricular response (HCC)  Hypokalemia        Sloan Leiter, DO 08/23/23 2047

## 2023-08-23 NOTE — Consult Note (Addendum)
CARDIOLOGY CONSULT NOTE    Patient ID: Norma Carroll; [id number]; [birth year]   Admit date: 08/23/2023 Date of Consult: 08/23/2023  Primary Care Provider: Erasmo Downer, NP Primary Cardiologist:  Primary Electrophysiologist:     History of Present Illness:   Norma Carroll is a 63 F known to have HTN presented to the ER with palpitations waking her up from sleep this morning. Associated with chest pain radiating to her neck and jaw.  She had intermittent palpitations ongoing for the last few days but this morning the intensity of palpitations was severe that prompted ER visit.  Denies having any other symptoms of dizziness, presyncope and syncope.  EKG upon arrival showed atrial flutter with RVR HR 140s.  Currently on diltiazem drip at 15 mg/h  No prior history of atrial arrhythmias.  No family history of A-fib/flutter.  Past Medical History:  Diagnosis Date   Anemia    Blood transfusion without reported diagnosis 2007   after gastric bypass surgery   Diabetes mellitus without complication (HCC) 2003   Hypertension 2008    Past Surgical History:  Procedure Laterality Date   CESAREAN SECTION  [phone] and 1997   LAPAROSCOPIC APPENDECTOMY N/A 04/01/2019   Procedure: APPENDECTOMY LAPAROSCOPIC;  Surgeon: Franky Macho, MD;  Location: [facility];  Service: General;  Laterality: N/A;   LAPAROSCOPIC GASTRIC BYPASS  2007       Inpatient Medications: Scheduled Meds:  amiodarone  150 mg Intravenous Once   Continuous Infusions:  amiodarone     Followed by   amiodarone     diltiazem (CARDIZEM) infusion 15 mg/hr (08/23/23 1151)   heparin 1,300 Units/hr (08/23/23 1111)   PRN Meds:   Allergies:   No Known Allergies  Social History:   Social History   Socioeconomic History   Marital status: Married    Spouse name: Not on file   Number of children: Not on file   Years of education: Not on file   Highest education level: Not on file  Occupational History   Not on  file  Tobacco Use   Smoking status: Never   Smokeless tobacco: Never  Vaping Use   Vaping status: Never Used  Substance and Sexual Activity   Alcohol use: No   Drug use: No   Sexual activity: Yes  Other Topics Concern   Not on file  Social History Narrative   Not on file   Social Determinants of Health   Financial Resource Strain: Low Risk  (02/20/2021)   Overall Financial Resource Strain (CARDIA)    Difficulty of Paying Living Expenses: Not hard at all  Food Insecurity: No Food Insecurity (02/20/2021)   Hunger Vital Sign    Worried About Running Out of Food in the Last Year: Never true    Ran Out of Food in the Last Year: Never true  Transportation Needs: No Transportation Needs (02/20/2021)   PRAPARE - Administrator, Civil Service (Medical): No    Lack of Transportation (Non-Medical): No  Physical Activity: Inactive (02/20/2021)   Exercise Vital Sign    Days of Exercise per Week: 0 days    Minutes of Exercise per Session: 0 min  Stress: No Stress Concern Present (02/20/2021)   [employer] of Occupational Health - Occupational Stress Questionnaire    Feeling of Stress : Only a little  Social Connections: Socially Isolated (02/20/2021)   Social Connection and Isolation Panel [NHANES]    Frequency of Communication with Friends and Family: Never  Frequency of Social Gatherings with Friends and Family: Never    Attends Religious Services: Never    Database administrator or Organizations: No    Attends Banker Meetings: Never    Marital Status: Married  Catering manager Violence: Not At Risk (02/20/2021)   Humiliation, Afraid, Rape, and Kick questionnaire    Fear of Current or Ex-Partner: No    Emotionally Abused: No    Physically Abused: No    Sexually Abused: No    Family History:    Family History  Problem Relation Age of Onset   Heart attack Father    Hypertension Father    Diabetes Father      ROS:  Please see the history of  present illness.  ROS  All other ROS reviewed and negative.     Physical Exam/Data:   Vitals:   08/23/23 1204 08/23/23 1207 08/23/23 1215 08/23/23 1230  BP:  105/71  120/86  Pulse: (!) 142 (!) 140 (!) 144 (!) 143  Resp: 20 (!) 24 15 15   Temp:      TempSrc:      SpO2: 98% 97% 97% 96%  Weight:      Height:        Intake/Output Summary (Last 24 hours) at 08/23/2023 1311 Last data filed at 08/23/2023 1303 Gross per 24 hour  Intake 139.54 ml  Output --  Net 139.54 ml   Filed Weights   08/23/23 0728  Weight: 97.1 kg   Body mass index is 30.71 kg/m.  General:  Well nourished, well developed, in no acute distres HEENT: normal Lymph: no adenopathy Neck: no JVD Endocrine:  No thryomegaly Vascular: No carotid bruits; FA pulses 2+ bilaterally without bruits  Cardiac:  normal S1, S2; RRR; no murmur  Lungs:  clear to auscultation bilaterally, no wheezing, rhonchi or rales  Abd: soft, nontender, no hepatomegaly  Ext: no edema Musculoskeletal:  No deformities, BUE and BLE strength normal and equal Skin: warm and dry  Neuro:  CNs 2-12 intact, no focal abnormalities noted Psych:  Normal affect   EKG:  The EKG was personally reviewed and demonstrates:   Telemetry:  Telemetry was personally reviewed and demonstrates:    Relevant CV Studies:   Laboratory Data:  Chemistry Recent Labs  Lab 08/23/23 0730  NA 135  K 3.3*  CL 106  CO2 20*  GLUCOSE 105*  BUN 13  CREATININE 0.65  CALCIUM 8.6*  GFRNONAA >60  ANIONGAP 9    Recent Labs  Lab 08/23/23 0730  PROT 7.5  ALBUMIN 4.1  AST 18  ALT 17  ALKPHOS 85  BILITOT 0.4   Hematology Recent Labs  Lab 08/23/23 0730  WBC 10.1  RBC 4.54  HGB 9.5*  HCT 32.7*  MCV 72.0*  MCH 20.9*  MCHC 29.1*  RDW 17.2*  PLT 325   Cardiac EnzymesNo results for input(s): "TROPONINI" in the last 168 hours. No results for input(s): "TROPIPOC" in the last 168 hours.  BNPNo results for input(s): "BNP", "PROBNP" in the last 168 hours.   DDimer No results for input(s): "DDIMER" in the last 168 hours.  Radiology/Studies:  DG Chest Port 1 View  Result Date: 08/23/2023 CLINICAL DATA:  Palpitations, chest pain, and shortness of breath EXAM: PORTABLE CHEST 1 VIEW COMPARISON:  Chest radiograph dated 04/01/2019 FINDINGS: Normal lung volumes. No focal consolidations. No pleural effusion or pneumothorax. Mildly enlarged cardiomediastinal silhouette. No acute osseous abnormality. IMPRESSION: 1.  No focal consolidations. 2. Mildly enlarged cardiomediastinal silhouette.  Electronically Signed   By: Agustin Cree M.D.   On: 08/23/2023 09:27    Assessment and Plan:   Atrial flutter with RVR, new onset HTN, controlled  -Presented with palpitations that woke her up from sleep this morning associated with chest pain radiating to her neck and jaw.  EKG in the ER showed atrial flutter with RVR, HR 140s.  Currently on diltiazem drip 15 mg/h which I will continue and add amiodarone drip.  If HR continues to remain poorly controlled, she will benefit from TEE guided DCCV on 08/26/2023.  Continue heparin drip. -Obtain 2D echocardiogram after heart rates are better controlled. -TSH within normal limits, 0.991.  Outpatient OSA evaluation.   For questions or updates, please contact [facility] Please consult [email or web address] for contact info under Cardiology/STEMI.   Signed, Herbert Deaner, MD 08/23/2023 1:11 PM

## 2023-08-23 NOTE — Assessment & Plan Note (Addendum)
-  Transfer out of ICU, continue monitoring on telemetry, remained in normal sinus rhythm Heart rate as low as 57 overnight -Off both drip Cardizem/amiodarone since 08/24/2023 -Has been on p.o. Cardizem and amiodarone  Amiodarone to 200 mg p.o. >>  D/Ced 08/25/2023 due to heart rate 57 -PO Cardizem 3 times daily >> changing to Cardizem CD 240 mg p.o. daily today 08/25/2023   -Added p.o. metoprolol 50 mg twice daily  -Discontinuing heparin drip, initiating Eliquis -CHA2DS2-VASc 2 score 3  -2D echocardiogram >> EJF 60 to 65%.  Mild LVH  Grade II diastolic dysfunction - Right ventricular systolic function is normal.   -Recycling cardiac enzymes: Troponin 10, 14, -Cardiology consulted, further treatment per cardiology

## 2023-08-24 DIAGNOSIS — I4892 Unspecified atrial flutter: Secondary | ICD-10-CM | POA: Diagnosis not present

## 2023-08-24 LAB — CBC
HCT: 28.7 % — ABNORMAL LOW (ref 36.0–46.0)
Hemoglobin: 8 g/dL — ABNORMAL LOW (ref 12.0–15.0)
MCH: 20.7 pg — ABNORMAL LOW (ref 26.0–34.0)
MCHC: 27.9 g/dL — ABNORMAL LOW (ref 30.0–36.0)
MCV: 74.2 fL — ABNORMAL LOW (ref 80.0–100.0)
Platelets: 257 10*3/uL (ref 150–400)
RBC: 3.87 MIL/uL (ref 3.87–5.11)
RDW: 17.2 % — ABNORMAL HIGH (ref 11.5–15.5)
WBC: 9.1 10*3/uL (ref 4.0–10.5)
nRBC: 0 % (ref 0.0–0.2)

## 2023-08-24 LAB — BASIC METABOLIC PANEL
Anion gap: 7 (ref 5–15)
BUN: 17 mg/dL (ref 6–20)
CO2: 21 mmol/L — ABNORMAL LOW (ref 22–32)
Calcium: 8.5 mg/dL — ABNORMAL LOW (ref 8.9–10.3)
Chloride: 107 mmol/L (ref 98–111)
Creatinine, Ser: 0.71 mg/dL (ref 0.44–1.00)
GFR, Estimated: >60 mL/min (ref >60)
Glucose, Bld: 117 mg/dL — ABNORMAL HIGH (ref 70–99)
Potassium: 3.6 mmol/L (ref 3.5–5.1)
Sodium: 135 mmol/L (ref 135–145)

## 2023-08-24 LAB — HEPARIN LEVEL (UNFRACTIONATED): Heparin Unfractionated: 0.25 [IU]/mL — ABNORMAL LOW (ref 0.30–0.70)

## 2023-08-24 LAB — GLUCOSE, CAPILLARY
Glucose-Capillary: 104 mg/dL — ABNORMAL HIGH (ref 70–99)
Glucose-Capillary: 116 mg/dL — ABNORMAL HIGH (ref 70–99)
Glucose-Capillary: 183 mg/dL — ABNORMAL HIGH (ref 70–99)
Glucose-Capillary: 239 mg/dL — ABNORMAL HIGH (ref 70–99)

## 2023-08-24 LAB — PROTIME-INR
INR: 1.1 (ref 0.8–1.2)
Prothrombin Time: 14.2 s (ref 11.4–15.2)

## 2023-08-24 LAB — APTT: aPTT: 51 s — ABNORMAL HIGH (ref 24–36)

## 2023-08-24 MED ORDER — DILTIAZEM HCL 60 MG PO TABS
90.0000 mg | ORAL_TABLET | Freq: Three times a day (TID) | ORAL | Status: DC
  Administered 2023-08-24 – 2023-08-25 (×2): 90 mg via ORAL
  Filled 2023-08-24 (×2): qty 1

## 2023-08-24 MED ORDER — AMIODARONE HCL 200 MG PO TABS
200.0000 mg | ORAL_TABLET | Freq: Every day | ORAL | Status: DC
  Filled 2023-08-24: qty 1

## 2023-08-24 MED ORDER — AMIODARONE HCL 200 MG PO TABS
200.0000 mg | ORAL_TABLET | Freq: Two times a day (BID) | ORAL | Status: DC
  Administered 2023-08-24: 200 mg via ORAL
  Filled 2023-08-24: qty 1

## 2023-08-24 MED ORDER — LISINOPRIL 10 MG PO TABS
20.0000 mg | ORAL_TABLET | Freq: Every day | ORAL | Status: DC
  Administered 2023-08-24 – 2023-08-25 (×2): 20 mg via ORAL
  Filled 2023-08-24 (×2): qty 2

## 2023-08-24 MED ORDER — METOPROLOL TARTRATE 50 MG PO TABS
50.0000 mg | ORAL_TABLET | Freq: Two times a day (BID) | ORAL | Status: DC
  Administered 2023-08-24 – 2023-08-25 (×4): 50 mg via ORAL
  Filled 2023-08-24 (×4): qty 1

## 2023-08-24 MED ORDER — DILTIAZEM HCL 60 MG PO TABS
60.0000 mg | ORAL_TABLET | Freq: Three times a day (TID) | ORAL | Status: DC
  Administered 2023-08-24: 60 mg via ORAL
  Filled 2023-08-24: qty 1

## 2023-08-24 MED ORDER — DILTIAZEM HCL 30 MG PO TABS: 30.0000 mg | ORAL_TABLET | Freq: Three times a day (TID) | ORAL | Status: DC

## 2023-08-24 MED ORDER — APIXABAN 5 MG PO TABS
5.0000 mg | ORAL_TABLET | Freq: Two times a day (BID) | ORAL | Status: DC
  Administered 2023-08-24 – 2023-08-26 (×5): 5 mg via ORAL
  Filled 2023-08-24 (×5): qty 1

## 2023-08-24 NOTE — Progress Notes (Signed)
PROGRESS NOTE    Patient: Norma Carroll                            PCP: Erasmo Downer, NP                    DOB: April 24, 1971            DOA: 08/23/2023 WUJ:[id number]             DOS: 08/24/2023, 11:34 AM   LOS: 1 day   Date of Service: The patient was seen and examined on 08/24/2023  Subjective:   The patient was seen and examined this morning. Hemodynamically stable. No issues overnight .  Still on both drip Cardizem and amiodarone, converted to normal sinus rhythm overnight Denies any chest pain or shortness of breath  Brief Narrative:   Norma Carroll is a 52 year old female with history of HTN, DM II, iron deficiency anemia, B12 deficiency.  Presented to ED with chief complaint of chest pain and palpitation.  She reported that this morning she woke up with the chest pain and noticed palpitation.  And had some shortness of breath.  She described the pain as substernal mid chest area, radiating to her jaw.  Felt that her heart was racing but this been going on for past 2 weeks which resolved spontaneously.  Episode of palpitation was 2 to 3 days ago and was not associated with chest pain. For she has no diagnosis of arrhythmia. Denies of having any recent illnesses no change in medication or diet.   ED valuation/course: Blood pressure (!) 139/98, pulse (!) 137, temperature (!) 97.4 F (36.3 C),  RR 17, SpO2 97%.  Hemoglobin 9.5, potassium 3.3, troponin 14, TSH 0.99, free T40.94, Chest x-ray-clear, EKG-reviewed  Upon arrival heart rate was 140s and flutter..  Did not respond to initial treatment with amiodarone bolus and Cardizem.  Cardiologist was consulted, patient started on amiodarone and Cardizem drip, Also heparin drip.   Requested patient to be admitted for rate control.      Assessment & Plan:   Principal Problem:   Atrial flutter with rapid ventricular response (HCC) Active Problems:   HTN (hypertension)   Ankle ulcer, left, with  unspecified severity (HCC)   Type 2 diabetes mellitus (HCC)   Chronic anemia   Iron deficiency anemia   Atrial flutter (HCC)     Assessment and Plan: * Atrial flutter with rapid ventricular response (HCC) - Close monitoring on ICU setting -Will continue amiodarone and Cardizem drip per cardiology --tapering off the drips -Converted to normal sinus rhythm overnight, switching  Amiodarone to 200 mg p.o. twice daily Cardizem 60 mg p.o. 3 times daily  -Discontinuing heparin drip, initiating Eliquis -CHA2DS2-VASc 2 score 3  -2D echocardiogram >>  -Recycling cardiac enzymes: Troponin 10, 14, -Cardiology consulted, further treatment per cardiology including plan for future cardioversion    Ankle ulcer, left, with unspecified severity (HCC) - Healing on antibiotics as an outpatient amoxicillin will be continued  HTN (hypertension) - Currently stable, will hold home medication lisinopril as patient is now on Cardizem drip -On IV hydralazine -Discontinue diltiazem drip, switching to p.o. 3 times daily 60 mg -Resuming home medication of lisinopril  Iron deficiency anemia - Monitoring CBC, stable  Chronic anemia Monitoring H&H, chronic, stable    Latest Ref Rng & Units 08/24/2023    5:29 AM 08/23/2023    7:30 AM 02/20/2021  9:24 AM  CBC  WBC 4.0 - 10.5 K/uL 9.1  10.1  8.8   Hemoglobin 12.0 - 15.0 g/dL 8.0  9.5  7.2   Hematocrit 36.0 - 46.0 % 28.7  32.7  27.9   Platelets 150 - 400 K/uL 257  325  304      Type 2 diabetes mellitus (HCC) - Home medication metformin will be held -Will check her CBG q. ACHS, SSI coverage -Diabetic/cardiac diet - A1c: 7.4    ------------------------------------------------------------------------------------------------------------------------ Nutritional status:  The patient's BMI is: Body mass index is 31.54 kg/m. I agree with the assessment and plan as outlined  -------------------------------------------------------------------------------------------------------------------------  DVT prophylaxis:  TED hose Start: 08/23/23 1420 SCDs Start: 08/23/23 1420 apixaban (ELIQUIS) tablet 5 mg   Code Status:   Code Status: Full Code  Family Communication: Significant other present at bedside updated -Advance care planning has been discussed.   Admission status:   Status is: Inpatient Remains inpatient appropriate because: Needing amiodarone drip/Cardizem drip for better rate control   Disposition: From  - home             Planning for discharge in 1-2 days: to   Procedures:   No admission procedures for hospital encounter.   Antimicrobials:  Anti-infectives (From admission, onward)    Start     Dose/Rate Route Frequency Ordered Stop   08/23/23 1800  cephALEXin (KEFLEX) capsule 500 mg        500 mg Oral Every 6 hours 08/23/23 1607 08/25/23 2359        Medication:   amiodarone  200 mg Oral BID   apixaban  5 mg Oral BID   atorvastatin  20 mg Oral Daily   cephALEXin  500 mg Oral Q6H   Chlorhexidine Gluconate Cloth  6 each Topical Q0600   diltiazem  60 mg Oral Q8H   insulin aspart  0-6 Units Subcutaneous TID WC   metoprolol tartrate  50 mg Oral BID   sodium chloride flush  3 mL Intravenous Q12H   sodium chloride flush  3 mL Intravenous Q12H    sodium chloride, acetaminophen **OR** acetaminophen, bisacodyl, hydrALAZINE, HYDROmorphone (DILAUDID) injection, ipratropium, levalbuterol, ondansetron **OR** ondansetron (ZOFRAN) IV, oxyCODONE, senna-docusate, sodium phosphate, traZODone   Objective:   Vitals:   08/24/23 0952 08/24/23 1000 08/24/23 1055 08/24/23 1116  BP: (!) 169/87 (!) 174/81 (!) 176/77   Pulse: 63 65 69   Resp: 17 17    Temp:    98.7 F (37.1 C)  TempSrc:    Oral  SpO2: 96% 96% 97%   Weight:      Height:        Intake/Output Summary (Last 24 hours) at 08/24/2023 1134 Last data filed at 08/24/2023 0700 Gross  per 24 hour  Intake 789.46 ml  Output --  Net 789.46 ml   Filed Weights   08/23/23 0728 08/23/23 1523 08/24/23 0500  Weight: 97.1 kg 100.1 kg 99.7 kg     Physical examination:   Constitution:  Alert, cooperative, no distress,  Appears calm and comfortable  Psychiatric:   Normal and stable mood and affect, cognition intact,   HEENT:        Normocephalic, PERRL, otherwise with in Normal limits  Chest:         Chest symmetric Cardio vascular:  S1/S2, RRR, No murmure, No Rubs or Gallops  pulmonary: Clear to auscultation bilaterally, respirations unlabored, negative wheezes / crackles Abdomen: Soft, non-tender, non-distended, bowel sounds,no masses, no organomegaly Muscular skeletal: Limited exam -  in bed, able to move all 4 extremities,   Neuro: CNII-XII intact. , normal motor and sensation, reflexes intact  Extremities: No pitting edema lower extremities, +2 pulses  Skin: Dry, warm to touch, negative for any Rashes, No open wounds Wounds: per nursing documentation   ------------------------------------------------------------------------------------------------------------------------------------------    LABs:     Latest Ref Rng & Units 08/24/2023    5:29 AM 08/23/2023    7:30 AM 02/20/2021    9:24 AM  CBC  WBC 4.0 - 10.5 K/uL 9.1  10.1  8.8   Hemoglobin 12.0 - 15.0 g/dL 8.0  9.5  7.2   Hematocrit 36.0 - 46.0 % 28.7  32.7  27.9   Platelets 150 - 400 K/uL 257  325  304       Latest Ref Rng & Units 08/24/2023    5:29 AM 08/23/2023    7:30 AM 04/02/2019    7:01 AM  CMP  Glucose 70 - 99 mg/dL 161  096  045   BUN 6 - 20 mg/dL 17  13  15    Creatinine 0.44 - 1.00 mg/dL 4.09  8.11  9.14   Sodium 135 - 145 mmol/L 135  135  137   Potassium 3.5 - 5.1 mmol/L 3.6  3.3  3.7   Chloride 98 - 111 mmol/L 107  106  106   CO2 22 - 32 mmol/L 21  20  23    Calcium 8.9 - 10.3 mg/dL 8.5  8.6  8.2   Total Protein 6.5 - 8.1 g/dL  7.5  6.0   Total Bilirubin 0.3 - 1.2 mg/dL  0.4  0.7    Alkaline Phos 38 - 126 U/L  85  51   AST 15 - 41 U/L  18  11   ALT 0 - 44 U/L  17  8        Micro Results Recent Results (from the past 240 hour(s))  MRSA Next Gen by PCR, Nasal     Status: None   Collection Time: 08/23/23  3:18 PM   Specimen: Nasal Mucosa; Nasal Swab  Result Value Ref Range Status   MRSA by PCR Next Gen NOT DETECTED NOT DETECTED Final    Comment: (NOTE) The GeneXpert MRSA Assay (FDA approved for NASAL specimens only), is one component of a comprehensive MRSA colonization surveillance program. It is not intended to diagnose MRSA infection nor to guide or monitor treatment for MRSA infections. Test performance is not FDA approved in patients less than 7 years old. Performed at [facility], [address] [place], [place] [address]     Radiology Reports No results found.  SIGNED: Kendell Bane, MD, FHM. FAAFP. [facility] - [facility] Critical care time spent - 55 min.  In seeing, evaluating and examining the patient. Reviewing medical records, labs, drawn plan of care. [facility],  Pager (please use [email or web address] to page/ text) Please use Epic Secure Chat for non-urgent communication (7AM-7PM)  If 7PM-7AM, please contact night-coverage [email or web address], 08/24/2023, 11:34 AM

## 2023-08-24 NOTE — Discharge Instructions (Signed)

## 2023-08-24 NOTE — Plan of Care (Signed)

## 2023-08-24 NOTE — Progress Notes (Addendum)
PHARMACY - ANTICOAGULATION CONSULT NOTE  Pharmacy Consult for heparin Indication: atrial fibrillation  Allergies  Allergen Reactions   Empagliflozin Other (See Comments)    Stomach pain    Patient Measurements: Height: 5\' 10"  (177.8 cm) Weight: 99.7 kg (219 lb 12.8 oz) IBW/kg (Calculated) : 68.5 Heparin Dosing Weight: 89 kg  Vital Signs: Temp: 97.7 F (36.5 C) (10/12 0800) Temp Source: Oral (10/12 0800) BP: 160/83 (10/12 0704) Pulse Rate: 65 (10/12 0704)  Labs: Recent Labs    08/23/23 0730 08/23/23 0942 08/23/23 1445 08/24/23 0529  HGB 9.5*  --   --  8.0*  HCT 32.7*  --   --  28.7*  PLT 325  --   --  257  APTT  --   --   --  51*  LABPROT  --   --   --  14.2  INR  --   --   --  1.1  HEPARINUNFRC  --   --  0.35 0.25*  CREATININE 0.65  --   --  0.71  TROPONINIHS 10 14  --   --     Estimated Creatinine Clearance: 104 mL/min (by C-G formula based on SCr of 0.71 mg/dL).   Medical History: Past Medical History:  Diagnosis Date   Anemia    Blood transfusion without reported diagnosis 2007   after gastric bypass surgery   Diabetes mellitus without complication (HCC) 2003   Hypertension 2008    Medications:  Medications Prior to Admission  Medication Sig Dispense Refill Last Dose   cephALEXin (KEFLEX) 500 MG capsule Take 500 mg by mouth 4 (four) times daily.   08/22/2023   ibuprofen (ADVIL) 200 MG tablet Take 200 mg by mouth every 6 (six) hours as needed for mild pain.   08/22/2023   metFORMIN (GLUCOPHAGE) 1000 MG tablet Take 1,000 mg by mouth 2 (two) times daily.   08/22/2023   OZEMPIC, 1 MG/DOSE, 4 MG/3ML SOPN Inject 1 mg into the skin once a week.   08/19/2023    Assessment: Pharmacy consulted to dose heparin in patient with atrial fibrillation.  Patient is not on anticoagulation prior to admission.    HL 0.25- slightly subtherapeutic Hgb 9.5> 8.0- monitor  Goal of Therapy:  Heparin level 0.3-0.7 units/ml Monitor platelets by anticoagulation protocol:  Yes   Plan:  Increase heparin infusion to 1450 units/hr Heparin level in 6 hours and daily Continue to monitor H&H and platelets.  Judeth Cornfield, PharmD Clinical Pharmacist 08/24/2023 8:10 AM

## 2023-08-24 NOTE — Progress Notes (Signed)
PHARMACY - ANTICOAGULATION CONSULT NOTE  Pharmacy Consult for heparin >> apixaban Indication: atrial fibrillation  Allergies  Allergen Reactions   Empagliflozin Other (See Comments)    Stomach pain    Patient Measurements: Height: 5\' 10"  (177.8 cm) Weight: 99.7 kg (219 lb 12.8 oz) IBW/kg (Calculated) : 68.5 Heparin Dosing Weight: 89 kg  Vital Signs: Temp: 97.7 F (36.5 C) (10/12 0800) Temp Source: Oral (10/12 0800) BP: 160/83 (10/12 0704) Pulse Rate: 65 (10/12 0704)  Labs: Recent Labs    08/23/23 0730 08/23/23 0942 08/23/23 1445 08/24/23 0529  HGB 9.5*  --   --  8.0*  HCT 32.7*  --   --  28.7*  PLT 325  --   --  257  APTT  --   --   --  51*  LABPROT  --   --   --  14.2  INR  --   --   --  1.1  HEPARINUNFRC  --   --  0.35 0.25*  CREATININE 0.65  --   --  0.71  TROPONINIHS 10 14  --   --     Estimated Creatinine Clearance: 104 mL/min (by C-G formula based on SCr of 0.71 mg/dL).   Medical History: Past Medical History:  Diagnosis Date   Anemia    Blood transfusion without reported diagnosis 2007   after gastric bypass surgery   Diabetes mellitus without complication (HCC) 2003   Hypertension 2008    Medications:  Medications Prior to Admission  Medication Sig Dispense Refill Last Dose   cephALEXin (KEFLEX) 500 MG capsule Take 500 mg by mouth 4 (four) times daily.   08/22/2023   ibuprofen (ADVIL) 200 MG tablet Take 200 mg by mouth every 6 (six) hours as needed for mild pain.   08/22/2023   metFORMIN (GLUCOPHAGE) 1000 MG tablet Take 1,000 mg by mouth 2 (two) times daily.   08/22/2023   OZEMPIC, 1 MG/DOSE, 4 MG/3ML SOPN Inject 1 mg into the skin once a week.   08/19/2023    Assessment: Pharmacy consulted to dose heparin in patient with atrial fibrillation.  Patient is not on anticoagulation prior to admission.    HL 0.25- slightly subtherapeutic Hgb 9.5> 8.0- monitor  Goal of Therapy:  Heparin level 0.3-0.7 units/ml Monitor platelets by anticoagulation  protocol: Yes   Plan:  Stop heparin infusion Start apixaban 5 mg twice daily Continue to monitor H&H and platelets.  Judeth Cornfield, PharmD Clinical Pharmacist 08/24/2023 9:36 AM

## 2023-08-25 ENCOUNTER — Inpatient Hospital Stay (HOSPITAL_COMMUNITY): Payer: Medicaid Other

## 2023-08-25 ENCOUNTER — Other Ambulatory Visit (HOSPITAL_COMMUNITY): Payer: Self-pay | Admitting: *Deleted

## 2023-08-25 DIAGNOSIS — I4892 Unspecified atrial flutter: Secondary | ICD-10-CM | POA: Diagnosis not present

## 2023-08-25 DIAGNOSIS — I4891 Unspecified atrial fibrillation: Secondary | ICD-10-CM | POA: Diagnosis not present

## 2023-08-25 LAB — BASIC METABOLIC PANEL
Anion gap: 6 (ref 5–15)
BUN: 21 mg/dL — ABNORMAL HIGH (ref 6–20)
CO2: 22 mmol/L (ref 22–32)
Calcium: 8.5 mg/dL — ABNORMAL LOW (ref 8.9–10.3)
Chloride: 109 mmol/L (ref 98–111)
Creatinine, Ser: 0.73 mg/dL (ref 0.44–1.00)
GFR, Estimated: >60 mL/min (ref >60)
Glucose, Bld: 109 mg/dL — ABNORMAL HIGH (ref 70–99)
Potassium: 3.9 mmol/L (ref 3.5–5.1)
Sodium: 137 mmol/L (ref 135–145)

## 2023-08-25 LAB — ECHOCARDIOGRAM COMPLETE
Area-P 1/2: 2.91 cm2
Height: 70 in
MV M vel: 4.85 m/s
MV Peak grad: 94.1 mm[Hg]
S' Lateral: 3.6 cm
Weight: 3521.6 [oz_av]

## 2023-08-25 LAB — CBC
HCT: 28.4 % — ABNORMAL LOW (ref 36.0–46.0)
Hemoglobin: 8.2 g/dL — ABNORMAL LOW (ref 12.0–15.0)
MCH: 21.4 pg — ABNORMAL LOW (ref 26.0–34.0)
MCHC: 28.9 g/dL — ABNORMAL LOW (ref 30.0–36.0)
MCV: 74.2 fL — ABNORMAL LOW (ref 80.0–100.0)
Platelets: 270 10*3/uL (ref 150–400)
RBC: 3.83 MIL/uL — ABNORMAL LOW (ref 3.87–5.11)
RDW: 17.5 % — ABNORMAL HIGH (ref 11.5–15.5)
WBC: 9.4 10*3/uL (ref 4.0–10.5)
nRBC: 0 % (ref 0.0–0.2)

## 2023-08-25 LAB — GLUCOSE, CAPILLARY
Glucose-Capillary: 110 mg/dL — ABNORMAL HIGH (ref 70–99)
Glucose-Capillary: 107 mg/dL — ABNORMAL HIGH (ref 70–99)
Glucose-Capillary: 209 mg/dL — ABNORMAL HIGH (ref 70–99)
Glucose-Capillary: 96 mg/dL (ref 70–99)

## 2023-08-25 MED ORDER — DILTIAZEM HCL ER COATED BEADS 180 MG PO CP24: 180.0000 mg | ORAL_CAPSULE | Freq: Every day | ORAL | Status: DC

## 2023-08-25 MED ORDER — DILTIAZEM HCL ER COATED BEADS 240 MG PO CP24
240.0000 mg | ORAL_CAPSULE | Freq: Every day | ORAL | Status: DC
  Filled 2023-08-25: qty 1

## 2023-08-25 MED ORDER — MEDIHONEY WOUND/BURN DRESSING EX PSTE
1.0000 | PASTE | Freq: Every day | CUTANEOUS | Status: DC
  Administered 2023-08-26: 1 via TOPICAL
  Filled 2023-08-25: qty 44

## 2023-08-25 MED ORDER — DILTIAZEM HCL ER COATED BEADS 180 MG PO CP24
180.0000 mg | ORAL_CAPSULE | Freq: Once | ORAL | Status: AC
  Administered 2023-08-25: 180 mg via ORAL
  Filled 2023-08-25: qty 1

## 2023-08-25 NOTE — Progress Notes (Addendum)
PROGRESS NOTE    Patient: Norma Carroll                            PCP: Erasmo Downer, NP                    DOB: 1971-08-18            DOA: 08/23/2023 ZOX:[id number]             DOS: 08/25/2023, 12:14 PM   LOS: 2 days   Date of Service: The patient was seen and examined on 08/25/2023  Subjective:   The patient was seen and examined this morning, stable no acute distress denies any chest pain palpitation or shortness of breath  Remained in normal sinus rhythm  Brief Narrative:   Rennae Carroll is a 52 year old female with history of HTN, DM II, iron deficiency anemia, B12 deficiency.  Presented to ED with chief complaint of chest pain and palpitation.  She reported that this morning she woke up with the chest pain and noticed palpitation.  And had some shortness of breath.  She described the pain as substernal mid chest area, radiating to her jaw.  Felt that her heart was racing but this been going on for past 2 weeks which resolved spontaneously.  Episode of palpitation was 2 to 3 days ago and was not associated with chest pain. For she has no diagnosis of arrhythmia. Denies of having any recent illnesses no change in medication or diet.   ED valuation/course: Blood pressure (!) 139/98, pulse (!) 137, temperature (!) 97.4 F (36.3 C),  RR 17, SpO2 97%.  Hemoglobin 9.5, potassium 3.3, troponin 14, TSH 0.99, free T40.94, Chest x-ray-clear, EKG-reviewed  Upon arrival heart rate was 140s and flutter..  Did not respond to initial treatment with amiodarone bolus and Cardizem.  Cardiologist was consulted, patient started on amiodarone and Cardizem drip, Also heparin drip.   Requested patient to be admitted for rate control.      Assessment & Plan:   Principal Problem:   Atrial flutter with rapid ventricular response (HCC) Active Problems:   HTN (hypertension)   Ankle ulcer, left, with unspecified severity (HCC)   Type 2 diabetes mellitus (HCC)    Chronic anemia   Iron deficiency anemia   Atrial flutter (HCC)     Assessment and Plan: * Atrial flutter with rapid ventricular response (HCC) -Transfer out of ICU, continue monitoring on telemetry, remained in normal sinus rhythm Heart rate as low as 57 overnight -Off both drip Cardizem/amiodarone since 08/24/2023 -Has been on p.o. Cardizem and amiodarone  Amiodarone to 200 mg p.o. >>  D/Ced 08/25/2023 due to heart rate 57 -PO Cardizem 3 times daily >> changing to Cardizem CD 240 mg p.o. daily today 08/25/2023   -Added p.o. metoprolol 50 mg twice daily  -Discontinuing heparin drip, initiating Eliquis -CHA2DS2-VASc 2 score 3  -2D echocardiogram >> EJF 60 to 65%.  Mild LVH  Grade II diastolic dysfunction - Right ventricular systolic function is normal.   -Recycling cardiac enzymes: Troponin 10, 14, -Cardiology consulted, further treatment per cardiology   Ankle ulcer, left, with unspecified severity (HCC) - Healing on antibiotics as an outpatient amoxicillin will be continued  HTN (hypertension) -Lisinopril was held on admission -PRN IV hydralazine -- Hypertensive: Starting:  lisinopril, metoprolol, Cardizem   Atrial flutter (HCC) Resolved  Iron deficiency anemia - Monitoring CBC, stable  Chronic anemia Monitoring H&H, chronic,  stable    Latest Ref Rng & Units 08/25/2023    5:02 AM 08/24/2023    5:29 AM 08/23/2023    7:30 AM  CBC  WBC 4.0 - 10.5 K/uL 9.4  9.1  10.1   Hemoglobin 12.0 - 15.0 g/dL 8.2  8.0  9.5   Hematocrit 36.0 - 46.0 % 28.4  28.7  32.7   Platelets 150 - 400 K/uL 270  257  325      Type 2 diabetes mellitus (HCC) - Home medication metformin will be held -Will check her CBG q. ACHS, SSI coverage -Diabetic/cardiac diet - A1c: 7.4    ------------------------------------------------------------------------------------------------------------------------ Nutritional status:  The patient's BMI is: Body mass index is 31.58 kg/m. I agree with  the assessment and plan as outlined -------------------------------------------------------------------------------------------------------------------------  DVT prophylaxis:  TED hose Start: 08/23/23 1420 SCDs Start: 08/23/23 1420 apixaban (ELIQUIS) tablet 5 mg   Code Status:   Code Status: Full Code  Family Communication: Significant other present at bedside updated -Advance care planning has been discussed.   Admission status:   Status is: Inpatient Remains inpatient appropriate because: Needing amiodarone drip/Cardizem drip for better rate control   Disposition: From  - home             Planning for discharge in 1 days: to HOME Procedures:   No admission procedures for hospital encounter.   Antimicrobials:  Anti-infectives (From admission, onward)    Start     Dose/Rate Route Frequency Ordered Stop   08/23/23 1800  cephALEXin (KEFLEX) capsule 500 mg        500 mg Oral Every 6 hours 08/23/23 1607 08/25/23 2359        Medication:   apixaban  5 mg Oral BID   atorvastatin  20 mg Oral Daily   cephALEXin  500 mg Oral Q6H   Chlorhexidine Gluconate Cloth  6 each Topical Q0600   diltiazem  240 mg Oral Daily   insulin aspart  0-6 Units Subcutaneous TID WC   lisinopril  20 mg Oral Daily   metoprolol tartrate  50 mg Oral BID   sodium chloride flush  3 mL Intravenous Q12H    acetaminophen **OR** acetaminophen, bisacodyl, hydrALAZINE, HYDROmorphone (DILAUDID) injection, ipratropium, levalbuterol, ondansetron **OR** ondansetron (ZOFRAN) IV, oxyCODONE, senna-docusate, sodium phosphate, traZODone   Objective:   Vitals:   08/24/23 2358 08/25/23 0629 08/25/23 0827 08/25/23 1147  BP: 130/66 134/63 135/69 138/81  Pulse: (!) 57 (!) 59 64   Resp: 16 16    Temp: (!) 97.5 F (36.4 C) 98.7 F (37.1 C)    TempSrc: Oral Oral    SpO2: 96% 98%    Weight:  99.8 kg    Height:        Intake/Output Summary (Last 24 hours) at 08/25/2023 1214 Last data filed at 08/25/2023  0600 Gross per 24 hour  Intake 240 ml  Output 300 ml  Net -60 ml   Filed Weights   08/23/23 1523 08/24/23 0500 08/25/23 0629  Weight: 100.1 kg 99.7 kg 99.8 kg     Physical examination:        General:  AAO x 3,  cooperative, no distress;   HEENT:  Normocephalic, PERRL, otherwise with in Normal limits   Neuro:  CNII-XII intact. , normal motor and sensation, reflexes intact   Lungs:   Clear to auscultation BL, Respirations unlabored,  No wheezes / crackles  Cardio:    S1/S2, RRR, No murmure, No Rubs or Gallops   Abdomen:  Soft,  non-tender, bowel sounds active all four quadrants, no guarding or peritoneal signs.  Muscular  skeletal:  Limited exam -global generalized weaknesses - in bed, able to move all 4 extremities,   2+ pulses,  symmetric, No pitting edema  Skin:  Dry, warm to touch, negative for any Rashes,  Wounds: Please see nursing documentation          ------------------------------------------------------------------------------------------------------------------------------------------    LABs:     Latest Ref Rng & Units 08/25/2023    5:02 AM 08/24/2023    5:29 AM 08/23/2023    7:30 AM  CBC  WBC 4.0 - 10.5 K/uL 9.4  9.1  10.1   Hemoglobin 12.0 - 15.0 g/dL 8.2  8.0  9.5   Hematocrit 36.0 - 46.0 % 28.4  28.7  32.7   Platelets 150 - 400 K/uL 270  257  325       Latest Ref Rng & Units 08/25/2023    5:02 AM 08/24/2023    5:29 AM 08/23/2023    7:30 AM  CMP  Glucose 70 - 99 mg/dL 161  096  045   BUN 6 - 20 mg/dL 21  17  13    Creatinine 0.44 - 1.00 mg/dL 4.09  8.11  9.14   Sodium 135 - 145 mmol/L 137  135  135   Potassium 3.5 - 5.1 mmol/L 3.9  3.6  3.3   Chloride 98 - 111 mmol/L 109  107  106   CO2 22 - 32 mmol/L 22  21  20    Calcium 8.9 - 10.3 mg/dL 8.5  8.5  8.6   Total Protein 6.5 - 8.1 g/dL   7.5   Total Bilirubin 0.3 - 1.2 mg/dL   0.4   Alkaline Phos 38 - 126 U/L   85   AST 15 - 41 U/L   18   ALT 0 - 44 U/L   17        Micro  Results Recent Results (from the past 240 hour(s))  MRSA Next Gen by PCR, Nasal     Status: None   Collection Time: 08/23/23  3:18 PM   Specimen: Nasal Mucosa; Nasal Swab  Result Value Ref Range Status   MRSA by PCR Next Gen NOT DETECTED NOT DETECTED Final    Comment: (NOTE) The GeneXpert MRSA Assay (FDA approved for NASAL specimens only), is one component of a comprehensive MRSA colonization surveillance program. It is not intended to diagnose MRSA infection nor to guide or monitor treatment for MRSA infections. Test performance is not FDA approved in patients less than 3 years old. Performed at [facility], [address] [place], [place] [address]     Radiology Reports ECHOCARDIOGRAM COMPLETE  Result Date: 08/25/2023    ECHOCARDIOGRAM REPORT   Patient Name:   ODESTER NILSON [facility] Date of Exam: 08/25/2023 Medical Rec #:  [id number]              Height:       70.0 in Accession #:    [id number]             Weight:       220.1 lb Date of Birth:  05-17-1971              BSA:          2.174 m Patient Age:    52 years               BP:  142/69 mmHg Patient Gender: F                      HR:           76 bpm. [facility] Procedure: 2D Echo, Cardiac Doppler and Color Doppler Indications:    Atrial Fibrillation I48.91  History:        Patient has no prior history of Echocardiogram examinations.                 Arrythmias:Atrial Flutter; Risk Factors:Hypertension and                 Diabetes.  Sonographer:    Celesta Gentile RCS Referring Phys: [phone] Caidyn Henricksen A Teagon Kron IMPRESSIONS  1. Left ventricular ejection fraction, by estimation, is 60 to 65%. The left ventricle has normal function. The left ventricle has no regional wall motion abnormalities. There is mild left ventricular hypertrophy. Left ventricular diastolic parameters are consistent with Grade II diastolic dysfunction (pseudonormalization).  2. Right ventricular systolic function is normal. The right ventricular size  is normal. Tricuspid regurgitation signal is inadequate for assessing PA pressure.  3. Left atrial size was severely dilated.  4. Right atrial size was mild to moderately dilated.  5. The mitral valve is normal in structure. Mild mitral valve regurgitation.  6. The aortic valve is tricuspid. Aortic valve regurgitation is not visualized.  7. There is mild dilatation of the aortic root, measuring 41 mm.  8. The inferior vena cava is dilated in size with >50% respiratory variability, suggesting right atrial pressure of 8 mmHg. Comparison(s): No prior Echocardiogram. FINDINGS  Left Ventricle: Left ventricular ejection fraction, by estimation, is 60 to 65%. The left ventricle has normal function. The left ventricle has no regional wall motion abnormalities. The left ventricular internal cavity size was normal in size. There is  mild left ventricular hypertrophy. Left ventricular diastolic parameters are consistent with Grade II diastolic dysfunction (pseudonormalization). Right Ventricle: The right ventricular size is normal. Right ventricular systolic function is normal. Tricuspid regurgitation signal is inadequate for assessing PA pressure. Left Atrium: Left atrial size was severely dilated. Right Atrium: Right atrial size was mild to moderately dilated. Pericardium: There is no evidence of pericardial effusion. Mitral Valve: The mitral valve is normal in structure. Mild mitral valve regurgitation. Tricuspid Valve: The tricuspid valve is normal in structure. Tricuspid valve regurgitation is not demonstrated. Aortic Valve: The aortic valve is tricuspid. Aortic valve regurgitation is not visualized. Pulmonic Valve: Pulmonic valve regurgitation is not visualized. Aorta: There is mild dilatation of the aortic root, measuring 41 mm. Venous: The inferior vena cava is dilated in size with greater than 50% respiratory variability, suggesting right atrial pressure of 8 mmHg. IAS/Shunts: No atrial level shunt detected by color  flow Doppler.  LEFT VENTRICLE PLAX 2D LVIDd:         5.00 cm   Diastology LVIDs:         3.60 cm   LV e' medial:    6.74 cm/s LV PW:         1.10 cm   LV E/e' medial:  15.6 LV IVS:        1.30 cm   LV e' lateral:   6.31 cm/s LVOT diam:     2.40 cm   LV E/e' lateral: 16.6 LV SV:         116 LV SV Index:   53 LVOT Area:     4.52 cm  RIGHT VENTRICLE RV S prime:     14.10 cm/s TAPSE (M-mode): 2.9 cm LEFT ATRIUM              Index        RIGHT ATRIUM           Index LA diam:        4.60 cm  2.12 cm/m   RA Area:     26.50 cm LA Vol (A2C):   115.0 ml 52.90 ml/m  RA Volume:   89.60 ml  41.21 ml/m LA Vol (A4C):   132.0 ml 60.72 ml/m LA Biplane Vol: 132.0 ml 60.72 ml/m  AORTIC VALVE LVOT Vmax:   110.00 cm/s LVOT Vmean:  72.200 cm/s LVOT VTI:    0.256 m  AORTA Ao Root diam: 4.10 cm MITRAL VALVE MV Area (PHT): 2.91 cm     SHUNTS MV Decel Time: 261 msec     Systemic VTI:  0.26 m MR Peak grad: 94.1 mmHg     Systemic Diam: 2.40 cm MR Mean grad: 65.0 mmHg MR Vmax:      485.00 cm/s MR Vmean:     376.0 cm/s MV E velocity: 105.00 cm/s MV A velocity: 52.70 cm/s MV E/A ratio:  1.99 Photographer signed by Carolan Clines Signature Date/Time: 08/25/2023/11:36:14 AM    Final     SIGNED: Kendell Bane, MD, FHM. FAAFP. [facility] - [facility] Critical care time spent - 55 min.  In seeing, evaluating and examining the patient. Reviewing medical records, labs, drawn plan of care. [facility],  Pager (please use [email or web address] to page/ text) Please use Epic Secure Chat for non-urgent communication (7AM-7PM)  If 7PM-7AM, please contact night-coverage [email or web address], 08/25/2023, 12:14 PM

## 2023-08-25 NOTE — Progress Notes (Signed)
Physical Therapy Screen/ Discharge Patient Details Name: Norma Carroll MRN: [id number] DOB: 04-Aug-1971 Today's Date: 08/25/2023 Time:  -    Entered pt's room @ [phone], dicussed with PT evaluation, screened pt for concerns with mobility. Per LPN, pt reportedly independent.   Pt reporting to PT, she is independent, no concerns with balance and falls. No concerns discharging home. Pt is at baseline. No acute PT services indicated at this time. PT to sign off on order.      Nelida Meuse 08/25/2023, 9:38 AM

## 2023-08-25 NOTE — Consult Note (Signed)
WOC Nurse Consult Note: patient states she dropped a box on her left ankle back in August, has seen primary care physician and urgent care and was placed on oral antibiotics  Reason for Consult: L ankle wound  Wound type: full thickness, traumatic  Pressure Injury POA: NA  Measurement: 2 cm x 2 cm 100% brown fibrinous tissue  Wound bed: as above  Drainage (amount, consistency, odor) dry  Periwound: intact  Dressing procedure/placement/frequency: Clean L lateral ankle wound with NS, apply Medihoney to wound bed daily, cover with dry gauze and cover with silicone foam or Kerlix roll gauze.   Patient is a diabetic with wound present for 2 months, discussed with her she may benefit from following with wound care center or ortho to ensure ongoing healing of this area.    POC discussed with patient, primary  nurse and MD.  WOC team will not follow.  Re-consult if further needs arise.   Thank you,    Priscella Mann MSN, RN-BC, [employer] [phone]

## 2023-08-25 NOTE — Assessment & Plan Note (Signed)
Resolved

## 2023-08-25 NOTE — Progress Notes (Signed)
*  PRELIMINARY RESULTS* Echocardiogram 2D Echocardiogram has been performed.  Stacey Drain 08/25/2023, 11:08 AM

## 2023-08-26 ENCOUNTER — Encounter (HOSPITAL_COMMUNITY): Payer: Self-pay | Admitting: Hematology and Oncology

## 2023-08-26 ENCOUNTER — Other Ambulatory Visit (HOSPITAL_COMMUNITY): Payer: Self-pay

## 2023-08-26 DIAGNOSIS — I4892 Unspecified atrial flutter: Secondary | ICD-10-CM | POA: Diagnosis not present

## 2023-08-26 LAB — CBC
HCT: 30.6 % — ABNORMAL LOW (ref 36.0–46.0)
Hemoglobin: 8.4 g/dL — ABNORMAL LOW (ref 12.0–15.0)
MCH: 20.5 pg — ABNORMAL LOW (ref 26.0–34.0)
MCHC: 27.5 g/dL — ABNORMAL LOW (ref 30.0–36.0)
MCV: 74.6 fL — ABNORMAL LOW (ref 80.0–100.0)
Platelets: 272 10*3/uL (ref 150–400)
RBC: 4.1 MIL/uL (ref 3.87–5.11)
RDW: 17.5 % — ABNORMAL HIGH (ref 11.5–15.5)
WBC: 10.9 10*3/uL — ABNORMAL HIGH (ref 4.0–10.5)
nRBC: 0 % (ref 0.0–0.2)

## 2023-08-26 LAB — BASIC METABOLIC PANEL
Anion gap: 7 (ref 5–15)
BUN: 18 mg/dL (ref 6–20)
CO2: 21 mmol/L — ABNORMAL LOW (ref 22–32)
Calcium: 8.3 mg/dL — ABNORMAL LOW (ref 8.9–10.3)
Chloride: 110 mmol/L (ref 98–111)
Creatinine, Ser: 0.68 mg/dL (ref 0.44–1.00)
GFR, Estimated: >60 mL/min (ref >60)
Glucose, Bld: 91 mg/dL (ref 70–99)
Potassium: 3.9 mmol/L (ref 3.5–5.1)
Sodium: 138 mmol/L (ref 135–145)

## 2023-08-26 LAB — GLUCOSE, CAPILLARY
Glucose-Capillary: 94 mg/dL (ref 70–99)
Glucose-Capillary: 167 mg/dL — ABNORMAL HIGH (ref 70–99)

## 2023-08-26 MED ORDER — AMIODARONE HCL 200 MG PO TABS: 200.0000 mg | ORAL_TABLET | Freq: Two times a day (BID) | ORAL | Status: DC

## 2023-08-26 MED ORDER — AMIODARONE HCL 200 MG PO TABS: ORAL_TABLET | ORAL | 1 refills | Status: DC

## 2023-08-26 MED ORDER — LISINOPRIL 40 MG PO TABS: 40.0000 mg | ORAL_TABLET | Freq: Every day | ORAL | 2 refills | Status: DC

## 2023-08-26 MED ORDER — APIXABAN 5 MG PO TABS: 5.0000 mg | ORAL_TABLET | Freq: Two times a day (BID) | ORAL | 3 refills | Status: DC

## 2023-08-26 MED ORDER — LISINOPRIL 10 MG PO TABS
40.0000 mg | ORAL_TABLET | Freq: Every day | ORAL | Status: DC
  Administered 2023-08-26: 40 mg via ORAL
  Filled 2023-08-26: qty 4

## 2023-08-26 MED ORDER — ATORVASTATIN CALCIUM 20 MG PO TABS: 20.0000 mg | ORAL_TABLET | Freq: Every day | ORAL | 1 refills | Status: DC

## 2023-08-26 MED ORDER — DILTIAZEM HCL ER COATED BEADS 120 MG PO CP24
120.0000 mg | ORAL_CAPSULE | Freq: Every day | ORAL | Status: DC
  Administered 2023-08-26: 120 mg via ORAL
  Filled 2023-08-26: qty 1

## 2023-08-26 MED ORDER — AMIODARONE HCL 200 MG PO TABS
200.0000 mg | ORAL_TABLET | Freq: Two times a day (BID) | ORAL | Status: DC
  Administered 2023-08-26: 200 mg via ORAL
  Filled 2023-08-26: qty 1

## 2023-08-26 MED ORDER — DILTIAZEM HCL ER COATED BEADS 120 MG PO CP24: 120.0000 mg | ORAL_CAPSULE | Freq: Every day | ORAL | 2 refills | Status: DC

## 2023-08-26 NOTE — Progress Notes (Signed)
Rounding Note    Patient Name: Norma Carroll Date of Encounter: 08/26/2023  [facility] Cardiologist: Luanna Cole  Subjective   No complaints  Inpatient Medications    Scheduled Meds:  apixaban  5 mg Oral BID   atorvastatin  20 mg Oral Daily   Chlorhexidine Gluconate Cloth  6 each Topical Q0600   diltiazem  180 mg Oral Daily   insulin aspart  0-6 Units Subcutaneous TID WC   leptospermum manuka honey  1 Application Topical Daily   lisinopril  20 mg Oral Daily   metoprolol tartrate  50 mg Oral BID   sodium chloride flush  3 mL Intravenous Q12H   Continuous Infusions:  PRN Meds: acetaminophen **OR** acetaminophen, bisacodyl, hydrALAZINE, HYDROmorphone (DILAUDID) injection, ipratropium, levalbuterol, ondansetron **OR** ondansetron (ZOFRAN) IV, oxyCODONE, senna-docusate, sodium phosphate, traZODone   Vital Signs    Vitals:   08/25/23 1147 08/25/23 1402 08/25/23 2022 08/26/23 0351  BP: 138/81 (!) 148/73 (!) 157/82 (!) 149/76  Pulse:  62 61 (!) 57  Resp:  18 20 18   Temp:  97.6 F (36.4 C) 98.3 F (36.8 C) 98.7 F (37.1 C)  TempSrc:   Oral Oral  SpO2:  97% 99% 99%  Weight:    97.7 kg  Height:       No intake or output data in the 24 hours ending 08/26/23 0826    08/26/2023    3:51 AM 08/25/2023    6:29 AM 08/24/2023    5:00 AM  Last 3 Weights  Weight (lbs) 215 lb 6.2 oz 220 lb 1.6 oz 219 lb 12.8 oz  Weight (kg) 97.7 kg 99.837 kg 99.7 kg      Telemetry    NSR - Personally Reviewed  ECG    N/a - Personally Reviewed  Physical Exam   GEN: No acute distress.   Neck: No JVD Cardiac: RRR, no murmurs, rubs, or gallops.  Respiratory: Clear to auscultation bilaterally. GI: Soft, nontender, non-distended  MS: No edema; No deformity. Neuro:  Nonfocal  Psych: Normal affect   Labs    High Sensitivity Troponin:   Recent Labs  Lab 08/23/23 0730 08/23/23 0942  TROPONINIHS 10 14     Chemistry Recent Labs  Lab 08/23/23 0730  08/23/23 1445 08/24/23 0529 08/25/23 0502 08/26/23 0432  NA 135  --  135 137 138  K 3.3*  --  3.6 3.9 3.9  CL 106  --  107 109 110  CO2 20*  --  21* 22 21*  GLUCOSE 105*  --  117* 109* 91  BUN 13  --  17 21* 18  CREATININE 0.65  --  0.71 0.73 0.68  CALCIUM 8.6*  --  8.5* 8.5* 8.3*  MG  --  2.3  --   --   --   PROT 7.5  --   --   --   --   ALBUMIN 4.1  --   --   --   --   AST 18  --   --   --   --   ALT 17  --   --   --   --   ALKPHOS 85  --   --   --   --   BILITOT 0.4  --   --   --   --   GFRNONAA >60  --  >60 >60 >60  ANIONGAP 9  --  7 6 7     Lipids No results for input(s): "CHOL", "TRIG", "  HDL", "LABVLDL", "LDLCALC", "CHOLHDL" in the last 168 hours.  Hematology Recent Labs  Lab 08/24/23 0529 08/25/23 0502 08/26/23 0432  WBC 9.1 9.4 10.9*  RBC 3.87 3.83* 4.10  HGB 8.0* 8.2* 8.4*  HCT 28.7* 28.4* 30.6*  MCV 74.2* 74.2* 74.6*  MCH 20.7* 21.4* 20.5*  MCHC 27.9* 28.9* 27.5*  RDW 17.2* 17.5* 17.5*  PLT 257 270 272   Thyroid  Recent Labs  Lab 08/23/23 0730  TSH 0.991  FREET4 0.94    BNPNo results for input(s): "BNP", "PROBNP" in the last 168 hours.  DDimer No results for input(s): "DDIMER" in the last 168 hours.   Radiology    ECHOCARDIOGRAM COMPLETE  Result Date: 08/25/2023    ECHOCARDIOGRAM REPORT   Patient Name:   Norma Carroll [facility] Date of Exam: 08/25/2023 Medical Rec #:  [id number]              Height:       70.0 in Accession #:    [id number]             Weight:       220.1 lb Date of Birth:  September 27, 1971              BSA:          2.174 m Patient Age:    52 years               BP:           142/69 mmHg Patient Gender: F                      HR:           76 bpm. [facility] Procedure: 2D Echo, Cardiac Doppler and Color Doppler Indications:    Atrial Fibrillation I48.91  History:        Patient has no prior history of Echocardiogram examinations.                 Arrythmias:Atrial Flutter; Risk Factors:Hypertension and                 Diabetes.   Sonographer:    Celesta Gentile RCS Referring Phys: [phone] SEYED A SHAHMEHDI IMPRESSIONS  1. Left ventricular ejection fraction, by estimation, is 60 to 65%. The left ventricle has normal function. The left ventricle has no regional wall motion abnormalities. There is mild left ventricular hypertrophy. Left ventricular diastolic parameters are consistent with Grade II diastolic dysfunction (pseudonormalization).  2. Right ventricular systolic function is normal. The right ventricular size is normal. Tricuspid regurgitation signal is inadequate for assessing PA pressure.  3. Left atrial size was severely dilated.  4. Right atrial size was mild to moderately dilated.  5. The mitral valve is normal in structure. Mild mitral valve regurgitation.  6. The aortic valve is tricuspid. Aortic valve regurgitation is not visualized.  7. There is mild dilatation of the aortic root, measuring 41 mm.  8. The inferior vena cava is dilated in size with >50% respiratory variability, suggesting right atrial pressure of 8 mmHg. Comparison(s): No prior Echocardiogram. FINDINGS  Left Ventricle: Left ventricular ejection fraction, by estimation, is 60 to 65%. The left ventricle has normal function. The left ventricle has no regional wall motion abnormalities. The left ventricular internal cavity size was normal in size. There is  mild left ventricular hypertrophy. Left ventricular diastolic parameters are consistent with Grade II diastolic dysfunction (pseudonormalization). Right Ventricle: The right ventricular size is normal.  Right ventricular systolic function is normal. Tricuspid regurgitation signal is inadequate for assessing PA pressure. Left Atrium: Left atrial size was severely dilated. Right Atrium: Right atrial size was mild to moderately dilated. Pericardium: There is no evidence of pericardial effusion. Mitral Valve: The mitral valve is normal in structure. Mild mitral valve regurgitation. Tricuspid Valve: The tricuspid valve  is normal in structure. Tricuspid valve regurgitation is not demonstrated. Aortic Valve: The aortic valve is tricuspid. Aortic valve regurgitation is not visualized. Pulmonic Valve: Pulmonic valve regurgitation is not visualized. Aorta: There is mild dilatation of the aortic root, measuring 41 mm. Venous: The inferior vena cava is dilated in size with greater than 50% respiratory variability, suggesting right atrial pressure of 8 mmHg. IAS/Shunts: No atrial level shunt detected by color flow Doppler.  LEFT VENTRICLE PLAX 2D LVIDd:         5.00 cm   Diastology LVIDs:         3.60 cm   LV e' medial:    6.74 cm/s LV PW:         1.10 cm   LV E/e' medial:  15.6 LV IVS:        1.30 cm   LV e' lateral:   6.31 cm/s LVOT diam:     2.40 cm   LV E/e' lateral: 16.6 LV SV:         116 LV SV Index:   53 LVOT Area:     4.52 cm  RIGHT VENTRICLE RV S prime:     14.10 cm/s TAPSE (M-mode): 2.9 cm LEFT ATRIUM              Index        RIGHT ATRIUM           Index LA diam:        4.60 cm  2.12 cm/m   RA Area:     26.50 cm LA Vol (A2C):   115.0 ml 52.90 ml/m  RA Volume:   89.60 ml  41.21 ml/m LA Vol (A4C):   132.0 ml 60.72 ml/m LA Biplane Vol: 132.0 ml 60.72 ml/m  AORTIC VALVE LVOT Vmax:   110.00 cm/s LVOT Vmean:  72.200 cm/s LVOT VTI:    0.256 m  AORTA Ao Root diam: 4.10 cm MITRAL VALVE MV Area (PHT): 2.91 cm     SHUNTS MV Decel Time: 261 msec     Systemic VTI:  0.26 m MR Peak grad: 94.1 mmHg     Systemic Diam: 2.40 cm MR Mean grad: 65.0 mmHg MR Vmax:      485.00 cm/s MR Vmean:     376.0 cm/s MV E velocity: 105.00 cm/s MV A velocity: 52.70 cm/s MV E/A ratio:  1.99 [place] signed by Carolan Clines Signature Date/Time: 08/25/2023/11:36:14 AM    Final     Cardiac Studies   08/2023 echo 1. Left ventricular ejection fraction, by estimation, is 60 to 65%. The  left ventricle has normal function. The left ventricle has no regional  wall motion abnormalities. There is mild left ventricular hypertrophy.  Left  ventricular diastolic parameters  are consistent with Grade II diastolic dysfunction (pseudonormalization).   2. Right ventricular systolic function is normal. The right ventricular  size is normal. Tricuspid regurgitation signal is inadequate for assessing  PA pressure.   3. Left atrial size was severely dilated.   4. Right atrial size was mild to moderately dilated.   5. The mitral valve is normal in structure. Mild mitral  valve  regurgitation.   6. The aortic valve is tricuspid. Aortic valve regurgitation is not  visualized.   7. There is mild dilatation of the aortic root, measuring 41 mm.   8. The inferior vena cava is dilated in size with >50% respiratory  variability, suggesting right atrial pressure of 8 mmHg.     Patient Profile     52 y.o. female female history of HTN admitted with palpitations, found to be in new onset aflutter.   Assessment & Plan    1.Atrial flutter, new onset - new diagnosis of aflutter this admission, presented with RVR - initially on dilt gtt and amio gtt. Converted to SR, has been transitioned to oral dilt 180mg  daily. Initially on oral amio but discontinued by primary team due to low HRs. Would restart oral 200mg  bid, lower diltiazem to 120mg  daily. Can d/c metoprolol.  Severe LAE, high risk recurrent aflutter. Plan for amio 200mg  bid x 3 weeks, then 200mg  daily.  - would f/u with EP outpatient consider long term options other than amiodarone given young age.   - started on oral eliquis 5mg  bid, CHADS2Vasc score is 2 (age, gender). With this score essentially anticoag would be shared decision making as opposed to strong recommendation. Chemically cardioverted with amiodarone this admisson, possible if near term recurrence may require electrical cardioversion. Continue anticoag at this time.   -echo LVEF 60-65%, grade II dd, normal RV function, severe LAE LAVI 61   2.Anemia - chronic anemia, monitor while on anticoag - further workup per primary  team  3.HTN - elevated bp's, increase lisinopril to 40mg  daily.   Ok for discharge, we will arrange outpatient f/u. We will sign off inpatient care.   For questions or updates, please contact [facility] Please consult [email or web address] for contact info under        Signed, Dina Rich, MD  08/26/2023, 8:26 AM

## 2023-08-26 NOTE — Discharge Summary (Signed)
Physician Discharge Summary   Patient: Norma Carroll MRN: [id number] DOB: 07-13-1971  Admit date:     08/23/2023  Discharge date: 08/26/23  Discharge Physician: Kendell Bane   PCP: Erasmo Downer, NP   Recommendations at discharge:   Follow-up PCP and cardiology -for further medication modification as it may be needed -New medications: Diltiazem to 120mg  daily.  Amiodarone 200mg  bid x 3 weeks, then 200mg  daily.  Eliquis 5 mg p.o. twice daily - would f/u with EP outpatient consider long term options other than amiodarone given young age.  Follow-up with cardiology in 2 -4 weeks Follow-up with PCP in 1-2 weeks   Discharge Diagnoses: Principal Problem:   Atrial flutter with rapid ventricular response (HCC) Active Problems:   HTN (hypertension)   Ankle ulcer, left, with unspecified severity (HCC)   Type 2 diabetes mellitus (HCC)   Chronic anemia   Iron deficiency anemia   Atrial flutter (HCC)  Resolved Problems:   * No resolved hospital problems. *  Hospital Course: Norma Carroll is a 52 year old female with history of HTN, DM II, iron deficiency anemia, B12 deficiency.  Presented to ED with chief complaint of chest pain and palpitation.  She reported that this morning she woke up with the chest pain and noticed palpitation.  And had some shortness of breath.  She described the pain as substernal mid chest area, radiating to her jaw.  Felt that her heart was racing but this been going on for past 2 weeks which resolved spontaneously.  Episode of palpitation was 2 to 3 days ago and was not associated with chest pain. For she has no diagnosis of arrhythmia. Denies of having any recent illnesses no change in medication or diet.   ED valuation/course: Blood pressure (!) 139/98, pulse (!) 137, temperature (!) 97.4 F (36.3 C),  RR 17, SpO2 97%.  Hemoglobin 9.5, potassium 3.3, troponin 14, TSH 0.99, free T40.94, Chest x-ray-clear, EKG-reviewed  Upon  arrival heart rate was 140s and flutter..  Did not respond to initial treatment with amiodarone bolus and Cardizem.  Cardiologist was consulted, patient started on amiodarone and Cardizem drip, Also heparin drip.   Requested patient to be admitted for rate control.   * Atrial flutter with rapid ventricular response (HCC) -Transfer out of ICU, off Cardizem and amiodarone drip Heart rate as low as 57 overnight -Off both drip Cardizem/amiodarone since 08/24/2023 -Has been on p.o. Cardizem and amiodarone  Amiodarone to 200 mg p.o. >>  D/Ced 08/25/2023 due to heart rate 57 -PO Cardizem 3 times daily >> changing to Cardizem CD 240 mg p.o. daily today 08/25/2023    Per cardiology Dr. Wyline Mood recommendations: -Discontinue metoprolol -New medications: Diltiazem to 120mg  daily.  Amiodarone 200mg  bid x 3 weeks, then 200mg  daily.  - would f/u with EP outpatient consider long term options other than amiodarone given young age.   - started on oral eliquis 5mg  bid, CHADS2Vasc score is 2 (age, gender).  -Discontinuing heparin drip,   -2D echocardiogram >> EJF 60 to 65%.  Mild LVH  Grade II diastolic dysfunction - Right ventricular systolic function is normal.   -Recycling cardiac enzymes: Troponin 10, 14,   Ankle ulcer, left, with unspecified severity (HCC) - Healing on antibiotics as an outpatient amoxicillin will be continued  HTN (hypertension) -- Hypertensive: Continue lisinopril,, Cardizem   Atrial flutter (HCC) Resolved  Iron deficiency anemia - Monitoring CBC, stable  Chronic anemia Monitoring H&H, chronic, stable  Type 2 diabetes mellitus (HCC) -  Home medication metformin will be held -Will check her CBG q. ACHS, SSI coverage -Diabetic/cardiac diet - A1c: 7.4    Consultants: Cardiologist Procedures performed: Heparin drip, IV Cardizem, IV amiodarone Disposition: Home Diet recommendation:  Discharge Diet Orders (From admission, onward)     Start     Ordered    08/26/23 0000  Diet - low sodium heart healthy        08/26/23 1026           Cardiac and Carb modified diet DISCHARGE MEDICATION: Allergies as of 08/26/2023       Reactions   Empagliflozin Other (See Comments)   Stomach pain        Medication List     STOP taking these medications    ibuprofen 200 MG tablet Commonly known as: ADVIL       TAKE these medications    amiodarone 200 MG tablet Commonly known as: PACERONE Take 1 tablet (200 mg total) by mouth 2 (two) times daily for 21 days, THEN 1 tablet (200 mg total) daily. Start taking on: August 26, 2023   apixaban 5 MG Tabs tablet Commonly known as: ELIQUIS Take 1 tablet (5 mg total) by mouth 2 (two) times daily.   atorvastatin 20 MG tablet Commonly known as: LIPITOR Take 1 tablet (20 mg total) by mouth daily.   cephALEXin 500 MG capsule Commonly known as: KEFLEX Take 500 mg by mouth 4 (four) times daily.   diltiazem 120 MG 24 hr capsule Commonly known as: CARDIZEM CD Take 1 capsule (120 mg total) by mouth daily. Start taking on: August 27, 2023   lisinopril 40 MG tablet Commonly known as: ZESTRIL Take 1 tablet (40 mg total) by mouth daily. Start taking on: August 27, 2023 What changed:  medication strength how much to take   metFORMIN 1000 MG tablet Commonly known as: GLUCOPHAGE Take 1,000 mg by mouth 2 (two) times daily.   Ozempic (1 MG/DOSE) 4 MG/3ML Sopn Generic drug: Semaglutide (1 MG/DOSE) Inject 1 mg into the skin once a week.               Discharge Care Instructions  (From admission, onward)           Start     Ordered   08/26/23 0000  Discharge wound care:       Comments: Continue wound care per recommendation wound care team (Clean left lateral ankle wound with normal saline, apply Medhoney to wound bed daily, cover with gauze and cover with silicone foam or Kerlix roll gauze.   08/26/23 1026            Discharge Exam: Filed Weights   08/24/23 0500  08/25/23 0629 08/26/23 0351  Weight: 99.7 kg 99.8 kg 97.7 kg        General:  AAO x 3,  cooperative, no distress;   HEENT:  Normocephalic, PERRL, otherwise with in Normal limits   Neuro:  CNII-XII intact. , normal motor and sensation, reflexes intact   Lungs:   Clear to auscultation BL, Respirations unlabored,  No wheezes / crackles  Cardio:    S1/S2, RRR, No murmure, No Rubs or Gallops   Abdomen:  Soft, non-tender, bowel sounds active all four quadrants, no guarding or peritoneal signs.  Muscular  skeletal:  Limited exam -global generalized weaknesses - in bed, able to move all 4 extremities,   2+ pulses,  symmetric, No pitting edema  Skin:  Dry, warm to touch, negative for any Rashes,  Wounds: Please see nursing documentation          Condition at discharge: good  The results of significant diagnostics from this hospitalization (including imaging, microbiology, ancillary and laboratory) are listed below for reference.   Imaging Studies: ECHOCARDIOGRAM COMPLETE  Result Date: 08/25/2023    ECHOCARDIOGRAM REPORT   Patient Name:   Norma Carroll [facility] Date of Exam: 08/25/2023 Medical Rec #:  [id number]              Height:       70.0 in Accession #:    [id number]             Weight:       220.1 lb Date of Birth:  28-Apr-1971              BSA:          2.174 m Patient Age:    52 years               BP:           142/69 mmHg Patient Gender: F                      HR:           76 bpm. [facility] Procedure: 2D Echo, Cardiac Doppler and Color Doppler Indications:    Atrial Fibrillation I48.91  History:        Patient has no prior history of Echocardiogram examinations.                 Arrythmias:Atrial Flutter; Risk Factors:Hypertension and                 Diabetes.  Sonographer:    Celesta Gentile RCS Referring Phys: [phone] Onnie Alatorre A Jaisha Villacres IMPRESSIONS  1. Left ventricular ejection fraction, by estimation, is 60 to 65%. The left ventricle has normal function. The left  ventricle has no regional wall motion abnormalities. There is mild left ventricular hypertrophy. Left ventricular diastolic parameters are consistent with Grade II diastolic dysfunction (pseudonormalization).  2. Right ventricular systolic function is normal. The right ventricular size is normal. Tricuspid regurgitation signal is inadequate for assessing PA pressure.  3. Left atrial size was severely dilated.  4. Right atrial size was mild to moderately dilated.  5. The mitral valve is normal in structure. Mild mitral valve regurgitation.  6. The aortic valve is tricuspid. Aortic valve regurgitation is not visualized.  7. There is mild dilatation of the aortic root, measuring 41 mm.  8. The inferior vena cava is dilated in size with >50% respiratory variability, suggesting right atrial pressure of 8 mmHg. Comparison(s): No prior Echocardiogram. FINDINGS  Left Ventricle: Left ventricular ejection fraction, by estimation, is 60 to 65%. The left ventricle has normal function. The left ventricle has no regional wall motion abnormalities. The left ventricular internal cavity size was normal in size. There is  mild left ventricular hypertrophy. Left ventricular diastolic parameters are consistent with Grade II diastolic dysfunction (pseudonormalization). Right Ventricle: The right ventricular size is normal. Right ventricular systolic function is normal. Tricuspid regurgitation signal is inadequate for assessing PA pressure. Left Atrium: Left atrial size was severely dilated. Right Atrium: Right atrial size was mild to moderately dilated. Pericardium: There is no evidence of pericardial effusion. Mitral Valve: The mitral valve is normal in structure. Mild mitral valve regurgitation. Tricuspid Valve: The tricuspid valve is normal in structure. Tricuspid valve regurgitation is not demonstrated. Aortic Valve: The  aortic valve is tricuspid. Aortic valve regurgitation is not visualized. Pulmonic Valve: Pulmonic valve  regurgitation is not visualized. Aorta: There is mild dilatation of the aortic root, measuring 41 mm. Venous: The inferior vena cava is dilated in size with greater than 50% respiratory variability, suggesting right atrial pressure of 8 mmHg. IAS/Shunts: No atrial level shunt detected by color flow Doppler.  LEFT VENTRICLE PLAX 2D LVIDd:         5.00 cm   Diastology LVIDs:         3.60 cm   LV e' medial:    6.74 cm/s LV PW:         1.10 cm   LV E/e' medial:  15.6 LV IVS:        1.30 cm   LV e' lateral:   6.31 cm/s LVOT diam:     2.40 cm   LV E/e' lateral: 16.6 LV SV:         116 LV SV Index:   53 LVOT Area:     4.52 cm  RIGHT VENTRICLE RV S prime:     14.10 cm/s TAPSE (M-mode): 2.9 cm LEFT ATRIUM              Index        RIGHT ATRIUM           Index LA diam:        4.60 cm  2.12 cm/m   RA Area:     26.50 cm LA Vol (A2C):   115.0 ml 52.90 ml/m  RA Volume:   89.60 ml  41.21 ml/m LA Vol (A4C):   132.0 ml 60.72 ml/m LA Biplane Vol: 132.0 ml 60.72 ml/m  AORTIC VALVE LVOT Vmax:   110.00 cm/s LVOT Vmean:  72.200 cm/s LVOT VTI:    0.256 m  AORTA Ao Root diam: 4.10 cm MITRAL VALVE MV Area (PHT): 2.91 cm     SHUNTS MV Decel Time: 261 msec     Systemic VTI:  0.26 m MR Peak grad: 94.1 mmHg     Systemic Diam: 2.40 cm MR Mean grad: 65.0 mmHg MR Vmax:      485.00 cm/s MR Vmean:     376.0 cm/s MV E velocity: 105.00 cm/s MV A velocity: 52.70 cm/s MV E/A ratio:  1.99 Photographer signed by Carolan Clines Signature Date/Time: 08/25/2023/11:36:14 AM    Final    DG Chest Port 1 View  Result Date: 08/23/2023 CLINICAL DATA:  Palpitations, chest pain, and shortness of breath EXAM: PORTABLE CHEST 1 VIEW COMPARISON:  Chest radiograph dated 04/01/2019 FINDINGS: Normal lung volumes. No focal consolidations. No pleural effusion or pneumothorax. Mildly enlarged cardiomediastinal silhouette. No acute osseous abnormality. IMPRESSION: 1.  No focal consolidations. 2. Mildly enlarged cardiomediastinal silhouette.  Electronically Signed   By: Agustin Cree M.D.   On: 08/23/2023 09:27    Microbiology: Results for orders placed or performed during the hospital encounter of 08/23/23  MRSA Next Gen by PCR, Nasal     Status: None   Collection Time: 08/23/23  3:18 PM   Specimen: Nasal Mucosa; Nasal Swab  Result Value Ref Range Status   MRSA by PCR Next Gen NOT DETECTED NOT DETECTED Final    Comment: (NOTE) The GeneXpert MRSA Assay (FDA approved for NASAL specimens only), is one component of a comprehensive MRSA colonization surveillance program. It is not intended to diagnose MRSA infection nor to guide or monitor treatment for MRSA infections. Test performance is not FDA approved  in patients less than 61 years old. Performed at [facility], [address], [place], [place] [address]     Labs: CBC: Recent Labs  Lab 08/23/23 0730 08/24/23 0529 08/25/23 0502 08/26/23 0432  WBC 10.1 9.1 9.4 10.9*  NEUTROABS 7.3  --   --   --   HGB 9.5* 8.0* 8.2* 8.4*  HCT 32.7* 28.7* 28.4* 30.6*  MCV 72.0* 74.2* 74.2* 74.6*  PLT 325 257 270 272   Basic Metabolic Panel: Recent Labs  Lab 08/23/23 0730 08/23/23 1445 08/24/23 0529 08/25/23 0502 08/26/23 0432  NA 135  --  135 137 138  K 3.3*  --  3.6 3.9 3.9  CL 106  --  107 109 110  CO2 20*  --  21* 22 21*  GLUCOSE 105*  --  117* 109* 91  BUN 13  --  17 21* 18  CREATININE 0.65  --  0.71 0.73 0.68  CALCIUM 8.6*  --  8.5* 8.5* 8.3*  MG  --  2.3  --   --   --   PHOS  --  2.7  --   --   --    Liver Function Tests: Recent Labs  Lab 08/23/23 0730  AST 18  ALT 17  ALKPHOS 85  BILITOT 0.4  PROT 7.5  ALBUMIN 4.1   CBG: Recent Labs  Lab 08/25/23 0807 08/25/23 1138 08/25/23 1625 08/25/23 2024 08/26/23 0741  GLUCAP 110* 107* 209* 96 94    Discharge time spent: greater than 40 minutes.  Signed: Kendell Bane, MD [facility] 08/26/2023

## 2023-08-26 NOTE — Plan of Care (Signed)

## 2023-09-18 ENCOUNTER — Encounter: Payer: Self-pay | Admitting: Student

## 2023-09-18 ENCOUNTER — Ambulatory Visit: Payer: Medicaid Other | Attending: Student | Admitting: Student

## 2023-09-18 VITALS — BP 152/78 | HR 63 | Ht 70.0 in | Wt 228.0 lb

## 2023-09-18 DIAGNOSIS — I4892 Unspecified atrial flutter: Secondary | ICD-10-CM

## 2023-09-18 DIAGNOSIS — L97329 Non-pressure chronic ulcer of left ankle with unspecified severity: Secondary | ICD-10-CM

## 2023-09-18 DIAGNOSIS — D508 Other iron deficiency anemias: Secondary | ICD-10-CM | POA: Diagnosis not present

## 2023-09-18 DIAGNOSIS — I10 Essential (primary) hypertension: Secondary | ICD-10-CM | POA: Diagnosis not present

## 2023-09-18 MED ORDER — DILTIAZEM HCL ER COATED BEADS 180 MG PO CP24: 180.0000 mg | ORAL_CAPSULE | Freq: Every day | ORAL | 3 refills | Status: DC

## 2023-09-18 MED ORDER — APIXABAN 5 MG PO TABS: 5.0000 mg | ORAL_TABLET | Freq: Two times a day (BID) | ORAL | 3 refills | Status: DC

## 2023-09-18 MED ORDER — LISINOPRIL 40 MG PO TABS: 40.0000 mg | ORAL_TABLET | Freq: Every day | ORAL | 2 refills | Status: AC

## 2023-09-18 MED ORDER — AMIODARONE HCL 200 MG PO TABS: 200.0000 mg | ORAL_TABLET | Freq: Every day | ORAL | 0 refills | Status: DC

## 2023-09-18 NOTE — Patient Instructions (Signed)
Medication Instructions:   Decrease Amiodarone to 200 mg Daily  Start Cardizem 180 mg Daily  Start Slow release Iron ( Slow Fe) Daily   *If you need a refill on your cardiac medications before your next appointment, please call your pharmacy*   Lab Work: NONE   If you have labs (blood work) drawn today and your tests are completely normal, you will receive your results only by: MyChart Message (if you have MyChart) OR A paper copy in the mail If you have any lab test that is abnormal or we need to change your treatment, we will call you to review the results.   Testing/Procedures: NONE   Follow-Up: At [facility], you and your health needs are our priority.  As part of our continuing mission to provide you with exceptional heart care, we have created designated Provider Care Teams.  These Care Teams include your primary Cardiologist (physician) and Advanced Practice Providers (APPs -  Physician Assistants and Nurse Practitioners) who all work together to provide you with the care you need, when you need it.  We recommend signing up for the patient portal called "MyChart".  Sign up information is provided on this After Visit Summary.  MyChart is used to connect with patients for Virtual Visits (Telemedicine).  Patients are able to view lab/test results, encounter notes, upcoming appointments, etc.  Non-urgent messages can be sent to your provider as well.   To learn more about what you can do with MyChart, go to [email or web address].    Your next appointment:   3 - 4 month(s)  Provider:   Luane School, MD or Randall An, PA-C    Other Instructions Thank you for choosing [facility]!

## 2023-09-18 NOTE — Progress Notes (Signed)
Cardiology Office Note    Date:  09/18/2023  ID:  Norma Carroll, DOB 04/22/1971, MRN [id number] Cardiologist: Marjo Bicker, MD    History of Present Illness:    Norma Carroll is a 52 y.o. female with past medical history of HTN, Type 2 DM and recent diagnosis of atrial flutter with RVR (during admission in 08/2023) who presents to the office today for hospital follow-up.   She was recently admitted to [facility] from 10/11 - 08/26/2023 for evaluation of new onset palpitations which had awoke her from sleep. Upon arrival, she was found to be in new onset atrial flutter with RVR with heart rate in the 140's. K+ was slightly low at 3.3 but TSH was normal. She was started on IV Cardizem but rates remained poorly controlled, therefore was started on IV Amiodarone and Heparin as well. She did convert back to normal sinus rhythm on IV Amiodarone and was transitioned to oral Amiodarone 200 mg twice daily for 3 weeks followed by 200 mg daily. Was started on Eliquis 5 mg twice daily for anticoagulation but did have anemia with hemoglobin 8.4 at the time of discharge. Was recommended to refer to EP as an outpatient to consider long-term antiarrhythmic options since Amiodarone was not ideal long-term given her young age.  In talking the patient today, she reports overall doing well since her recent hospitalization. Reports she had experienced intermittent palpitations over the past several years but symptoms were never as severe and persistent as the symptoms she experienced the day of admission. Reports having occasional palpitations since discharge but symptoms resolve within a few minutes. Reports her breathing has been stable and denies any specific dyspnea on exertion, orthopnea, PND or exertional chest pain. She has been dealing with a wound along her left ankle for over 2 months and has been on antibiotics twice with slight improvement but then recurrent symptoms.  She has known  anemia and reports this has been present since she underwent gastric bypass in 2007. Was previously intolerant to iron supplementation due to GI issues. Unsure if she tried slow release iron at that time.  Denies any recent melena, hematochezia or hematuria.  Studies Reviewed:   EKG: EKG is ordered today and demonstrates:   EKG Interpretation Date/Time:  Wednesday September 18 2023 15:20:13 EST Ventricular Rate:  63 PR Interval:  122 QRS Duration:  146 QT Interval:  446 QTC Calculation: 456 R Axis:   228  Text Interpretation: Normal sinus rhythm Right bundle branch block Confirmed by Randall An ([id number]) on 09/18/2023 3:34:45 PM       Echocardiogram: 08/2023 IMPRESSIONS     1. Left ventricular ejection fraction, by estimation, is 60 to 65%. The  left ventricle has normal function. The left ventricle has no regional  wall motion abnormalities. There is mild left ventricular hypertrophy.  Left ventricular diastolic parameters  are consistent with Grade II diastolic dysfunction (pseudonormalization).   2. Right ventricular systolic function is normal. The right ventricular  size is normal. Tricuspid regurgitation signal is inadequate for assessing  PA pressure.   3. Left atrial size was severely dilated.   4. Right atrial size was mild to moderately dilated.   5. The mitral valve is normal in structure. Mild mitral valve  regurgitation.   6. The aortic valve is tricuspid. Aortic valve regurgitation is not  visualized.   7. There is mild dilatation of the aortic root, measuring 41 mm.   8. The inferior vena cava is  dilated in size with >50% respiratory  variability, suggesting right atrial pressure of 8 mmHg.   Comparison(s): No prior Echocardiogram.    Risk Assessment/Calculations:    CHA2DS2-VASc Score = 2   This indicates a 2.2% annual risk of stroke. The patient's score is based upon: CHF History: 0 HTN History: 1 Diabetes History: 0 Stroke History:  0 Vascular Disease History: 0 Age Score: 0 Gender Score: 1     HYPERTENSION CONTROL Vitals:   09/18/23 1515 09/18/23 1517 09/18/23 1558  BP: (!) 170/82 (!) 174/82 (!) 152/78    The patient's blood pressure is elevated above target today.  In order to address the patient's elevated BP: A current anti-hypertensive medication was adjusted today.            Physical Exam:   VS:  BP (!) 152/78   Pulse 63   Ht 5\' 10"  (1.778 m)   Wt 228 lb (103.4 kg)   SpO2 99%   BMI 32.71 kg/m    Wt Readings from Last 3 Encounters:  09/18/23 228 lb (103.4 kg)  08/26/23 215 lb 6.2 oz (97.7 kg)  02/20/21 218 lb 12.8 oz (99.2 kg)     GEN: Well nourished, well developed female appearing in no acute distress NECK: No JVD; No carotid bruits CARDIAC: RRR, no murmurs, rubs, gallops RESPIRATORY:  Clear to auscultation without rales, wheezing or rhonchi  ABDOMEN: Appears non-distended. No obvious abdominal masses. EXTREMITIES: No clubbing or cyanosis. No edema.  Distal pedal pulses are 2+ bilaterally.   Assessment and Plan:   1. Atrial Flutter with RVR - This was a new diagnosis for the patient during admission and she did convert to normal sinus rhythm while on IV Amiodarone. She has been taking 200 mg twice daily and we reviewed that she can reduce this to 200 mg daily. She does have previously scheduled follow-up with EP later this month to discuss alternative antiarrhythmic options given her young age or to consider ablation.   - Given intermittent breakthrough palpitations and due to her elevated BP, will titrate Cardizem CD from 120 mg daily to 180 mg daily. - No reports of active bleeding. Continue Eliquis 5 mg twice daily for anticoagulation which is the appropriate dose at this time given her age, weight and renal function.  2. HTN - Blood pressure was initially recorded at 174/82, rechecked and at 152/78. She is currently taking Cardizem CD 120 mg daily and Lisinopril 40 mg daily. Will  titrate Cardizem CD to 180 mg daily to help with breakthrough palpitations and BP control.  Will titrate gradually given her heart rate in the 60's today.  3. Anemia - She has a history of iron deficiency anemia following gastric bypass. Hemoglobin was at 8.4 on 08/26/2023. She has not been on iron supplementation in several years and we reviewed that she could try slow release iron. If intolerant to this, would review with her PCP as she may be a candidate for IV supplementation.  4. Lower Extremity Wound - She does have an ulcer along her left ankle and has experienced delayed healing for over 2 months. Previously prescribed antibiotics by her PCP with minimal improvement in symptoms. Will refer to the [facility]ound [facility] for additional evaluation.    Signed, Ellsworth Lennox, PA-C 09/18/2023, 8:36 PM

## 2023-09-24 ENCOUNTER — Ambulatory Visit (HOSPITAL_COMMUNITY): Payer: Medicaid Other | Admitting: Physical Therapy

## 2023-09-27 ENCOUNTER — Ambulatory Visit (HOSPITAL_COMMUNITY): Payer: Medicaid Other | Admitting: Physical Therapy

## 2023-10-01 ENCOUNTER — Ambulatory Visit (HOSPITAL_COMMUNITY): Payer: Medicaid Other | Admitting: Physical Therapy

## 2023-10-03 ENCOUNTER — Encounter: Payer: Self-pay | Admitting: Internal Medicine

## 2023-10-03 ENCOUNTER — Ambulatory Visit: Payer: Medicaid Other | Attending: Internal Medicine | Admitting: Internal Medicine

## 2023-10-03 VITALS — BP 148/72 | HR 65 | Ht 70.0 in | Wt 218.0 lb

## 2023-10-03 DIAGNOSIS — I4892 Unspecified atrial flutter: Secondary | ICD-10-CM

## 2023-10-03 NOTE — Patient Instructions (Addendum)
Medication Instructions:  Your physician recommends that you continue on your current medications as directed. Please refer to the Current Medication list given to you today.  Take your Eliquis the night before and none the morning of your procedure.   Do not take Ozempic 1 week prior to your procedure.   *If you need a refill on your cardiac medications before your next appointment, please call your pharmacy*   Lab Work: Your physician recommends that you return for lab work the first week in January   If you have labs (blood work) drawn today and your tests are completely normal, you will receive your results only by: [employer] (if you have MyChart) OR A paper copy in the mail If you have any lab test that is abnormal or we need to change your treatment, we will call you to review the results.   Testing/Procedures: Your physician has recommended that you have an ablation. Catheter ablation is a medical procedure used to treat some cardiac arrhythmias (irregular heartbeats). During catheter ablation, a long, thin, flexible tube is put into a blood vessel in your groin (upper thigh), or neck. This tube is called an ablation catheter. It is then guided to your heart through the blood vessel. Radio frequency waves destroy small areas of heart tissue where abnormal heartbeats may cause an arrhythmia to start. Please see the instruction sheet given to you today.    Follow-Up: At [facility], you and your health needs are our priority.  As part of our continuing mission to provide you with exceptional heart care, we have created designated Provider Care Teams.  These Care Teams include your primary Cardiologist (physician) and Advanced Practice Providers (APPs -  Physician Assistants and Nurse Practitioners) who all work together to provide you with the care you need, when you need it.  We recommend signing up for the patient portal called "MyChart".  Sign up information is  provided on this After Visit Summary.  MyChart is used to connect with patients for Virtual Visits (Telemedicine).  Patients are able to view lab/test results, encounter notes, upcoming appointments, etc.  Non-urgent messages can be sent to your provider as well.   To learn more about what you can do with MyChart, go to [email or web address].    Your next appointment:   4 week(s) after Ablation   Provider:   [name], MD    Other Instructions Thank you for choosing [facility]!        Electrophysiology/Ablation Procedure Instructions   Norma Carroll  10/03/2023  You are scheduled for SVT Ablation on Thursday, January 16 with Dr. [name].  1. Pre procedure Lab testing:  [facility]    2. Please arrive at the Main Entrance A at [facility]: [address] [place], [place] [address] on January 16 at 11:30 AM (This time is two hours before your procedure to ensure your preparation). Free valet parking service is available. You will check in at ADMITTING. The support person will be asked to wait in the waiting room.  It is OK to have someone drop you off and come back when you are ready to be discharged.        Special note: Every effort is made to have your procedure done on time. Please understand that emergencies sometimes delay scheduled procedures.    3.  Do not eat or drink after midnight prior to your procedure.     4.  On the  morning of your procedure Do NOT take any medication.        !!IF ANY NEW MEDICATIONS ARE STARTED AFTER TODAY, PLEASE NOTIFY YOUR PROVIDER AS SOON AS POSSIBLE!!  FYI: Medications such as Semaglutide (Ozempic, [place]), Tirzepatide (Mounjaro, Zepbound), Dulaglutide (Trulicity), etc ("GLP1 agonists") AND Canagliflozin (Invokana), Dapagliflozin (Farxiga), Empagliflozin (Jardiance), Ertugliflozin (Steglatro), Bexagliflozin [employer]) or any combination with one of these drugs such as Invokamet  (Canagliflozin/Metformin), Synjardy (Empagliflozin/Metformin), etc ("SGLT2 inhibitors") must be held around the time of a procedure. This is not a comprehensive list of all of these drugs. Please review all of your medications and talk to your provider if you take any one of these. If you are not sure, ask your provider.  5.  If you take a blood thinner do not miss any doses prior to the morning of your procedure or your procedure will need to be rescheduled.   6.   Plan to go home the same day, you will only stay overnight if medically necessary. 7.   You MUST have a responsible adult to drive you home. 8.   An adult MUST be with you the first 24 hours after you arrive home. 9    Bring a current list of your medications, and the last time and date medication taken. 10  Bring ID and current insurance cards. 11. Please wear clothes that are easy to get on and off and wear slip-on shoes.   Follow-up: You will follow up with Dr.[name] 4 weeks after your procedure.  * If you have ANY questions after you get home, please call the office [phone] and ask for EP RN or send a MyChart message.  FYI: For your safety, and to allow [place] to monitor your vital signs accurately during the surgery/procedure we request that if you have artificial nails, gel coating, SNS etc. Please have those removed prior to your surgery/procedure. Not having the nail coverings /polish removed may result in cancellation or delay of your surgery/procedure.

## 2023-10-03 NOTE — Progress Notes (Signed)
HPI Norma Carroll is referred by Cheron Every for evaluation of atrial flutter. She is a pleasant 52 yo woman with a h/o obesity, DM, and HTN. She presented with a RBB tachy over a month ago. She was treated with IV adenosine. Her rhythm was described as atrial flutter. The patient has never had syncope. She has been on amiodarone since and has not had any more additional symptoms since taking the amiodarone. She has been treated with eliquis and has not had any bleeding.  Allergies  Allergen Reactions   Empagliflozin Other (See Comments)    Stomach pain     Current Outpatient Medications  Medication Sig Dispense Refill   amiodarone (PACERONE) 200 MG tablet Take 1 tablet (200 mg total) by mouth daily. 90 tablet 0   apixaban (ELIQUIS) 5 MG TABS tablet Take 1 tablet (5 mg total) by mouth 2 (two) times daily. 180 tablet 3   atorvastatin (LIPITOR) 20 MG tablet Take 1 tablet (20 mg total) by mouth daily. 30 tablet 1   diltiazem (CARDIZEM CD) 180 MG 24 hr capsule Take 1 capsule (180 mg total) by mouth daily. 90 capsule 3   lisinopril (ZESTRIL) 40 MG tablet Take 1 tablet (40 mg total) by mouth daily. 90 tablet 2   metFORMIN (GLUCOPHAGE) 1000 MG tablet Take 1,000 mg by mouth 2 (two) times daily.     OZEMPIC, 1 MG/DOSE, 4 MG/3ML SOPN Inject 1 mg into the skin once a week.     No current facility-administered medications for this visit.     Past Medical History:  Diagnosis Date   Anemia    Blood transfusion without reported diagnosis 2007   after gastric bypass surgery   Diabetes mellitus without complication (HCC) 2003   Hypertension 2008    ROS:   All systems reviewed and negative except as noted in the HPI.   Past Surgical History:  Procedure Laterality Date   CESAREAN SECTION  [id number] and 1997   LAPAROSCOPIC APPENDECTOMY N/A 04/01/2019   Procedure: APPENDECTOMY LAPAROSCOPIC;  Surgeon: Franky Macho, MD;  Location: [facility];  Service: General;  Laterality: N/A;   LAPAROSCOPIC  GASTRIC BYPASS  2007     Family History  Problem Relation Age of Onset   Diabetes Mother    Heart attack Father        X 3; 47 STENTS   Hypertension Father    Diabetes Father    Prostate cancer Father 20       mets     Social History   Socioeconomic History   Marital status: Married    Spouse name: Not on file   Number of children: Not on file   Years of education: Not on file   Highest education level: Not on file  Occupational History   Not on file  Tobacco Use   Smoking status: Never   Smokeless tobacco: Never  Vaping Use   Vaping status: Never Used  Substance and Sexual Activity   Alcohol use: No   Drug use: No   Sexual activity: Yes  Other Topics Concern   Not on file  Social History Narrative   Not on file   Social Determinants of Health   Financial Resource Strain: Low Risk  (02/20/2021)   Overall Financial Resource Strain (CARDIA)    Difficulty of Paying Living Expenses: Not hard at all  Food Insecurity: No Food Insecurity (08/23/2023)   Hunger Vital Sign    Worried About Radiation protection practitioner of Food  in the Last Year: Never true    Ran Out of Food in the Last Year: Never true  Transportation Needs: No Transportation Needs (08/23/2023)   PRAPARE - Administrator, Civil Service (Medical): No    Lack of Transportation (Non-Medical): No  Physical Activity: Inactive (02/20/2021)   Exercise Vital Sign    Days of Exercise per Week: 0 days    Minutes of Exercise per Session: 0 min  Stress: No Stress Concern Present (02/20/2021)   [employer] of Occupational Health - Occupational Stress Questionnaire    Feeling of Stress : Only a little  Social Connections: Socially Isolated (02/20/2021)   Social Connection and Isolation Panel [NHANES]    Frequency of Communication with Friends and Family: Never    Frequency of Social Gatherings with Friends and Family: Never    Attends Religious Services: Never    Database administrator or Organizations: No     Attends Banker Meetings: Never    Marital Status: Married  Catering manager Violence: Not At Risk (08/23/2023)   Humiliation, Afraid, Rape, and Kick questionnaire    Fear of Current or Ex-Partner: No    Emotionally Abused: No    Physically Abused: No    Sexually Abused: No     BP (!) 148/72   Pulse 65   Ht 5\' 10"  (1.778 m)   Wt 218 lb (98.9 kg)   SpO2 98%   BMI 31.28 kg/m   Physical Exam:  Well appearing NAD HEENT: Unremarkable Neck:  No JVD, no thyromegally Lymphatics:  No adenopathy Back:  No CVA tenderness Lungs:  Clear HEART:  Regular rate rhythm, no murmurs, no rubs, no clicks Abd:  soft, positive bowel sounds, no organomegally, no rebound, no guarding Ext:  2 plus pulses, no edema, no cyanosis, no clubbing Skin:  No rashes no nodules Neuro:  CN II through XII intact, motor grossly intact  Assess/Plan: SVT/Atrial flutter - I have reviewed her ECG's and I am not convinced that the patient has atrial flutter. She would like to come off of her blood thinner if possible. I have offered her EP study and catheter ablation and she would like to proceed. I have discussed the risks/benefits of the procedure with the patient.   Norma Gowda Alix Stowers,MD

## 2023-10-04 ENCOUNTER — Ambulatory Visit (HOSPITAL_COMMUNITY): Payer: Medicaid Other | Admitting: Physical Therapy

## 2023-10-07 ENCOUNTER — Ambulatory Visit (HOSPITAL_COMMUNITY): Payer: Medicaid Other | Admitting: Physical Therapy

## 2023-10-09 ENCOUNTER — Ambulatory Visit (HOSPITAL_COMMUNITY): Payer: Medicaid Other

## 2023-10-14 ENCOUNTER — Ambulatory Visit (HOSPITAL_COMMUNITY): Payer: Medicaid Other | Admitting: Physical Therapy

## 2023-10-17 ENCOUNTER — Ambulatory Visit (HOSPITAL_COMMUNITY): Payer: Medicaid Other | Admitting: Physical Therapy

## 2023-10-21 ENCOUNTER — Ambulatory Visit (HOSPITAL_COMMUNITY): Payer: Medicaid Other | Admitting: Physical Therapy

## 2023-10-24 ENCOUNTER — Ambulatory Visit (HOSPITAL_COMMUNITY): Payer: Medicaid Other

## 2023-10-28 ENCOUNTER — Ambulatory Visit (HOSPITAL_COMMUNITY): Payer: Medicaid Other | Admitting: Physical Therapy

## 2023-10-31 ENCOUNTER — Ambulatory Visit (HOSPITAL_COMMUNITY): Payer: Medicaid Other | Admitting: Physical Therapy

## 2023-11-01 ENCOUNTER — Encounter: Payer: Medicaid Other | Attending: Physician Assistant | Admitting: Physician Assistant

## 2023-11-01 DIAGNOSIS — L97322 Non-pressure chronic ulcer of left ankle with fat layer exposed: Secondary | ICD-10-CM | POA: Diagnosis not present

## 2023-11-01 DIAGNOSIS — I10 Essential (primary) hypertension: Secondary | ICD-10-CM | POA: Insufficient documentation

## 2023-11-01 DIAGNOSIS — Z7901 Long term (current) use of anticoagulants: Secondary | ICD-10-CM | POA: Insufficient documentation

## 2023-11-01 DIAGNOSIS — I4892 Unspecified atrial flutter: Secondary | ICD-10-CM | POA: Diagnosis not present

## 2023-11-01 DIAGNOSIS — E11622 Type 2 diabetes mellitus with other skin ulcer: Secondary | ICD-10-CM | POA: Diagnosis present

## 2023-11-02 NOTE — Progress Notes (Signed)
NADIJA, DHALIWAL ([id number]) [id number] Page 1 of 9 Visit Report for 11/01/2023 Allergy List Details Patient Name: Date of Service: ADWOA, PRESTWOOD [place] J. 11/01/2023 8:45 A M Medical Record Number: [id number] Patient Account Number: [id number] Date of Birth/Sex: Treating RN: 14-Nov-1970 (52 y.o. Freddy Finner Primary Care Deepak Bless: Cheron Every Other Clinician: Referring Pellegrino Kennard: Treating Quanetta Truss/Extender: Allen Derry Self, Referral Weeks in Treatment: 0 Allergies Active Allergies No Known Allergies Allergy Notes Electronic Signature(s) Signed: 11/01/2023 11:21:11 AM By: Yevonne Pax RN Entered By: Yevonne Pax on 11/01/2023 06:02:54 -------------------------------------------------------------------------------- Arrival Information Details Patient Name: Date of Service: Karie Chimera NA J. 11/01/2023 8:45 A M Medical Record Number: [id number] Patient Account Number: [id number] Date of Birth/Sex: Treating RN: 02-18-1971 (52 y.o. Freddy Finner Primary Care Rane Blitch: Cheron Every Other Clinician: Referring Miciah Covelli: Treating Nora Sabey/Extender: Allen Derry Self, Referral Weeks in Treatment: 0 Visit Information Patient Arrived: Ambulatory Arrival Time: 09:00 Accompanied By: self Transfer Assistance: None Patient Identification Verified: Yes Secondary Verification Process Completed: Yes Patient Requires Transmission-Based Precautions: No Patient Has Alerts: Yes Patient Alerts: Patient on Blood Thinner Diabetic NON COMPRESSABLE Electronic Signature(s) Signed: 11/01/2023 11:21:11 AM By: Yevonne Pax RN [place], Signed: 11/01/2023 11:21:11 AM By: Yevonne Pax RN Katheran James ([id number]) [id number] Page 2 of 9 Entered By: Yevonne Pax on 11/01/2023 06:15:39 -------------------------------------------------------------------------------- Clinic Level of Care Assessment Details Patient Name: Date of Service: RACHELLE, MORR [place]  11/01/2023 8:45 A M Medical Record Number: [id number] Patient Account Number: [id number] Date of Birth/Sex: Treating RN: 1971-03-03 (52 y.o. Freddy Finner Primary Care Anikin Prosser: Cheron Every Other Clinician: Referring Arshad Oberholzer: Treating Kanon Colunga/Extender: Allen Derry Self, Referral Weeks in Treatment: 0 Clinic Level of Care Assessment Items TOOL 1 Quantity Score X- 1 0 Use when EandM and Procedure is performed on INITIAL visit ASSESSMENTS - Nursing Assessment / Reassessment X- 1 20 General Physical Exam (combine w/ comprehensive assessment (listed just below) when performed on new pt. evals) X- 1 25 Comprehensive Assessment (HX, ROS, Risk Assessments, Wounds Hx, etc.) ASSESSMENTS - Wound and Skin Assessment / Reassessment []  - 0 Dermatologic / Skin Assessment (not related to wound area) ASSESSMENTS - Ostomy and/or Continence Assessment and Care []  - 0 Incontinence Assessment and Management []  - 0 Ostomy Care Assessment and Management (repouching, etc.) PROCESS - Coordination of Care X - Simple Patient / Family Education for ongoing care 1 15 []  - 0 Complex (extensive) Patient / Family Education for ongoing care []  - 0 Staff obtains Chiropractor, Records, T Results / Process Orders est []  - 0 Staff telephones HHA, Nursing Homes / Clarify orders / etc []  - 0 Routine Transfer to another Facility (non-emergent condition) []  - 0 Routine Hospital Admission (non-emergent condition) X- 1 15 New Admissions / Manufacturing engineer / Ordering NPWT Apligraf, etc. , []  - 0 Emergency Hospital Admission (emergent condition) PROCESS - Special Needs []  - 0 Pediatric / Minor Patient Management []  - 0 Isolation Patient Management []  - 0 Hearing / Language / Visual special needs []  - 0 Assessment of Community assistance (transportation, D/C planning, etc.) []  - 0 Additional assistance / Altered mentation []  - 0 Support Surface(s) Assessment (bed, cushion, seat,  etc.) INTERVENTIONS - Miscellaneous []  - 0 External ear exam []  - 0 Patient Transfer (multiple staff / Nurse, adult / Similar devices) []  - 0 Simple Staple / Suture removal (25 or less) CALEB, KELSH ([id number]) [id number] Page 3 of 9 []  - 0 Complex Staple / Suture removal (26 or more) []  - 0 Hypo/Hyperglycemic Management (do not check  if billed separately) X- 1 15 Ankle / Brachial Index (ABI) - do not check if billed separately Has the patient been seen at the hospital within the last three years: Yes Total Score: 90 Level Of Care: New/Established - Level 3 Electronic Signature(s) Signed: 11/01/2023 11:21:11 AM By: Yevonne Pax RN Entered By: Yevonne Pax on 11/01/2023 06:47:03 -------------------------------------------------------------------------------- Encounter Discharge Information Details Patient Name: Date of Service: Karie Chimera NA J. 11/01/2023 8:45 A M Medical Record Number: [id number] Patient Account Number: [id number] Date of Birth/Sex: Treating RN: August 07, 1971 (52 y.o. Freddy Finner Primary Care Blas Riches: Cheron Every Other Clinician: Referring Hurley Sobel: Treating Genice Kimberlin/Extender: Allen Derry Self, Referral Weeks in Treatment: 0 Encounter Discharge Information Items Post Procedure Vitals Discharge Condition: Stable Temperature (F): 97.9 Ambulatory Status: Ambulatory Pulse (bpm): 57 Discharge Destination: Home Respiratory Rate (breaths/min): 18 Transportation: Private Auto Blood Pressure (mmHg): 184/92 Accompanied By: SELF Schedule Follow-up Appointment: Yes Clinical Summary of Care: Electronic Signature(s) Signed: 11/01/2023 11:21:11 AM By: Yevonne Pax RN Entered By: Yevonne Pax on 11/01/2023 06:49:05 -------------------------------------------------------------------------------- Lower Extremity Assessment Details Patient Name: Date of Service: IDONA, ALESSANDRO NA J. 11/01/2023 8:45 A M Medical Record Number:  [id number] Patient Account Number: [id number] Date of Birth/Sex: Treating RN: [birth year] (52 y.o. Freddy Finner Primary Care Annise Boran: Cheron Every Other Clinician: Referring Tashena Ibach: Treating Vaughn Frieze/Extender: Allen Derry Self, Referral Weeks in Treatment: 0 Edema Assessment Assessed: [Left: No] [Right: No] M[LeftSHAWNIQUE, TILLISON ([id number])] [Right: [id number] Page 4 of 9] Edema: [Left: N] [Right: o] Calf Left: Right: Point of Measurement: 38 cm From Medial Instep 40 cm Ankle Left: Right: Point of Measurement: 12 cm From Medial Instep 24 cm Knee To Floor Left: Right: From Medial Instep 48 cm Vascular Assessment Pulses: Dorsalis Pedis Palpable: [Left:Yes] Doppler Audible: [Left:Yes] Extremity colors, hair growth, and conditions: Extremity Color: [Left:Normal] Hair Growth on Extremity: [Left:No] Temperature of Extremity: [Left:Warm] Capillary Refill: [Left:< 3 seconds] Dependent Rubor: [Left:No] Blanched when Elevated: [Left:No No] Toe Nail Assessment Left: Right: Thick: No Discolored: No Deformed: No Improper Length and Hygiene: No Notes NON COMPRESSABLE Electronic Signature(s) Signed: 11/01/2023 11:21:11 AM By: Yevonne Pax RN Entered By: Yevonne Pax on 11/01/2023 06:16:36 -------------------------------------------------------------------------------- Multi Wound Chart Details Patient Name: Date of Service: Karie Chimera NA J. 11/01/2023 8:45 A M Medical Record Number: [id number] Patient Account Number: [id number] Date of Birth/Sex: Treating RN: [birth year] (52 y.o. Freddy Finner Primary Care Fatimah Sundquist: Cheron Every Other Clinician: Referring Tanita Palinkas: Treating Keandria Berrocal/Extender: Allen Derry Self, Referral Weeks in Treatment: 0 Vital Signs Height(in): 70 Pulse(bpm): 57 Weight(lbs): 210 Blood Pressure(mmHg): 184/92 Body Mass Index(BMI): 30.1 Temperature(F): 97.9 Respiratory Rate(breaths/min): [address], [place] J  ([id number]) [id number] Page 5 of 9 [1:Photos:] [N/A:N/A] Left, Lateral Ankle N/A N/A Wound Location: Trauma N/A N/A Wounding Event: Diabetic Wound/Ulcer of the Lower N/A N/A Primary Etiology: Extremity Arrhythmia, Hypertension, Type II N/A N/A Comorbid History: Diabetes 07/01/2023 N/A N/A Date Acquired: 0 N/A N/A Weeks of Treatment: Open N/A N/A Wound Status: No N/A N/A Wound Recurrence: 2.5x2.5x0.2 N/A N/A Measurements L x W x D (cm) 4.909 N/A N/A A (cm) : rea 0.982 N/A N/A Volume (cm) : Grade 1 N/A N/A Classification: Medium N/A N/A Exudate A mount: Serosanguineous N/A N/A Exudate Type: red, brown N/A N/A Exudate Color: Medium (34-66%) N/A N/A Granulation A mount: Red N/A N/A Granulation Quality: Medium (34-66%) N/A N/A Necrotic A mount: Fat Layer (Subcutaneous Tissue): Yes N/A N/A Exposed Structures: Fascia: No Tendon: No Muscle: No Joint: No Bone: No None N/A N/A Epithelialization: Treatment Notes Electronic Signature(s)  Signed: 11/01/2023 11:21:11 AM By: Yevonne Pax RN Entered By: Yevonne Pax on 11/01/2023 06:16:47 -------------------------------------------------------------------------------- Multi-Disciplinary Care Plan Details Patient Name: Date of Service: Karie Chimera NA J. 11/01/2023 8:45 A M Medical Record Number: [id number] Patient Account Number: [id number] Date of Birth/Sex: Treating RN: [birth year] (52 y.o. Freddy Finner Primary Care Rito Lecomte: Cheron Every Other Clinician: Referring Selby Foisy: Treating Wells Mabe/Extender: Allen Derry Self, Referral Weeks in Treatment: 0 Active Inactive Necrotic Tissue Nursing Diagnoses: Knowledge deficit related to management of necrotic/devitalized tissue LAKIETHA, WIGFIELD ([id number]) [phone] Page 6 of 9 Goals: Necrotic/devitalized tissue will be minimized in the wound bed Date Initiated: 11/01/2023 Target Resolution Date:  12/02/2023 Goal Status: Active Interventions: Assess patient pain level pre-, during and post procedure and prior to discharge Notes: Wound/Skin Impairment Nursing Diagnoses: Knowledge deficit related to ulceration/compromised skin integrity Goals: Patient/caregiver will verbalize understanding of skin care regimen Date Initiated: 11/01/2023 Target Resolution Date: 12/02/2023 Goal Status: Active Ulcer/skin breakdown will have a volume reduction of 30% by week 4 Date Initiated: 11/01/2023 Target Resolution Date: 12/02/2023 Goal Status: Active Ulcer/skin breakdown will have a volume reduction of 50% by week 8 Date Initiated: 11/01/2023 Target Resolution Date: 01/02/2024 Goal Status: Active Ulcer/skin breakdown will have a volume reduction of 80% by week 12 Date Initiated: 11/01/2023 Target Resolution Date: 01/30/2024 Goal Status: Active Ulcer/skin breakdown will heal within 14 weeks Date Initiated: 11/01/2023 Target Resolution Date: 03/01/2024 Goal Status: Active Interventions: Assess patient/caregiver ability to obtain necessary supplies Assess patient/caregiver ability to perform ulcer/skin care regimen upon admission and as needed Assess ulceration(s) every visit Notes: Electronic Signature(s) Signed: 11/01/2023 11:21:11 AM By: Yevonne Pax RN Entered By: Yevonne Pax on 11/01/2023 06:18:38 -------------------------------------------------------------------------------- Pain Assessment Details Patient Name: Date of Service: Karie Chimera NA J. 11/01/2023 8:45 A M Medical Record Number: [id number] Patient Account Number: [id number] Date of Birth/Sex: Treating RN: 08-25-1971 (52 y.o. Freddy Finner Primary Care Alliene Klugh: Cheron Every Other Clinician: Referring Donya Tomaro: Treating Marvin Grabill/Extender: Allen Derry Self, Referral Weeks in Treatment: 0 Active Problems Location of Pain Severity and Description of Pain Patient Has Paino Yes Site Locations With Dressing ChangeMAYELY, SPROLES ([id number]) [id number] Page 7 of 9 Duration of the Pain. Constant / Intermittento Constant Rate the pain. Current Pain Level: 3 Worst Pain Level: 8 Least Pain Level: 1 Tolerable Pain Level: 5 Character of Pain Describe the Pain: Aching, Burning, Throbbing Pain Management and Medication Current Pain Management: Medication: Yes Cold Application: No Rest: Yes Massage: No Activity: No T.E.N.S.: No Heat Application: No Leg drop or elevation: No Is the Current Pain Management Adequate: Inadequate How does your wound impact your activities of daily livingo Sleep: Yes Bathing: No Appetite: No Relationship With Others: No Bladder Continence: No Emotions: No Bowel Continence: No Work: No Toileting: No Drive: No Dressing: No Hobbies: No Electronic Signature(s) Signed: 11/01/2023 11:21:11 AM By: Yevonne Pax RN Entered By: Yevonne Pax on 11/01/2023 06:02:03 -------------------------------------------------------------------------------- Patient/Caregiver Education Details Patient Name: Date of Service: Karie Chimera NA J. 12/20/2024andnbsp8:45 A M Medical Record Number: [id number] Patient Account Number: [id number] Date of Birth/Gender: Treating RN: 02-Mar-1971 (52 y.o. Freddy Finner Primary Care Physician: Cheron Every Other Clinician: Referring Physician: Treating Physician/Extender: Allen Derry Self, Referral Weeks in Treatment: 0 Education Assessment Education Provided To: Patient Education Topics Provided Wound/Skin Impairment: Handouts: Caring for Your Ulcer Methods: Explain/Verbal SIDNEE, LOOSE ([id number]) [phone].pdf Page 8 of 9 Responses: State content correctly Electronic Signature(s) Signed: 11/01/2023 11:21:11 AM By: Yevonne Pax RN Entered By: Yevonne Pax on 11/01/2023 06:18:50 --------------------------------------------------------------------------------  Wound Assessment  Details Patient Name: Date of Service: YUMA, SPAYDE 11/01/2023 8:45 A M Medical Record Number: [id number] Patient Account Number: [id number] Date of Birth/Sex: Treating RN: July 01, 1971 (52 y.o. Freddy Finner Primary Care Mackayla Mullins: Cheron Every Other Clinician: Referring Rylei Masella: Treating Debraann Livingstone/Extender: Allen Derry Self, Referral Weeks in Treatment: 0 Wound Status Wound Number: 1 Primary Etiology: Diabetic Wound/Ulcer of the Lower Extremity Wound Location: Left, Lateral Ankle Wound Status: Open Wounding Event: Trauma Comorbid History: Arrhythmia, Hypertension, Type II Diabetes Date Acquired: 07/01/2023 Weeks Of Treatment: 0 Clustered Wound: No Photos Wound Measurements Length: (cm) 2.5 Width: (cm) 2.5 Depth: (cm) 0.2 Area: (cm) 4.909 Volume: (cm) 0.982 % Reduction in Area: % Reduction in Volume: Epithelialization: None Tunneling: No Undermining: No Wound Description Classification: Grade 1 Exudate Amount: Medium Exudate Type: Serosanguineous Exudate Color: red, brown Foul Odor After Cleansing: No Slough/Fibrino Yes Wound Bed Granulation Amount: Medium (34-66%) Exposed Structure Granulation Quality: Red Fascia Exposed: No Necrotic Amount: Medium (34-66%) Fat Layer (Subcutaneous Tissue) Exposed: Yes Necrotic Quality: Adherent Slough Tendon Exposed: No Muscle Exposed: No Joint Exposed: No Bone Exposed: No YANITZA, TOWNSHEND ([id number]) [id number] Page 9 of 9 Treatment Notes Wound #1 (Ankle) Wound Laterality: Left, Lateral Cleanser Soap and Water Discharge Instruction: Gently cleanse wound with antibacterial soap, rinse and pat dry prior to dressing wounds Peri-Wound Care Topical Primary Dressing Hydrofera Blue Ready Transfer Foam, 2.5x2.5 (in/in) Discharge Instruction: Apply Hydrofera Blue Ready to wound bed as directed Secondary Dressing (BORDER) Zetuvit Plus SILICONE BORDER Dressing 5x5 (in/in) Discharge Instruction:  Please do not put silicone bordered dressings under wraps. Use non-bordered dressing only. Secured With Compression Wrap Compression Stockings Facilities manager) Signed: 11/01/2023 11:21:11 AM By: Yevonne Pax RN Entered By: Yevonne Pax on 11/01/2023 06:14:55 -------------------------------------------------------------------------------- Vitals Details Patient Name: Date of Service: Eulah Pont, [name] 11/01/2023 8:45 A M Medical Record Number: [id number] Patient Account Number: [id number] Date of Birth/Sex: Treating RN: [birth year] (52 y.o. Freddy Finner Primary Care Ladarrion Telfair: Cheron Every Other Clinician: Referring Hashim Eichhorst: Treating Niamya Vittitow/Extender: Allen Derry Self, Referral Weeks in Treatment: 0 Vital Signs Time Taken: 09:00 Temperature (F): 97.9 Height (in): 70 Pulse (bpm): 57 Source: Stated Respiratory Rate (breaths/min): 18 Weight (lbs): 210 Blood Pressure (mmHg): 184/92 Source: Stated Reference Range: 80 - 120 mg / dl Body Mass Index (BMI): 30.1 Electronic Signature(s) Signed: 11/01/2023 11:21:11 AM By: Yevonne Pax RN Entered By: Yevonne Pax on 11/01/2023 06:02:42

## 2023-11-02 NOTE — Progress Notes (Signed)
Norma, [name] ([id number]) [phone] [id number] Page 1 of 5 Visit Report for 11/01/2023 Abuse Risk Screen Details Patient Name: Date of Service: Norma Carroll, Norma Carroll 11/01/2023 8:45 A M Medical Record Number: [id number] Patient Account Number: [id number] Date of Birth/Sex: Treating RN: 04-28-1971 (52 y.o. Norma Carroll Primary Care Norma Carroll: Norma Carroll: Referring Norma Carroll: Treating Norma Carroll: Norma Carroll Self, Referral Weeks in Treatment: 0 Abuse Risk Screen Items Answer ABUSE RISK SCREEN: Has anyone close to you tried to hurt or harm you recentlyo No Do you feel uncomfortable with anyone in your familyo No Has anyone forced you do things that you didnt want to doo No Electronic Signature(s) Signed: 11/01/2023 11:21:11 AM By: Yevonne Pax RN Entered By: Yevonne Pax on 11/01/2023 06:05:35 -------------------------------------------------------------------------------- Activities of Daily Living Details Patient Name: Date of Service: Norma Carroll, Norma Carroll 11/01/2023 8:45 A M Medical Record Number: [id number] Patient Account Number: [id number] Date of Birth/Sex: Treating RN: 1971/08/11 (52 y.o. Norma Carroll Primary Care Norma Carroll: Norma Carroll: Referring Norma Carroll: Treating Norma Carroll/Extender: Norma Carroll Self, Referral Weeks in Treatment: 0 Activities of Daily Living Items Answer Activities of Daily Living (Please select one for each item) Drive Automobile Completely Able T Medications ake Completely Able Use T elephone Completely Able Care for Appearance Completely Able Use T oilet Completely Able Bath / Shower Completely Able Dress Self Completely Able Feed Self Completely Able Walk Completely Able Get In / Out Bed Completely Able Housework Completely Norma Carroll, Norma Carroll ([id number]) [phone] [id number] Page 2 of 5 Prepare Meals Completely Able Handle Money Completely  Able Shop for Self Completely Able Electronic Signature(s) Signed: 11/01/2023 11:21:11 AM By: Yevonne Pax RN Entered By: Yevonne Pax on 11/01/2023 06:05:56 -------------------------------------------------------------------------------- Education Screening Details Patient Name: Date of Service: Norma Carroll, Norma NA Carroll. 11/01/2023 8:45 A M Medical Record Number: [id number] Patient Account Number: [id number] Date of Birth/Sex: Treating RN: 10-16-1971 (52 y.o. Norma Carroll Primary Care Norma Carroll: Norma Carroll: Referring Norma Carroll: Treating Norma Carroll/Extender: Norma Carroll Self, Referral Weeks in Treatment: 0 Primary Learner Assessed: Patient Learning Preferences/Education Level/Primary Language Learning Preference: Explanation Highest Education Level: High School Preferred Language: English Cognitive Barrier Language Barrier: No Translator Needed: No Memory Deficit: No Emotional Barrier: No Cultural/Religious Beliefs Affecting Medical Care: No Physical Barrier Impaired Vision: No Impaired Hearing: No Decreased Hand dexterity: No Knowledge/Comprehension Knowledge Level: Medium Comprehension Level: High Ability to understand written instructions: High Ability to understand verbal instructions: High Motivation Anxiety Level: Anxious Cooperation: Cooperative Education Importance: Acknowledges Need Interest in Health Problems: Asks Questions Perception: Coherent Willingness to Engage in Self-Management High Activities: Readiness to Engage in Self-Management High Activities: Electronic Signature(s) Signed: 11/01/2023 11:21:11 AM By: Yevonne Pax RN Entered By: Yevonne Pax on 11/01/2023 06:06:18 Norma Carroll ([id number]) [id number] Page 3 of 5 -------------------------------------------------------------------------------- Fall Risk Assessment Details Patient Name: Date of Service: Norma Carroll, Norma [place] Carroll. 11/01/2023 8:45 A  M Medical Record Number: [id number] Patient Account Number: [id number] Date of Birth/Sex: Treating RN: [birth year] (51 y.o. Norma Carroll Primary Care Norma Carroll: Norma Carroll: Referring Norma Carroll: Treating Norma Carroll/Extender: Norma Carroll Self, Referral Weeks in Treatment: 0 Fall Risk Assessment Items Have you had 2 or more falls in the last 12 monthso 0 No Have you had any fall that resulted in injury in the last 12 monthso 0 No FALLS RISK SCREEN History of falling - immediate or within 3 months 0 No Secondary diagnosis (Do you have 2 or more medical diagnoseso) 0 No Ambulatory aid None/bed rest/wheelchair/nurse 0  Yes Crutches/cane/walker 0 No Furniture 0 No Intravenous therapy Access/Saline/Heparin Lock 0 No Gait/Transferring Normal/ bed rest/ wheelchair 0 Yes Weak (short steps with or without shuffle, stooped but able to lift head while walking, may seek 0 No support from furniture) Impaired (short steps with shuffle, may have difficulty arising from chair, head down, impaired 0 No balance) Mental Status Oriented to own ability 0 Yes Electronic Signature(s) Signed: 11/01/2023 11:21:11 AM By: Yevonne Pax RN Entered By: Yevonne Pax on 11/01/2023 06:06:26 -------------------------------------------------------------------------------- Foot Assessment Details Patient Name: Date of Service: Norma Carroll, Norma NA Carroll. 11/01/2023 8:45 A M Medical Record Number: [id number] Patient Account Number: [id number] Date of Birth/Sex: Treating RN: June 19, 1971 (52 y.o. Norma Carroll Primary Care Norma Carroll: Norma Carroll: Referring Norma Carroll: Treating Norma Carroll/Extender: Norma Carroll Self, Referral Weeks in Treatment: 0 Foot Assessment Items Site Locations Norma Carroll, Norma Carroll ([id number]) [phone] [id number] Page 4 of 5 + = Sensation present, - = Sensation absent, C = Callus, U = Ulcer R = Redness, W = Warmth, M = Maceration, PU =  Pre-ulcerative lesion F = Fissure, S = Swelling, D = Dryness Assessment Right: Left: Other Deformity: No No Prior Foot Ulcer: No No Prior Amputation: No No Charcot Joint: No No Ambulatory Status: Ambulatory Without Help Gait: Steady Electronic Signature(s) Signed: 11/01/2023 11:21:11 AM By: Yevonne Pax RN Entered By: Yevonne Pax on 11/01/2023 06:13:14 -------------------------------------------------------------------------------- Nutrition Risk Screening Details Patient Name: Date of Service: Norma Carroll, Norma NA Carroll. 11/01/2023 8:45 A M Medical Record Number: [id number] Patient Account Number: [id number] Date of Birth/Sex: Treating RN: 21-Feb-1971 (52 y.o. Norma Carroll Primary Care Breindel Collier: Norma Carroll: Referring Careena Degraffenreid: Treating Jancie Kercher/Extender: Norma Carroll Self, Referral Weeks in Treatment: 0 Height (in): 70 Weight (lbs): 210 Body Mass Index (BMI): 30.1 Nutrition Risk Screening Items Score Screening NUTRITION RISK SCREEN: I have an illness or condition that made me change the kind and/or amount of food I eat 0 No I eat fewer than two meals per day 0 No I eat few fruits and vegetables, or milk products 0 No I have three or more drinks of beer, liquor or wine almost every day 0 No I have tooth or mouth problems that make it hard for me to eat 0 No I don't always have enough money to buy the food I need 0 No Norma Carroll, Norma Carroll ([id number]) [id number] Page 5 of 5 I eat alone most of the time 0 No I take three or more different prescribed or over-the-counter drugs a day 1 Yes Without wanting to, I have lost or gained 10 pounds in the last six months 0 No I am not always physically able to shop, cook and/or feed myself 0 No Nutrition Protocols Good Risk Protocol 0 No interventions needed Moderate Risk Protocol High Risk Proctocol Risk Level: Good Risk Score: 1 Electronic Signature(s) Signed: 11/01/2023 11:21:11 AM By:  Yevonne Pax RN Entered By: Yevonne Pax on 11/01/2023 06:06:40

## 2023-11-02 NOTE — Progress Notes (Signed)
Norma Carroll, Norma Carroll ([id number]) [id number] Page 1 of 9 Visit Report for 11/01/2023 Chief Complaint Document Details Patient Name: Date of Service: Norma Carroll, Norma Carroll [place] J. 11/01/2023 8:45 A M Medical Record Number: [id number] Patient Account Number: [id number] Date of Birth/Sex: Treating RN: 01/10/1971 (52 y.o. Freddy Finner Primary Care Provider: Cheron Every Other Clinician: Referring Provider: Treating Provider/Extender: Allen Derry Self, Referral Weeks in Treatment: 0 Information Obtained from: Patient Chief Complaint Left lateral ankle ulcer Electronic Signature(s) Signed: 11/01/2023 9:37:07 AM By: Allen Derry PA-C Entered By: Allen Derry on 11/01/2023 06:37:07 -------------------------------------------------------------------------------- Debridement Details Patient Name: Date of Service: Norma Carroll, Norma NA J. 11/01/2023 8:45 A M Medical Record Number: [id number] Patient Account Number: [id number] Date of Birth/Sex: Treating RN: October 18, 1971 (52 y.o. Freddy Finner Primary Care Provider: Cheron Every Other Clinician: Referring Provider: Treating Provider/Extender: Allen Derry Self, Referral Weeks in Treatment: 0 Debridement Performed for Assessment: Wound #1 Left,Lateral Ankle Performed By: Physician Allen Derry, PA-C The following information was scribed by: Yevonne Pax The information was scribed for: Allen Derry Debridement Type: Debridement Severity of Tissue Pre Debridement: Fat layer exposed Level of Consciousness (Pre-procedure): Awake and Alert Pre-procedure Verification/Time Out Yes - 09:45 Taken: Start Time: 09:45 Percent of Wound Bed Debrided: 100% T Area Debrided (cm): otal 4.91 Tissue and other material debrided: Viable, Non-Viable, Slough, Subcutaneous, Skin: Dermis , Skin: Epidermis, Biofilm, Slough Level: Skin/Subcutaneous Tissue Debridement Description: Excisional Instrument: Curette Bleeding: Minimum Hemostasis Achieved:  Pressure Norma Carroll, Norma Carroll ([id number]) [id number] Page 2 of 9 End Time: 09:49 Procedural Pain: 3 Post Procedural Pain: 3 Response to Treatment: Procedure was tolerated well Level of Consciousness (Post- Awake and Alert procedure): Post Debridement Measurements of Total Wound Length: (cm) 2.5 Width: (cm) 2.5 Depth: (cm) 0.2 Volume: (cm) 0.982 Character of Wound/Ulcer Post Debridement: Improved Severity of Tissue Post Debridement: Fat layer exposed Post Procedure Diagnosis Same as Pre-procedure Electronic Signature(s) Signed: 11/01/2023 9:58:24 AM By: Yevonne Pax RN Signed: 11/01/2023 12:16:54 PM By: Allen Derry PA-C Entered By: Yevonne Pax on 11/01/2023 06:58:23 -------------------------------------------------------------------------------- HPI Details Patient Name: Date of Service: Norma Carroll, Norma NA J. 11/01/2023 8:45 A M Medical Record Number: [id number] Patient Account Number: [id number] Date of Birth/Sex: Treating RN: October 18, 1971 (52 y.o. Freddy Finner Primary Care Provider: Cheron Every Other Clinician: Referring Provider: Treating Provider/Extender: Allen Derry Self, Referral Weeks in Treatment: 0 History of Present Illness Chronic/Inactive Conditions Condition 1: 11-01-2023 on evaluation today patient's ABIs were noncompressible here in the clinic this could be due to the fact that her blood pressure is so high she seems to have a good pulse and good capillary refill. With that being said she also has a history of when she had the initial trauma this really did not bleed much. That is a little strange and I would like to further evaluate her arterial flow we will get a send her for formal testing. HPI Description: 11-01-2023 upon evaluation today patient presents today for a wound on left lateral ankle that occurred on July 01, 2023. She tells me that she hit this on the edge of a piece of furniture and it tore away a piece of tissue. Since  that time she has been seen in urgent care where they gave her antibiotic she is also been seen by PCP once but they really have not recommend anything from a wound care perspective for her at this point. Fortunately I do not see any signs of active infection at this time which is good with that being said there is a lot  of necrotic tissue we will definitely have to perform some debridement to clear away this necrotic debris. She voiced understanding. Patient does have a history of hypertension, atrial flutter for which she is gena be undergoing an ablation on January 16 and for which she is currently on Eliquis. She also has a history of diabetes mellitus type 2. Her most recent hemoglobin A1c was 7.4 on August 23, 2023. Electronic Signature(s) Signed: 11/01/2023 12:14:32 PM By: Allen Derry PA-C Entered By: Allen Derry on 11/01/2023 09:14:32 Norma Carroll, Norma Carroll ([id number]) [id number] Page 3 of 9 -------------------------------------------------------------------------------- Physical Exam Details Patient Name: Date of Service: Norma Carroll, Norma Carroll 11/01/2023 8:45 A M Medical Record Number: [id number] Patient Account Number: [id number] Date of Birth/Sex: Treating RN: May 15, 1971 (52 y.o. Freddy Finner Primary Care Provider: Cheron Every Other Clinician: Referring Provider: Treating Provider/Extender: Allen Derry Self, Referral Weeks in Treatment: 0 Constitutional patient is hypertensive.. pulse regular and within target range for patient.Marland Kitchen respirations regular, non-labored and within target range for patient.Marland Kitchen temperature within target range for patient.. Well-nourished and well-hydrated in no acute distress. Eyes conjunctiva clear no eyelid edema noted. pupils equal round and reactive to light and accommodation. Ears, Nose, Mouth, and Throat no gross abnormality of ear auricles or external auditory canals. normal hearing noted during conversation. mucus  membranes moist. Respiratory normal breathing without difficulty. Cardiovascular 2+ dorsalis pedis/posterior tibialis pulses. no clubbing, cyanosis, significant edema, <3 sec cap refill. Musculoskeletal normal gait and posture. no significant deformity or arthritic changes, no loss or range of motion, no clubbing. Psychiatric this patient is able to make decisions and demonstrates good insight into disease process. Alert and Oriented x 3. pleasant and cooperative. Notes Upon inspection patient's wound bed actually showed signs of good granulation and epithelization and minimal areas at this point she is going require quite a bit of debridement to clear away necrotic debris and she tolerated that debridement today actually without any significant pain and she did tell me she felt that but it was not terrible. Her pulses are good capillary refill is good and she does not have any significant swelling which is good news as well. With that being said I did perform debridement clearway slough and biofilm down to good subcutaneous tissue she tolerated that without complication postdebridement wound bed is significantly improved. Electronic Signature(s) Signed: 11/01/2023 12:15:07 PM By: Allen Derry PA-C Entered By: Allen Derry on 11/01/2023 09:15:06 -------------------------------------------------------------------------------- Physician Orders Details Patient Name: Date of Service: Norma Carroll, Norma NA J. 11/01/2023 8:45 A M Medical Record Number: [id number] Patient Account Number: [id number] Date of Birth/Sex: Treating RN: 15-May-1971 (52 y.o. Freddy Finner Primary Care Provider: Cheron Every Other Clinician: Referring Provider: Treating Provider/Extender: Allen Derry Self, Referral Weeks in Treatment: [address] KAMRIE, KLECHA ([id number]) [id number] Page 4 of 9 The following information was scribed by: Yevonne Pax The information was scribed for: Allen Derry Verbal /  Phone Orders: No Diagnosis Coding ICD-10 Coding Code Description E11.622 Type 2 diabetes mellitus with other skin ulcer L97.322 Non-pressure chronic ulcer of left ankle with fat layer exposed I10 Essential (primary) hypertension I48.92 Unspecified atrial flutter Z79.01 Long term (current) use of anticoagulants Follow-up Appointments Return Appointment in 1 week. Bathing/ Shower/ Hygiene May shower; gently cleanse wound with antibacterial soap, rinse and pat dry prior to dressing wounds Anesthetic (Use 'Patient Medications' Section for Anesthetic Order Entry) Lidocaine applied to wound bed Edema Control - Orders / Instructions Elevate, Exercise Daily and A void Standing for Long Periods of Time. Elevate legs to the  level of the heart and pump ankles as often as possible Elevate leg(s) parallel to the floor when sitting. Wound Treatment Wound #1 - Ankle Wound Laterality: Left, Lateral Cleanser: Soap and Water 3 x Per Week/30 Days Discharge Instructions: Gently cleanse wound with antibacterial soap, rinse and pat dry prior to dressing wounds Prim Dressing: Hydrofera Blue Ready Transfer Foam, 2.5x2.5 (in/in) 3 x Per Week/30 Days ary Discharge Instructions: Apply Hydrofera Blue Ready to wound bed as directed Secondary Dressing: (BORDER) Zetuvit Plus SILICONE BORDER Dressing 5x5 (in/in) 3 x Per Week/30 Days Discharge Instructions: Please do not put silicone bordered dressings under wraps. Use non-bordered dressing only. Consults Vascular - non healing wound - (ICD10 E11.622 - Type 2 diabetes mellitus with other skin ulcer) Electronic Signature(s) Signed: 11/01/2023 11:21:11 AM By: Yevonne Pax RN Signed: 11/01/2023 12:16:54 PM By: Allen Derry PA-C Entered By: Yevonne Pax on 11/01/2023 06:58:46 -------------------------------------------------------------------------------- Problem List Details Patient Name: Date of Service: Norma Carroll NA J. 11/01/2023 8:45 A M Medical Record Number:  [id number] Patient Account Number: [id number] Date of Birth/Sex: Treating RN: Aug 13, 1971 (52 y.o. Freddy Finner Primary Care Provider: Cheron Every Other Clinician: Referring Provider: Treating Provider/Extender: Allen Derry Self, Referral Weeks in Treatment: [address] Norma Carroll, Norma Carroll ([id number]) [id number] Page 5 of 9 ICD-10 Encounter Code Description Active Date MDM Diagnosis E11.622 Type 2 diabetes mellitus with other skin ulcer 11/01/2023 No Yes L97.322 Non-pressure chronic ulcer of left ankle with fat layer exposed 11/01/2023 No Yes I10 Essential (primary) hypertension 11/01/2023 No Yes I48.92 Unspecified atrial flutter 11/01/2023 No Yes Z79.01 Long term (current) use of anticoagulants 11/01/2023 No Yes Inactive Problems Resolved Problems Electronic Signature(s) Signed: 11/01/2023 9:36:54 AM By: Allen Derry PA-C Entered By: Allen Derry on 11/01/2023 06:36:54 -------------------------------------------------------------------------------- Progress Note Details Patient Name: Date of Service: Norma Carroll, Norma NA J. 11/01/2023 8:45 A M Medical Record Number: [id number] Patient Account Number: [id number] Date of Birth/Sex: Treating RN: [birth year] (52 y.o. Freddy Finner Primary Care Provider: Cheron Every Other Clinician: Referring Provider: Treating Provider/Extender: Allen Derry Self, Referral Weeks in Treatment: 0 Subjective Chief Complaint Information obtained from Patient Left lateral ankle ulcer History of Present Illness (HPI) Chronic/Inactive Condition: 11-01-2023 on evaluation today patient's ABIs were noncompressible here in the clinic this could be due to the fact that her blood pressure is so high she seems to have a good pulse and good capillary refill. With that being said she also has a history of when she had the initial trauma this really did not bleed much. That is a little strange and I would like to further  evaluate her arterial flow we will get a send her for formal testing. 11-01-2023 upon evaluation today patient presents today for a wound on left lateral ankle that occurred on July 01, 2023. She tells me that she hit this on the edge of a piece of furniture and it tore away a piece of tissue. Since that time she has been seen in urgent care where they gave her antibiotic she is also been seen by PCP once but they really have not recommend anything from a wound care perspective for her at this point. Fortunately I do not see any signs of active infection at this time which is good with that being said there is a lot of necrotic tissue we will definitely have to perform some debridement to clear away this necrotic debris. She voiced understanding. Patient does have a history of hypertension, atrial flutter for which she is gena be undergoing an  ablation on January 16 and for which she is currently on Eliquis. She also has a history of diabetes mellitus type 2. Her most recent hemoglobin A1c was 7.4 on August 23, 2023. EMBERLYNN, GEVEDON ([id number]) [id number] Page 6 of 9 Patient History Allergies No Known Allergies Social History Never smoker, Marital Status - Separated, Alcohol Use - Never, Drug Use - No History, Caffeine Use - Daily. Medical History Cardiovascular Patient has history of Arrhythmia - a flutter, Hypertension Endocrine Patient has history of Type II Diabetes Patient is treated with Insulin, Oral Agents. Review of Systems (ROS) Integumentary (Skin) Complains or has symptoms of Wounds. Objective Constitutional patient is hypertensive.. pulse regular and within target range for patient.Marland Kitchen respirations regular, non-labored and within target range for patient.Marland Kitchen temperature within target range for patient.. Well-nourished and well-hydrated in no acute distress. Vitals Time Taken: 9:00 AM, Height: 70 in, Source: Stated, Weight: 210 lbs, Source: Stated,  BMI: 30.1, Temperature: 97.9 F, Pulse: 57 bpm, Respiratory Rate: 18 breaths/min, Blood Pressure: 184/92 mmHg. Eyes conjunctiva clear no eyelid edema noted. pupils equal round and reactive to light and accommodation. Ears, Nose, Mouth, and Throat no gross abnormality of ear auricles or external auditory canals. normal hearing noted during conversation. mucus membranes moist. Respiratory normal breathing without difficulty. Cardiovascular 2+ dorsalis pedis/posterior tibialis pulses. no clubbing, cyanosis, significant edema, Musculoskeletal normal gait and posture. no significant deformity or arthritic changes, no loss or range of motion, no clubbing. Psychiatric this patient is able to make decisions and demonstrates good insight into disease process. Alert and Oriented x 3. pleasant and cooperative. General Notes: Upon inspection patient's wound bed actually showed signs of good granulation and epithelization and minimal areas at this point she is going require quite a bit of debridement to clear away necrotic debris and she tolerated that debridement today actually without any significant pain and she did tell me she felt that but it was not terrible. Her pulses are good capillary refill is good and she does not have any significant swelling which is good news as well. With that being said I did perform debridement clearway slough and biofilm down to good subcutaneous tissue she tolerated that without complication postdebridement wound bed is significantly improved. Integumentary (Hair, Skin) Wound #1 status is Open. Original cause of wound was Trauma. The date acquired was: 07/01/2023. The wound is located on the Left,Lateral Ankle. The wound measures 2.5cm length x 2.5cm width x 0.2cm depth; 4.909cm^2 area and 0.982cm^3 volume. There is Fat Layer (Subcutaneous Tissue) exposed. There is no tunneling or undermining noted. There is a medium amount of serosanguineous drainage noted. There is  medium (34-66%) red granulation within the wound bed. There is a medium (34-66%) amount of necrotic tissue within the wound bed including Adherent Slough. Assessment Active Problems ICD-10 Type 2 diabetes mellitus with other skin ulcer Non-pressure chronic ulcer of left ankle with fat layer exposed Essential (primary) hypertension Unspecified atrial flutter Long term (current) use of anticoagulants Procedures JERAH, CIRCELLI ([id number]) [id number] Page 7 of 9 Wound #1 Pre-procedure diagnosis of Wound #1 is a Diabetic Wound/Ulcer of the Lower Extremity located on the Left,Lateral Ankle .Severity of Tissue Pre Debridement is: Fat layer exposed. There was a Excisional Skin/Subcutaneous Tissue Debridement with a total area of 4.91 sq cm performed by Allen Derry, PA-C. With the following instrument(s): Curette to remove Viable and Non-Viable tissue/material. Material removed includes Subcutaneous Tissue, Slough, Skin: Dermis, Skin: Epidermis, and Biofilm. No specimens were taken. A time out was conducted at  09:45, prior to the start of the procedure. A Minimum amount of bleeding was controlled with Pressure. The procedure was tolerated well with a pain level of 3 throughout and a pain level of 3 following the procedure. Post Debridement Measurements: 2.5cm length x 2.5cm width x 0.2cm depth; 0.982cm^3 volume. Character of Wound/Ulcer Post Debridement is improved. Severity of Tissue Post Debridement is: Fat layer exposed. Post procedure Diagnosis Wound #1: Same as Pre-Procedure Plan Follow-up Appointments: Return Appointment in 1 week. Bathing/ Shower/ Hygiene: May shower; gently cleanse wound with antibacterial soap, rinse and pat dry prior to dressing wounds Anesthetic (Use 'Patient Medications' Section for Anesthetic Order Entry): Lidocaine applied to wound bed Edema Control - Orders / Instructions: Elevate, Exercise Daily and Avoid Standing for Long Periods of  Time. Elevate legs to the level of the heart and pump ankles as often as possible Elevate leg(s) parallel to the floor when sitting. Consults ordered were: Vascular - non healing wound WOUND #1: - Ankle Wound Laterality: Left, Lateral Cleanser: Soap and Water 3 x Per Week/30 Days Discharge Instructions: Gently cleanse wound with antibacterial soap, rinse and pat dry prior to dressing wounds Prim Dressing: Hydrofera Blue Ready Transfer Foam, 2.5x2.5 (in/in) 3 x Per Week/30 Days ary Discharge Instructions: Apply Hydrofera Blue Ready to wound bed as directed Secondary Dressing: (BORDER) Zetuvit Plus SILICONE BORDER Dressing 5x5 (in/in) 3 x Per Week/30 Days Discharge Instructions: Please do not put silicone bordered dressings under wraps. Use non-bordered dressing only. 1. Based on what I am seeing I would recommend that we initiate treatment with a Hydrofera Blue dressing which I think is good to be a good option here. 2. Also can recommend a bordered foam dressing to cover. 3. I am also going to suggest that we should go and send her for arterial testing to ensure that she has good flow she did have bleeding today with debridement but again she is on Eliquis I am not sure if that is covering up something that may be more significant. Since her ABIs were noncompressible here in the clinic we will see what vascular has to say with the formal arterial testing. We will see patient back for reevaluation in 1 week here in the clinic. If anything worsens or changes patient will contact our office for additional recommendations. Electronic Signature(s) Signed: 11/01/2023 12:15:53 PM By: Allen Derry PA-C Entered By: Allen Derry on 11/01/2023 09:15:53 -------------------------------------------------------------------------------- ROS/PFSH Details Patient Name: Date of Service: Jann, Norma NA J. 11/01/2023 8:45 A M Medical Record Number: [id number] Patient Account Number: [id number] Date of Birth/Sex:  Treating RN: [birth year] (52 y.o. Freddy Finner Primary Care Provider: Cheron Every Other Clinician: Referring Provider: Treating Provider/Extender: Allen Derry Self, Referral Weeks in Treatment: 0 Integumentary (Skin) Complaints and Symptoms: Positive for: Wounds NAKESIA, MCNEES ([id number]) [id number] Page 8 of 9 Cardiovascular Medical History: Positive for: Arrhythmia - a flutter; Hypertension Endocrine Medical History: Positive for: Type II Diabetes Time with diabetes: 20 years Treated with: Insulin, Oral agents Immunizations Pneumococcal Vaccine: Received Pneumococcal Vaccination: No Implantable Devices None Family and Social History Never smoker; Marital Status - Separated; Alcohol Use: Never; Drug Use: No History; Caffeine Use: Daily Social Determinants of Health (SDOH) 1. In the past 2 months, did you or others you live with eat smaller meals or skip meals because you didn't have money for foodo : No 2. Are you homeless or worried that you might be in the futureo : No 3. Do you have trouble paying for your utilities (gas,  electricity, phone)o : No 4. Do you have trouble finding or paying for a rideo : No 5. Do you need daycare, or better daycare, for your kidso : No 6. Are you unemployed or without regular incomeo : No 7. Do you need help finding a better jobo : No 8. Do you need help getting more educationo : No 9. Are you concerned about someone in your home using drugs or alcoholo : No 10. Do you feel unsafe in your daily lifeo : No 11. Is anyone in your home threatening or abusing youo : No 12. Do you lack quality relationships that make you feel valued and supportedo : No 13. Do you need help getting cultural information in a language you understando : No 14. Do you need help getting internet accesso : No Advanced Directives and Instructions Spiritual or Cultural beliefs preclude asking about Advance Care Planning: No Advanced  Directives: No Patient wants information on Advanced Directives: No Do not resuscitate: No Living Will: No Medical Power of Attorney: No Surrogate Decision Maker: No Electronic Signature(s) Signed: 11/01/2023 11:21:11 AM By: Yevonne Pax RN Signed: 11/01/2023 12:16:54 PM By: Allen Derry PA-C Entered By: Yevonne Pax on 11/01/2023 06:05:29 -------------------------------------------------------------------------------- SuperBill Details Patient Name: Date of Service: Norma Carroll NA J. 11/01/2023 Medical Record Number: [id number] Patient Account Number: [id number] Date of Birth/Sex: Treating RN: January 07, 1971 (53 y.o. Freddy Finner Primary Care Provider: Cheron Every Other Clinician: Referring Provider: Treating Provider/Extender: Allen Derry Self, Referral Weeks in Treatment: [address] HELAINE, BURFIELD ([id number]) [id number] Page 9 of 9 Diagnosis Coding ICD-10 Codes Code Description E11.622 Type 2 diabetes mellitus with other skin ulcer L97.322 Non-pressure chronic ulcer of left ankle with fat layer exposed I10 Essential (primary) hypertension I48.92 Unspecified atrial flutter Z79.01 Long term (current) use of anticoagulants Facility Procedures : CPT4 Code: [id number] Description: 99213 - WOUND CARE VISIT-LEV 3 EST PT Modifier: Quantity: 1 : CPT4 Code: [id number] Description: 11042 - DEB SUBQ TISSUE 20 SQ CM/< ICD-10 Diagnosis Description L97.322 Non-pressure chronic ulcer of left ankle with fat layer exposed Modifier: Quantity: 1 Physician Procedures : CPT4 Code Description Modifier [id number] 99204 - WC PHYS LEVEL 4 - NEW PT 25 ICD-10 Diagnosis Description E11.622 Type 2 diabetes mellitus with other skin ulcer L97.322 Non-pressure chronic ulcer of left ankle with fat layer exposed I10 Essential  (primary) hypertension I48.92 Unspecified atrial flutter Quantity: 1 : 11042 11042 - WC PHYS SUBQ TISS 20 SQ CM ICD-10 Diagnosis Description L97.322 Non-pressure  chronic ulcer of left ankle with fat layer exposed Quantity: 1 Electronic Signature(s) Signed: 11/01/2023 12:16:18 PM By: Allen Derry PA-C Previous Signature: 11/01/2023 11:21:11 AM Version By: Yevonne Pax RN Entered By: Allen Derry on 11/01/2023 09:16:18

## 2023-11-04 ENCOUNTER — Ambulatory Visit (HOSPITAL_COMMUNITY): Payer: Medicaid Other | Admitting: Physical Therapy

## 2023-11-07 ENCOUNTER — Ambulatory Visit (HOSPITAL_COMMUNITY): Payer: Medicaid Other

## 2023-11-15 ENCOUNTER — Encounter: Payer: Medicaid Other | Attending: Physician Assistant | Admitting: Physician Assistant

## 2023-11-15 DIAGNOSIS — I4892 Unspecified atrial flutter: Secondary | ICD-10-CM | POA: Diagnosis not present

## 2023-11-15 DIAGNOSIS — E11621 Type 2 diabetes mellitus with foot ulcer: Secondary | ICD-10-CM | POA: Diagnosis present

## 2023-11-15 DIAGNOSIS — L97322 Non-pressure chronic ulcer of left ankle with fat layer exposed: Secondary | ICD-10-CM | POA: Diagnosis not present

## 2023-11-15 DIAGNOSIS — I10 Essential (primary) hypertension: Secondary | ICD-10-CM | POA: Diagnosis not present

## 2023-11-15 DIAGNOSIS — Z7901 Long term (current) use of anticoagulants: Secondary | ICD-10-CM | POA: Diagnosis not present

## 2023-11-15 NOTE — Progress Notes (Addendum)
 Stefanko, Xuan Carroll ([id number]) [id number] Page 1 of 7 Visit Report for 11/15/2023 Chief Complaint Document Details Patient Name: Date of Service: Norma Carroll, Norma [place] Carroll. 11/15/2023 7:45 A M Medical Record Number: [id number] Patient Account Number: [id number] Date of Birth/Sex: Treating RN: February 27, 1971 (52 y.o. Norma Carroll Primary Care Provider: Johnson Jacobsen Other Clinician: Referring Provider: Treating Provider/Extender: Bethena Andre Johnson Jacobsen Weeks in Treatment: 2 Information Obtained from: Patient Chief Complaint Left lateral ankle ulcer Electronic Signature(s) Signed: 11/15/2023 7:54:44 AM By: Bethena Andre PA-C Entered By: Bethena Andre on 11/15/2023 04:54:44 -------------------------------------------------------------------------------- Debridement Details Patient Name: Date of Service: Norma Carroll, Norma NA Carroll. 11/15/2023 7:45 A M Medical Record Number: [id number] Patient Account Number: [id number] Date of Birth/Sex: Treating RN: February 27, 1971 (53 y.o. Norma Carroll Primary Care Provider: Johnson Jacobsen Other Clinician: Referring Provider: Treating Provider/Extender: Bethena Andre Johnson Jacobsen Weeks in Treatment: 2 Debridement Performed for Assessment: Wound #1 Left,Lateral Ankle Performed By: Physician Bethena Andre, PA-C The following information was scribed by: Claudene Carroll The information was scribed for: Bethena Andre Debridement Type: Debridement Severity of Tissue Pre Debridement: Fat layer exposed Level of Consciousness (Pre-procedure): Awake and Alert Pre-procedure Verification/Time Out Yes - 08:05 Taken: Start Time: 08:05 Pain Control: Lidocaine  4% T opical Solution Percent of Wound Bed Debrided: 100% T Area Debrided (cm): otal 3.61 Tissue and other material debrided: Viable, Non-Viable, Slough, Subcutaneous, Slough Level: Skin/Subcutaneous Tissue Debridement Description: Excisional Instrument: Curette Bleeding: Moderate MICHOLE, LECUYER  ([id number]) [id number] Page 2 of 7 Hemostasis Achieved: Pressure Procedural Pain: 0 Post Procedural Pain: 0 Response to Treatment: Procedure was tolerated well Level of Consciousness (Post- Awake and Alert procedure): Post Debridement Measurements of Total Wound Length: (cm) 2.3 Width: (cm) 2 Depth: (cm) 0.2 Volume: (cm) 0.723 Character of Wound/Ulcer Post Debridement: Stable Severity of Tissue Post Debridement: Fat layer exposed Post Procedure Diagnosis Same as Pre-procedure Electronic Signature(s) Signed: 11/15/2023 12:49:39 PM By: Bethena Andre PA-C Signed: 11/15/2023 12:54:59 PM By: Claudene Blossom MSN RN CNS WTA Entered By: Claudene Carroll on 11/15/2023 05:09:26 -------------------------------------------------------------------------------- HPI Details Patient Name: Date of Service: Norma Carroll, Norma NA Carroll. 11/15/2023 7:45 A M Medical Record Number: [id number] Patient Account Number: [id number] Date of Birth/Sex: Treating RN: February 27, 1971 (53 y.o. Norma Carroll Primary Care Provider: Johnson Jacobsen Other Clinician: Referring Provider: Treating Provider/Extender: Bethena Andre Johnson Jacobsen Weeks in Treatment: 2 History of Present Illness Chronic/Inactive Conditions Condition 1: 11-01-2023 on evaluation today patient's ABIs were noncompressible here in the clinic this could be due to the fact that her blood pressure is so high she seems to have a good pulse and good capillary refill. With that being said she also has a history of when she had the initial trauma this really did not bleed much. That is a little strange and I would like to further evaluate her arterial flow we will get a send her for formal testing. HPI Description: 11-01-2023 upon evaluation today patient presents today for a wound on left lateral ankle that occurred on July 01, 2023. She tells me that she hit this on the edge of a piece of furniture and it tore away a piece of tissue. Since that  time she has been seen in urgent care where they gave her antibiotic she is also been seen by PCP once but they really have not recommend anything from a wound care perspective for her at this point. Fortunately I do not see any signs of active infection at this time which is good with that being said there is a  lot of necrotic tissue we will definitely have to perform some debridement to clear away this necrotic debris. She voiced understanding. Patient does have a history of hypertension, atrial flutter for which she is gena be undergoing an ablation on January 16 and for which she is currently on Eliquis . She also has a history of diabetes mellitus type 2. Her most recent hemoglobin A1c was 7.4 on August 23, 2023. 11-14-2022 upon evaluation today patient appears to be doing well currently in regard to her wound. This does seem to be showing some signs of improvement which is good news. With that being said I do not see any evidence of active infection at this time which is also good news and from a size standpoint we are a little bit smaller compared to where he were previous which is also excellent. These wounds along the lateral portion of the ankle often take quite some time to completely heal and she is aware of this as well. With that being said I do believe that the Hydrofera Blue seems to be doing pretty well for her however. Electronic Signature(s) Signed: 11/15/2023 8:46:02 AM By: Bethena Ferraris PA-C Entered By: Bethena Ferraris on 11/15/2023 05:46:02 Norma Carroll [name] [id number] Page 3 of 7 -------------------------------------------------------------------------------- Physical Exam Details Patient Name: Date of Service: Norma Carroll, Norma Carroll 11/15/2023 7:45 A M Medical Record Number: [id number] Patient Account Number: [id number] Date of Birth/Sex: Treating RN: [birth year] (52 y.o. Norma Carroll Primary Care Provider: Johnson Jacobsen Other  Clinician: Referring Provider: Treating Provider/Extender: Bethena Ferraris Johnson Jacobsen Weeks in Treatment: 2 Constitutional Well-nourished and well-hydrated in no acute distress. Respiratory normal breathing without difficulty. Psychiatric this patient is able to make decisions and demonstrates good insight into disease process. Alert and Oriented x 3. pleasant and cooperative. Notes Upon inspection patient's wound bed actually showed signs of good granulation and epithelization at this point. Fortunately I do not see any signs of worsening overall and I do believe that the patient is making headway here towards closure. I did perform debridement to clear away necrotic debris and postdebridement wound bed appears to be doing much better. Electronic Signature(s) Signed: 11/15/2023 8:46:51 AM By: Bethena Ferraris PA-C Entered By: Bethena Ferraris on 11/15/2023 05:46:51 -------------------------------------------------------------------------------- Physician Orders Details Patient Name: Date of Service: Norma Carroll, Norma NA Carroll. 11/15/2023 7:45 A M Medical Record Number: [id number] Patient Account Number: [id number] Date of Birth/Sex: Treating RN: [birth year] (52 y.o. Norma Carroll Primary Care Provider: Johnson Jacobsen Other Clinician: Referring Provider: Treating Provider/Extender: Bethena Ferraris Johnson Jacobsen Weeks in Treatment: 2 The following information was scribed by: Claudene Carroll The information was scribed for: Bethena Ferraris Verbal / Phone Orders: No Diagnosis Coding ICD-10 Coding Code Description E11.622 Type 2 diabetes mellitus with other skin ulcer L97.322 Non-pressure chronic ulcer of left ankle with fat layer exposed I10 Essential (primary) hypertension I48.92 Unspecified atrial flutter Z79.01 Long term (current) use of anticoagulants Norma Carroll, Norma Carroll ([id number]) [id number] Page 4 of 7 Follow-up Appointments Return Appointment in 1 week. Bathing/ Shower/  Hygiene May shower; gently cleanse wound with antibacterial soap, rinse and pat dry prior to dressing wounds Anesthetic (Use 'Patient Medications' Section for Anesthetic Order Entry) Lidocaine  applied to wound bed Edema Control - Orders / Instructions Tubigrip double layer applied - EE Elevate, Exercise Daily and A void Standing for Long Periods of Time. Elevate legs to the level of the heart and pump ankles as often as possible Elevate leg(s) parallel to the floor when sitting. Wound Treatment Wound #1 -  Ankle Wound Laterality: Left, Lateral Cleanser: Soap and Water 3 x Per Week/30 Days Discharge Instructions: Gently cleanse wound with antibacterial soap, rinse and pat dry prior to dressing wounds Prim Dressing: Hydrofera Blue Ready Transfer Foam, 2.5x2.5 (in/in) 3 x Per Week/30 Days ary Discharge Instructions: Apply Hydrofera Blue Ready to wound bed as directed Secondary Dressing: Zetuvit Plus 4x4 (in/in) 3 x Per Week/30 Days Electronic Signature(s) Signed: 11/15/2023 12:49:39 PM By: Bethena Ferraris PA-C Signed: 11/15/2023 12:54:59 PM By: Claudene Blossom MSN RN CNS WTA Previous Signature: 11/15/2023 8:24:15 AM Version By: Claudene Blossom MSN RN CNS WTA Entered By: Claudene Carroll on 11/15/2023 05:25:13 -------------------------------------------------------------------------------- Problem List Details Patient Name: Date of Service: Norma Carroll, Norma NA Carroll. 11/15/2023 7:45 A M Medical Record Number: [id number] Patient Account Number: [id number] Date of Birth/Sex: Treating RN: [birth year] (53 y.o. Norma Carroll Primary Care Provider: Johnson Jacobsen Other Clinician: Referring Provider: Treating Provider/Extender: Bethena Ferraris Johnson Jacobsen Weeks in Treatment: 2 Active Problems ICD-10 Encounter Code Description Active Date MDM Diagnosis E11.622 Type 2 diabetes mellitus with other skin ulcer 11/01/2023 No Yes L97.322 Non-pressure chronic ulcer of left ankle with fat layer exposed 11/01/2023 No Yes I10  Essential (primary) hypertension 11/01/2023 No Yes I48.92 Unspecified atrial flutter 11/01/2023 No Yes SAROYA, RICCOBONO ([id number]) [phone].pdf Page 5 of 7 Z79.01 Long term (current) use of anticoagulants 11/01/2023 No Yes Inactive Problems Resolved Problems Electronic Signature(s) Signed: 11/15/2023 7:54:42 AM By: Bethena Ferraris PA-C Entered By: Bethena Ferraris on 11/15/2023 04:54:42 -------------------------------------------------------------------------------- Progress Note Details Patient Name: Date of Service: Norma Carroll, Norma NA Carroll. 11/15/2023 7:45 A M Medical Record Number: [id number] Patient Account Number: [id number] Date of Birth/Sex: Treating RN: [birth year] (52 y.o. Norma Carroll Primary Care Provider: Johnson Jacobsen Other Clinician: Referring Provider: Treating Provider/Extender: Bethena Ferraris Johnson Jacobsen Weeks in Treatment: 2 Subjective Chief Complaint Information obtained from Patient Left lateral ankle ulcer History of Present Illness (HPI) Chronic/Inactive Condition: 11-01-2023 on evaluation today patient's ABIs were noncompressible here in the clinic this could be due to the fact that her blood pressure is so high she seems to have a good pulse and good capillary refill. With that being said she also has a history of when she had the initial trauma this really did not bleed much. That is a little strange and I would like to further evaluate her arterial flow we will get a send her for formal testing. 11-01-2023 upon evaluation today patient presents today for a wound on left lateral ankle that occurred on July 01, 2023. She tells me that she hit this on the edge of a piece of furniture and it tore away a piece of tissue. Since that time she has been seen in urgent care where they gave her antibiotic she is also been seen by PCP once but they really have not recommend anything from a wound care perspective for her at this point. Fortunately I do not  see any signs of active infection at this time which is good with that being said there is a lot of necrotic tissue we will definitely have to perform some debridement to clear away this necrotic debris. She voiced understanding. Patient does have a history of hypertension, atrial flutter for which she is gena be undergoing an ablation on January 16 and for which she is currently on Eliquis . She also has a history of diabetes mellitus type 2. Her most recent hemoglobin A1c was 7.4 on August 23, 2023. 11-14-2022 upon evaluation today patient appears to be doing well currently in  regard to her wound. This does seem to be showing some signs of improvement which is good news. With that being said I do not see any evidence of active infection at this time which is also good news and from a size standpoint we are a little bit smaller compared to where he were previous which is also excellent. These wounds along the lateral portion of the ankle often take quite some time to completely heal and she is aware of this as well. With that being said I do believe that the Hydrofera Blue seems to be doing pretty well for her however. Objective Constitutional Well-nourished and well-hydrated in no acute distress. Vitals Time Taken: 7:51 AM, Height: 70 in, Weight: 210 lbs, BMI: 30.1, Temperature: 98.1 F, Pulse: 65 bpm, Respiratory Rate: 18 breaths/min, Blood Pressure: 155/77 mmHg. Norma Carroll, Norma Carroll ([id number]) [id number] Page 6 of 7 Respiratory normal breathing without difficulty. Psychiatric this patient is able to make decisions and demonstrates good insight into disease process. Alert and Oriented x 3. pleasant and cooperative. General Notes: Upon inspection patient's wound bed actually showed signs of good granulation and epithelization at this point. Fortunately I do not see any signs of worsening overall and I do believe that the patient is making headway here towards closure. I  did perform debridement to clear away necrotic debris and postdebridement wound bed appears to be doing much better. Integumentary (Hair, Skin) Wound #1 status is Open. Original cause of wound was Trauma. The date acquired was: 07/01/2023. The wound has been in treatment 2 weeks. The wound is located on the Left,Lateral Ankle. The wound measures 2.3cm length x 2cm width x 0.2cm depth; 3.613cm^2 area and 0.723cm^3 volume. There is Fat Layer (Subcutaneous Tissue) exposed. There is a medium amount of serosanguineous drainage noted. There is medium (34-66%) red granulation within the wound bed. There is a medium (34-66%) amount of necrotic tissue within the wound bed including Adherent Slough. Assessment Active Problems ICD-10 Type 2 diabetes mellitus with other skin ulcer Non-pressure chronic ulcer of left ankle with fat layer exposed Essential (primary) hypertension Unspecified atrial flutter Long term (current) use of anticoagulants Procedures Wound #1 Pre-procedure diagnosis of Wound #1 is a Diabetic Wound/Ulcer of the Lower Extremity located on the Left,Lateral Ankle .Severity of Tissue Pre Debridement is: Fat layer exposed. There was a Excisional Skin/Subcutaneous Tissue Debridement with a total area of 3.61 sq cm performed by Bethena Ferraris, PA-C. With the following instrument(s): Curette to remove Viable and Non-Viable tissue/material. Material removed includes Subcutaneous Tissue and Slough and after achieving pain control using Lidocaine  4% T opical Solution. No specimens were taken. A time out was conducted at 08:05, prior to the start of the procedure. A Moderate amount of bleeding was controlled with Pressure. The procedure was tolerated well with a pain level of 0 throughout and a pain level of 0 following the procedure. Post Debridement Measurements: 2.3cm length x 2cm width x 0.2cm depth; 0.723cm^3 volume. Character of Wound/Ulcer Post Debridement is stable. Severity of Tissue Post  Debridement is: Fat layer exposed. Post procedure Diagnosis Wound #1: Same as Pre-Procedure Plan Follow-up Appointments: Return Appointment in 1 week. Bathing/ Shower/ Hygiene: May shower; gently cleanse wound with antibacterial soap, rinse and pat dry prior to dressing wounds Anesthetic (Use 'Patient Medications' Section for Anesthetic Order Entry): Lidocaine  applied to wound bed Edema Control - Orders / Instructions: Tubigrip double layer applied - EE Elevate, Exercise Daily and Avoid Standing for Long Periods of Time. Elevate legs to the level  of the heart and pump ankles as often as possible Elevate leg(s) parallel to the floor when sitting. WOUND #1: - Ankle Wound Laterality: Left, Lateral Cleanser: Soap and Water 3 x Per Week/30 Days Discharge Instructions: Gently cleanse wound with antibacterial soap, rinse and pat dry prior to dressing wounds Prim Dressing: Hydrofera Blue Ready Transfer Foam, 2.5x2.5 (in/in) 3 x Per Week/30 Days ary Discharge Instructions: Apply Hydrofera Blue Ready to wound bed as directed Secondary Dressing: Zetuvit Plus 4x4 (in/in) 3 x Per Week/30 Days 1. I would recommend based on what we are seeing that we have the patient going and continue with the Hydrofera Blue which I feel like is doing a good job here. 2. Also can recommend the patient should continue to monitor for any signs of infection or worsening obviously if anything changes she knows contact the office let me know. 3. I am also going to suggest that the patient should utilize the Tubigrip which we will get a go ahead and get started for her today as well. I think that this could be of benefit working to use double layer size E Tubigrip for now. 4. I am also going to go ahead and proceed forward with getting her scheduled for arterial study this was supposed to have been ordered previous but apparently was missed. Will try to get this scheduled as quickly as possible. At suspect that her blood  flow is probably essentially okay but at the same time I just want to be certain based on the fact we could not get a good reading here in the clinic whenever we performed her ABI screening. Norma Carroll, Norma Carroll ([id number]) [id number] Page 7 of 7 We will see patient back for reevaluation in 1 week here in the clinic. If anything worsens or changes patient will contact our office for additional recommendations. Electronic Signature(s) Signed: 11/15/2023 8:48:32 AM By: Bethena Ferraris PA-C Entered By: Bethena Ferraris on 11/15/2023 05:48:32 -------------------------------------------------------------------------------- SuperBill Details Patient Name: Date of Service: Norma Carroll, Norma NA Carroll. 11/15/2023 Medical Record Number: [id number] Patient Account Number: [id number] Date of Birth/Sex: Treating RN: September 03, 1971 (52 y.o. Norma Carroll Primary Care Provider: Johnson Jacobsen Other Clinician: Referring Provider: Treating Provider/Extender: Bethena Ferraris Johnson Jacobsen Weeks in Treatment: 2 Diagnosis Coding ICD-10 Codes Code Description [phone] Type 2 diabetes mellitus with other skin ulcer L97.322 Non-pressure chronic ulcer of left ankle with fat layer exposed I10 Essential (primary) hypertension I48.92 Unspecified atrial flutter Z79.01 Long term (current) use of anticoagulants Facility Procedures : CPT4 Code: [id number] Description: 11042 - DEB SUBQ TISSUE 20 SQ CM/< ICD-10 Diagnosis Description L97.322 Non-pressure chronic ulcer of left ankle with fat layer exposed Modifier: Quantity: 1 Physician Procedures : CPT4 Code Description Modifier 11042 11042 - WC PHYS SUBQ TISS 20 SQ CM ICD-10 Diagnosis Description L97.322 Non-pressure chronic ulcer of left ankle with fat layer exposed Quantity: 1 Electronic Signature(s) Signed: 11/15/2023 8:48:46 AM By: Bethena Ferraris PA-C Entered By: Bethena Ferraris on 11/15/2023 05:48:46

## 2023-11-15 NOTE — Progress Notes (Signed)
 Achterberg, Oleda J ([id number]) [id number] Page 1 of 9 Visit Report for 11/15/2023 Arrival Information Details Patient Name: Date of Service: VARETTA, CHAVERS [place] J. 11/15/2023 7:45 A M Medical Record Number: [id number] Patient Account Number: [id number] Date of Birth/Sex: Treating RN: Nov 18, 1970 (53 y.o. JEANELL Claudene Blossom Primary Care Denver Bentson: Johnson Jacobsen Other Clinician: Referring Daquon Greenleaf: Treating Kire Ferg/Extender: Bethena Andre Johnson Jacobsen Weeks in Treatment: 2 Visit Information History Since Last Visit Added or deleted any medications: No Patient Arrived: Ambulatory Any new allergies or adverse reactions: No Arrival Time: 07:46 Had a fall or experienced change in No Accompanied By: self activities of daily living that may affect Transfer Assistance: None risk of falls: Patient Identification Verified: Yes Signs or symptoms of abuse/neglect since last visito No Secondary Verification Process Completed: Yes Hospitalized since last visit: No Patient Requires Transmission-Based Precautions: No Has Dressing in Place as Prescribed: Yes Patient Has Alerts: Yes Pain Present Now: No Patient Alerts: Patient on Blood Thinner Diabetic NON COMPRESSABLE Electronic Signature(s) Signed: 11/15/2023 8:21:57 AM By: Claudene Blossom MSN RN CNS WTA Entered By: Claudene Blossom on 11/15/2023 08:21:56 -------------------------------------------------------------------------------- Clinic Level of Care Assessment Details Patient Name: Date of Service: [place], [name] 11/15/2023 7:45 A M Medical Record Number: [id number] Patient Account Number: [id number] Date of Birth/Sex: Treating RN: Nov 18, 1970 (53 y.o. JEANELL Claudene Blossom Primary Care Emre Stock: Johnson Jacobsen Other Clinician: Referring Ladon Vandenberghe: Treating Otis Burress/Extender: Bethena Andre Johnson Jacobsen Weeks in Treatment: 2 Clinic Level of Care Assessment Items TOOL 1 Quantity Score []  - 0 Use when EandM and Procedure is  performed on INITIAL visit ASSESSMENTS - Nursing Assessment / Reassessment []  - 0 General Physical Exam (combine w/ comprehensive assessment (listed just below) when performed on new pt. evals) []  - 0 Comprehensive Assessment (HX, ROS, Risk Assessments, Wounds Hx, etc.) ASSESSMENTS - Wound and Skin Assessment / Reassessment []  - 0 Dermatologic / Skin Assessment (not related to wound area) CHRISTYL, OSENTOSKI ([id number]) [id number] Page 2 of 9 ASSESSMENTS - Ostomy and/or Continence Assessment and Care []  - 0 Incontinence Assessment and Management []  - 0 Ostomy Care Assessment and Management (repouching, etc.) PROCESS - Coordination of Care []  - 0 Simple Patient / Family Education for ongoing care []  - 0 Complex (extensive) Patient / Family Education for ongoing care []  - 0 Staff obtains Chiropractor, Records, T Results / Process Orders est []  - 0 Staff telephones HHA, Nursing Homes / Clarify orders / etc []  - 0 Routine Transfer to another Facility (non-emergent condition) []  - 0 Routine Hospital Admission (non-emergent condition) []  - 0 New Admissions / Manufacturing Engineer / Ordering NPWT Apligraf, etc. , []  - 0 Emergency Hospital Admission (emergent condition) PROCESS - Special Needs []  - 0 Pediatric / Minor Patient Management []  - 0 Isolation Patient Management []  - 0 Hearing / Language / Visual special needs []  - 0 Assessment of Community assistance (transportation, D/C planning, etc.) []  - 0 Additional assistance / Altered mentation []  - 0 Support Surface(s) Assessment (bed, cushion, seat, etc.) INTERVENTIONS - Miscellaneous []  - 0 External ear exam []  - 0 Patient Transfer (multiple staff / Nurse, Adult / Similar devices) []  - 0 Simple Staple / Suture removal (25 or less) []  - 0 Complex Staple / Suture removal (26 or more) []  - 0 Hypo/Hyperglycemic Management (do not check if billed separately) []  - 0 Ankle / Brachial Index (ABI) -  do not check if billed separately Has the patient been seen at the hospital within the last three years: Yes Total Score: 0  Level Of Care: ____ Electronic Signature(s) Signed: 11/15/2023 12:54:59 PM By: Claudene Blossom MSN RN CNS WTA Entered By: Claudene Blossom on 11/15/2023 08:24:21 -------------------------------------------------------------------------------- Encounter Discharge Information Details Patient Name: Date of Service: [place], [name] 11/15/2023 7:45 A M Medical Record Number: [id number] Patient Account Number: [id number] Date of Birth/Sex: Treating RN: [birth year] (53 y.o. JEANELL Claudene Blossom Primary Care Isaiah Torok: Johnson Jacobsen Other Clinician: Referring Christeena Krogh: Treating Prudy Candy/Extender: Bethena Andre Johnson Jacobsen Weeks in Treatment: 2 Encounter Discharge Information Items Post Procedure ELNOR, RENOVATO ([id number]) [id number] Page 3 of 9 Discharge Condition: Stable Temperature (F): 98.1 Ambulatory Status: Ambulatory Pulse (bpm): 65 Discharge Destination: Home Respiratory Rate (breaths/min): 18 Transportation: Private Auto Blood Pressure (mmHg): 155/77 Accompanied By: self Schedule Follow-up Appointment: Yes Clinical Summary of Care: Electronic Signature(s) Signed: 11/15/2023 8:26:02 AM By: Claudene Blossom MSN RN CNS WTA Entered By: Claudene Blossom on 11/15/2023 08:26:02 -------------------------------------------------------------------------------- Lower Extremity Assessment Details Patient Name: Date of Service: [place], [name] 11/15/2023 7:45 A M Medical Record Number: [id number] Patient Account Number: [id number] Date of Birth/Sex: Treating RN: [birth year] (53 y.o. JEANELL Claudene Blossom Primary Care Andretta Ergle: Johnson Jacobsen Other Clinician: Referring Khaila Velarde: Treating Leonna Schlee/Extender: Bethena Andre Johnson Jacobsen Weeks in Treatment: 2 Edema Assessment Assessed: [Left: Yes] [Right: No] [Left: Edema] [Right: :] Calf Left:  Right: Point of Measurement: 38 cm From Medial Instep 40 cm Ankle Left: Right: Point of Measurement: 12 cm From Medial Instep 24 cm Knee To Floor Left: Right: From Medial Instep 48 cm Vascular Assessment Pulses: Dorsalis Pedis Palpable: [Left:Yes] Extremity colors, hair growth, and conditions: Extremity Color: [Left:Normal] Hair Growth on Extremity: [Left:Yes] Temperature of Extremity: [Left:Warm] Capillary Refill: [Left:< 3 seconds] Dependent Rubor: [Left:No] Blanched when Elevated: [Left:No No] Toe Nail Assessment Left: Right: Thick: No Discolored: No Deformed: No Improper Length and Hygiene: No DELANDA, BULLUCK ([id number]) [id number] Page 4 of 9 Electronic Signature(s) Signed: 11/15/2023 8:22:11 AM By: Claudene Blossom MSN RN CNS WTA Entered By: Claudene Blossom on 11/15/2023 08:22:11 -------------------------------------------------------------------------------- Multi Wound Chart Details Patient Name: Date of Service: [place], [name] 11/15/2023 7:45 A M Medical Record Number: [id number] Patient Account Number: [id number] Date of Birth/Sex: Treating RN: [birth year] (53 y.o. JEANELL Claudene Blossom Primary Care Deandre Stansel: Johnson Jacobsen Other Clinician: Referring Judy Goodenow: Treating Fleet Higham/Extender: Bethena Andre Johnson Jacobsen Weeks in Treatment: 2 Vital Signs Height(in): 70 Pulse(bpm): 65 Weight(lbs): 210 Blood Pressure(mmHg): 155/77 Body Mass Index(BMI): 30.1 Temperature(F): 98.1 Respiratory Rate(breaths/min): 18 [1:Photos:] [N/A:N/A] Left, Lateral Ankle N/A N/A Wound Location: Trauma N/A N/A Wounding Event: Diabetic Wound/Ulcer of the Lower N/A N/A Primary Etiology: Extremity Arrhythmia, Hypertension, Type II N/A N/A Comorbid History: Diabetes 07/01/2023 N/A N/A Date Acquired: 2 N/A N/A Weeks of Treatment: Open N/A N/A Wound Status: No N/A N/A Wound Recurrence: 2.3x2x0.2 N/A N/A Measurements L x W x D (cm) 3.613 N/A N/A A (cm)  : rea 0.723 N/A N/A Volume (cm) : 26.40% N/A N/A % Reduction in A rea: 26.40% N/A N/A % Reduction in Volume: Grade 1 N/A N/A Classification: Medium N/A N/A Exudate A mount: Serosanguineous N/A N/A Exudate Type: red, brown N/A N/A Exudate Color: Medium (34-66%) N/A N/A Granulation A mount: Red N/A N/A Granulation Quality: Medium (34-66%) N/A N/A Necrotic A mount: Fat Layer (Subcutaneous Tissue): Yes N/A N/A Exposed Structures: Fascia: No Tendon: No Muscle: No Joint: No Bone: No None N/A N/A Epithelialization: Debridement - Excisional N/A N/A Debridement: Pre-procedure Verification/Time Out 08:05 N/A N/A Taken: Lidocaine  4% Topical Solution N/A N/A Pain ControlVERDELLA, LAIDLAW ([id number]) [id number] Page  5 of 9 Subcutaneous, Slough N/A N/A Tissue Debrided: Skin/Subcutaneous Tissue N/A N/A Level: 3.61 N/A N/A Debridement A (sq cm): rea Curette N/A N/A Instrument: Moderate N/A N/A Bleeding: Pressure N/A N/A Hemostasis A chieved: 0 N/A N/A Procedural Pain: 0 N/A N/A Post Procedural Pain: Procedure was tolerated well N/A N/A Debridement Treatment Response: 2.3x2x0.2 N/A N/A Post Debridement Measurements L x W x D (cm) 0.723 N/A N/A Post Debridement Volume: (cm) Debridement N/A N/A Procedures Performed: Treatment Notes Electronic Signature(s) Signed: 11/15/2023 8:22:16 AM By: Claudene Blossom MSN RN CNS WTA Entered By: Claudene Blossom on 11/15/2023 08:22:16 -------------------------------------------------------------------------------- Multi-Disciplinary Care Plan Details Patient Name: Date of Service: BEVERLEY MEIERS NA J. 11/15/2023 7:45 A M Medical Record Number: [id number] Patient Account Number: [id number] Date of Birth/Sex: Treating RN: [birth year] (52 y.o. JEANELL Claudene Blossom Primary Care Salam Micucci: Johnson Jacobsen Other Clinician: Referring Tressa Maldonado: Treating Avelardo Reesman/Extender: Bethena Andre Johnson Jacobsen Weeks in Treatment:  2 Active Inactive Necrotic Tissue Nursing Diagnoses: Knowledge deficit related to management of necrotic/devitalized tissue Goals: Necrotic/devitalized tissue will be minimized in the wound bed Date Initiated: 11/01/2023 Target Resolution Date: 12/02/2023 Goal Status: Active Interventions: Assess patient pain level pre-, during and post procedure and prior to discharge Notes: Wound/Skin Impairment Nursing Diagnoses: Knowledge deficit related to ulceration/compromised skin integrity Goals: Patient/caregiver will verbalize understanding of skin care regimen Date Initiated: 11/01/2023 Target Resolution Date: 12/02/2023 Goal Status: Active Ulcer/skin breakdown will have a volume reduction of 30% by week 4 Date Initiated: 11/01/2023 Target Resolution Date: 12/02/2023 Goal Status: Active Ulcer/skin breakdown will have a volume reduction of 50% by week 8 Date Initiated: 11/01/2023 Target Resolution Date: 01/02/2024 Goal Status: Active FLOYD, LUSIGNAN ([id number]) [phone].pdf Page 6 of 9 Ulcer/skin breakdown will have a volume reduction of 80% by week 12 Date Initiated: 11/01/2023 Target Resolution Date: 01/30/2024 Goal Status: Active Ulcer/skin breakdown will heal within 14 weeks Date Initiated: 11/01/2023 Target Resolution Date: 03/01/2024 Goal Status: Active Interventions: Assess patient/caregiver ability to obtain necessary supplies Assess patient/caregiver ability to perform ulcer/skin care regimen upon admission and as needed Assess ulceration(s) every visit Notes: Electronic Signature(s) Signed: 11/15/2023 12:54:59 PM By: Claudene Blossom MSN RN CNS WTA Entered By: Claudene Blossom on 11/15/2023 08:02:33 -------------------------------------------------------------------------------- Pain Assessment Details Patient Name: Date of Service: BEVERLEY MEIERS NA J. 11/15/2023 7:45 A M Medical Record Number: [id number] Patient Account Number: [id number] Date of Birth/Sex:  Treating RN: [birth year] (53 y.o. JEANELL Claudene Blossom Primary Care Keylin Podolsky: Johnson Jacobsen Other Clinician: Referring Jannice Beitzel: Treating Malikah Lakey/Extender: Bethena Andre Johnson Jacobsen Weeks in Treatment: 2 Active Problems Location of Pain Severity and Description of Pain Patient Has Paino Yes Site Locations Rate the pain. Current Pain Level: 2 Pain Management and Medication Current Pain Management: Electronic Signature(s) Signed: 11/15/2023 8:22:04 AM By: Claudene Blossom MSN RN CNS WTA Entered By: Claudene Blossom on 11/15/2023 08:22:04 BEVERLEY [name] [id number] Page 7 of 9 -------------------------------------------------------------------------------- Patient/Caregiver Education Details Patient Name: Date of Service: ROSALINA, [place] 1/3/2025andnbsp7:45 A M Medical Record Number: [id number] Patient Account Number: [id number] Date of Birth/Gender: Treating RN: [birth year] (52 y.o. JEANELL Claudene Blossom Primary Care Physician: Johnson Jacobsen Other Clinician: Referring Physician: Treating Physician/Extender: Bethena Andre Johnson Jacobsen Devra in Treatment: 2 Education Assessment Education Provided To: Patient Education Topics Provided Wound Debridement: Handouts: Wound Debridement Methods: Explain/Verbal Responses: State content correctly Wound/Skin Impairment: Handouts: Caring for Your Ulcer Methods: Explain/Verbal Responses: State content correctly Electronic Signature(s) Signed: 11/15/2023 12:54:59 PM By: Claudene Blossom MSN RN CNS WTA Entered By: Claudene Blossom on 11/15/2023 08:02:48 -------------------------------------------------------------------------------- Wound  Assessment Details Patient Name: Date of Service: LONA, [place] 11/15/2023 7:45 A M Medical Record Number: [id number] Patient Account Number: [id number] Date of Birth/Sex: Treating RN: 1971/05/25 (52 y.o. JEANELL Claudene Blossom Primary Care Ciela Mahajan: Johnson Jacobsen Other  Clinician: Referring Kieffer Blatz: Treating Johnson Arizola/Extender: Bethena Andre Johnson Jacobsen Weeks in Treatment: 2 Wound Status Wound Number: 1 Primary Etiology: Diabetic Wound/Ulcer of the Lower Extremity Wound Location: Left, Lateral Ankle Wound Status: Open Wounding Event: Trauma Comorbid History: Arrhythmia, Hypertension, Type II Diabetes Date Acquired: 07/01/2023 Weeks Of Treatment: 2 Clustered Wound: No TEMISHA, MURLEY ([id number]) [phone].pdf Page 8 of 9 Photos Wound Measurements Length: (cm) 2.3 Width: (cm) 2 Depth: (cm) 0.2 Area: (cm) 3.613 Volume: (cm) 0.723 % Reduction in Area: 26.4% % Reduction in Volume: 26.4% Epithelialization: None Wound Description Classification: Grade 1 Exudate Amount: Medium Exudate Type: Serosanguineous Exudate Color: red, brown Foul Odor After Cleansing: No Slough/Fibrino Yes Wound Bed Granulation Amount: Medium (34-66%) Exposed Structure Granulation Quality: Red Fascia Exposed: No Necrotic Amount: Medium (34-66%) Fat Layer (Subcutaneous Tissue) Exposed: Yes Necrotic Quality: Adherent Slough Tendon Exposed: No Muscle Exposed: No Joint Exposed: No Bone Exposed: No Treatment Notes Wound #1 (Ankle) Wound Laterality: Left, Lateral Cleanser Soap and Water Discharge Instruction: Gently cleanse wound with antibacterial soap, rinse and pat dry prior to dressing wounds Peri-Wound Care Topical Primary Dressing Hydrofera Blue Ready Transfer Foam, 2.5x2.5 (in/in) Discharge Instruction: Apply Hydrofera Blue Ready to wound bed as directed Secondary Dressing (BORDER) Zetuvit Plus SILICONE BORDER Dressing 5x5 (in/in) Discharge Instruction: Please do not put silicone bordered dressings under wraps. Use non-bordered dressing only. Secured With Compression Wrap Compression Stockings Facilities Manager) Signed: 11/15/2023 12:54:59 PM By: Claudene Blossom MSN RN CNS WTA Entered By: Claudene Blossom on 11/15/2023  07:55:29 BEVERLEY [name] [id number] Page 9 of 9 -------------------------------------------------------------------------------- Vitals Details Patient Name: Date of Service: REMMY, [place] 11/15/2023 7:45 A M Medical Record Number: [id number] Patient Account Number: [id number] Date of Birth/Sex: Treating RN: 1971/05/25 (52 y.o. JEANELL Claudene Blossom Primary Care Emidio Warrell: Johnson Jacobsen Other Clinician: Referring Clema Skousen: Treating Sashia Campas/Extender: Bethena Andre Johnson Jacobsen Weeks in Treatment: 2 Vital Signs Time Taken: 07:51 Temperature (F): 98.1 Height (in): 70 Pulse (bpm): 65 Weight (lbs): 210 Respiratory Rate (breaths/min): 18 Body Mass Index (BMI): 30.1 Blood Pressure (mmHg): 155/77 Reference Range: 80 - 120 mg / dl Electronic Signature(s) Signed: 11/15/2023 8:22:00 AM By: Claudene Blossom MSN RN CNS WTA Entered By: Claudene Blossom on 11/15/2023 08:22:00

## 2023-11-22 ENCOUNTER — Encounter: Payer: Medicaid Other | Admitting: Physician Assistant

## 2023-11-22 DIAGNOSIS — E11621 Type 2 diabetes mellitus with foot ulcer: Secondary | ICD-10-CM | POA: Diagnosis not present

## 2023-11-22 NOTE — Progress Notes (Addendum)
 Stai, Fahmida Carroll ([id number]) [id number] Page 1 of 7 Visit Report for 11/22/2023 Chief Complaint Document Details Patient Name: Date of Service: Norma Carroll, Norma [place] Carroll. 11/22/2023 8:00 A M Medical Record Number: [id number] Patient Account Number: [id number] Date of Birth/Sex: Treating RN: [birth year] (52 y.o. Norma Carroll Primary Care Provider: Johnson Jacobsen Other Clinician: Referring Provider: Treating Provider/Extender: Bethena Andre Johnson Jacobsen Weeks in Treatment: 3 Information Obtained from: Patient Chief Complaint Left lateral ankle ulcer Electronic Signature(s) Signed: 11/22/2023 8:13:44 AM By: Bethena Andre PA-C Entered By: Bethena Andre on 11/22/2023 08:13:44 -------------------------------------------------------------------------------- Debridement Details Patient Name: Date of Service: Norma Carroll, Norma NA Carroll. 11/22/2023 8:00 A M Medical Record Number: [id number] Patient Account Number: [id number] Date of Birth/Sex: Treating RN: [birth year] (52 y.o. Norma Carroll Primary Care Provider: Johnson Jacobsen Other Clinician: Referring Provider: Treating Provider/Extender: Bethena Andre Johnson Jacobsen Weeks in Treatment: 3 Debridement Performed for Assessment: Wound #1 Left,Lateral Ankle Performed By: Physician Bethena Andre, PA-C The following information was scribed by: Alverta Carroll The information was scribed for: Bethena Andre Debridement Type: Debridement Severity of Tissue Pre Debridement: Fat layer exposed Level of Consciousness (Pre-procedure): Awake and Alert Pre-procedure Verification/Time Out Yes - 08:29 Taken: Start Time: 08:29 Percent of Wound Bed Debrided: 100% T Area Debrided (cm): otal 3.61 Tissue and other material debrided: Viable, Non-Viable, Slough, Subcutaneous, Slough Level: Skin/Subcutaneous Tissue Debridement Description: Excisional Instrument: Curette Bleeding: Moderate Hemostasis Achieved: Pressure TATYM, SCHERMER ([id number])  [id number] Page 2 of 7 End Time: 08:33 Procedural Pain: 0 Post Procedural Pain: 0 Response to Treatment: Procedure was tolerated well Level of Consciousness (Post- Awake and Alert procedure): Post Debridement Measurements of Total Wound Length: (cm) 2.3 Width: (cm) 2 Depth: (cm) 0.2 Volume: (cm) 0.723 Character of Wound/Ulcer Post Debridement: Improved Severity of Tissue Post Debridement: Fat layer exposed Post Procedure Diagnosis Same as Pre-procedure Electronic Signature(s) Signed: 11/22/2023 10:10:35 AM By: Alverta Sailors RN Signed: 11/22/2023 12:58:57 PM By: Bethena Andre PA-C Entered By: Alverta Carroll on 11/22/2023 08:32:48 -------------------------------------------------------------------------------- HPI Details Patient Name: Date of Service: Norma Carroll, Norma NA Carroll. 11/22/2023 8:00 A M Medical Record Number: [id number] Patient Account Number: [id number] Date of Birth/Sex: Treating RN: [birth year] (53 y.o. Norma Carroll Primary Care Provider: Johnson Jacobsen Other Clinician: Referring Provider: Treating Provider/Extender: Bethena Andre Johnson Jacobsen Weeks in Treatment: 3 History of Present Illness Chronic/Inactive Conditions Condition 1: 11-01-2023 on evaluation today patient's ABIs were noncompressible here in the clinic this could be due to the fact that her blood pressure is so high she seems to have a good pulse and good capillary refill. With that being said she also has a history of when she had the initial trauma this really did not bleed much. That is a little strange and I would like to further evaluate her arterial flow we will get a send her for formal testing. HPI Description: 11-01-2023 upon evaluation today patient presents today for a wound on left lateral ankle that occurred on July 01, 2023. She tells me that she hit this on the edge of a piece of furniture and it tore away a piece of tissue. Since that time she has been seen in urgent  care where they gave her antibiotic she is also been seen by PCP once but they really have not recommend anything from a wound care perspective for her at this point. Fortunately I do not see any signs of active infection at this time which is good with that being said there is a lot of necrotic tissue we will definitely  have to perform some debridement to clear away this necrotic debris. She voiced understanding. Patient does have a history of hypertension, atrial flutter for which she is gena be undergoing an ablation on January 16 and for which she is currently on Eliquis . She also has a history of diabetes mellitus type 2. Her most recent hemoglobin A1c was 7.4 on August 23, 2023. 11-14-2022 upon evaluation today patient appears to be doing well currently in regard to her wound. This does seem to be showing some signs of improvement which is good news. With that being said I do not see any evidence of active infection at this time which is also good news and from a size standpoint we are a little bit smaller compared to where he were previous which is also excellent. These wounds along the lateral portion of the ankle often take quite some time to completely heal and she is aware of this as well. With that being said I do believe that the Hydrofera Blue seems to be doing pretty well for her however. 11-22-23 upon evaluation today patient appears to be doing well currently in regard to her wound. She is showing signs of improvement and very pleased in that regard. Fortunately I do not see any signs of worsening there is some need for sharp debridement today and we will go ahead and proceed with that at this point. Electronic Signature(s) Signed: 11/22/2023 12:49:31 PM By: Bethena Ferraris PA-C Entered By: Bethena Ferraris on 11/22/2023 12:49:31 Norma Carroll, Norma Carroll ([id number]) [id number] Page 3 of  7 -------------------------------------------------------------------------------- Physical Exam Details Patient Name: Date of Service: Norma Carroll, Norma [place] Carroll. 11/22/2023 8:00 A M Medical Record Number: [id number] Patient Account Number: [id number] Date of Birth/Sex: Treating RN: 1971-10-11 (52 y.o. Norma Carroll Primary Care Provider: Johnson Jacobsen Other Clinician: Referring Provider: Treating Provider/Extender: Bethena Ferraris Johnson Jacobsen Weeks in Treatment: 3 Constitutional Well-nourished and well-hydrated in no acute distress. Respiratory normal breathing without difficulty. Psychiatric this patient is able to make decisions and demonstrates good insight into disease process. Alert and Oriented x 3. pleasant and cooperative. Notes Patient's wound bed did have some necrotic slough and biofilm that I had noted on the surface of the wound I did perform debridement down to good subcutaneous tissue she tolerated that today without complication and postdebridement wound bed is significantly improved. Electronic Signature(s) Signed: 11/22/2023 12:49:43 PM By: Bethena Ferraris PA-C Entered By: Bethena Ferraris on 11/22/2023 12:49:43 -------------------------------------------------------------------------------- Physician Orders Details Patient Name: Date of Service: Norma Carroll, Norma NA Carroll. 11/22/2023 8:00 A M Medical Record Number: [id number] Patient Account Number: [id number] Date of Birth/Sex: Treating RN: 1971-10-11 (52 y.o. Norma Carroll Primary Care Provider: Johnson Jacobsen Other Clinician: Referring Provider: Treating Provider/Extender: Bethena Ferraris Johnson Jacobsen Weeks in Treatment: 3 The following information was scribed by: Alverta Carroll The information was scribed for: Bethena Ferraris Verbal / Phone Orders: No Diagnosis Coding ICD-10 Coding Code Description E11.622 Type 2 diabetes mellitus with other skin ulcer L97.322 Non-pressure chronic ulcer of left ankle with fat layer exposed I10  Essential (primary) hypertension I48.92 Unspecified atrial flutter Z79.01 Long term (current) use of anticoagulants Norma Carroll, Norma Carroll ([id number]) [id number] Page 4 of 7 Follow-up Appointments Return Appointment in 1 week. Bathing/ Shower/ Hygiene May shower; gently cleanse wound with antibacterial soap, rinse and pat dry prior to dressing wounds Anesthetic (Use 'Patient Medications' Section for Anesthetic Order Entry) Lidocaine  applied to wound bed Edema Control - Orders / Instructions Elevate, Exercise Daily and A void Standing for Long Periods of Time.  Elevate legs to the level of the heart and pump ankles as often as possible Elevate leg(s) parallel to the floor when sitting. Wound Treatment Wound #1 - Ankle Wound Laterality: Left, Lateral Cleanser: Soap and Water 3 x Per Week/30 Days Discharge Instructions: Gently cleanse wound with antibacterial soap, rinse and pat dry prior to dressing wounds Prim Dressing: Promogran Matrix 4.34 (in) 3 x Per Week/30 Days ary Discharge Instructions: Moisten w/normal saline or sterile water; Cover wound as directed. Do not remove from wound bed. Secondary Dressing: Zetuvit Plus 4x4 (in/in) 3 x Per Week/30 Days Electronic Signature(s) Signed: 11/22/2023 10:10:35 AM By: Alverta Sailors RN Signed: 11/22/2023 12:58:57 PM By: Bethena Ferraris PA-C Entered By: Alverta Carroll on 11/22/2023 08:31:18 -------------------------------------------------------------------------------- Problem List Details Patient Name: Date of Service: Norma Carroll, Norma NA Carroll. 11/22/2023 8:00 A M Medical Record Number: [id number] Patient Account Number: [id number] Date of Birth/Sex: Treating RN: October 07, 1971 (52 y.o. Norma Carroll Primary Care Provider: Johnson Jacobsen Other Clinician: Referring Provider: Treating Provider/Extender: Bethena Ferraris Johnson Jacobsen Weeks in Treatment: 3 Active Problems ICD-10 Encounter Code Description Active Date  MDM Diagnosis E11.622 Type 2 diabetes mellitus with other skin ulcer 11/01/2023 No Yes L97.322 Non-pressure chronic ulcer of left ankle with fat layer exposed 11/01/2023 No Yes I10 Essential (primary) hypertension 11/01/2023 No Yes I48.92 Unspecified atrial flutter 11/01/2023 No Yes Norma Carroll, Norma Carroll ([id number]) [phone].pdf Page 5 of 7 Z79.01 Long term (current) use of anticoagulants 11/01/2023 No Yes Inactive Problems Resolved Problems Electronic Signature(s) Signed: 11/22/2023 8:13:42 AM By: Bethena Ferraris PA-C Entered By: Bethena Ferraris on 11/22/2023 08:13:42 -------------------------------------------------------------------------------- Progress Note Details Patient Name: Date of Service: Norma Carroll, Norma NA Carroll. 11/22/2023 8:00 A M Medical Record Number: [id number] Patient Account Number: [id number] Date of Birth/Sex: Treating RN: October 07, 1971 (52 y.o. Norma Carroll Primary Care Provider: Johnson Jacobsen Other Clinician: Referring Provider: Treating Provider/Extender: Bethena Ferraris Johnson Jacobsen Weeks in Treatment: 3 Subjective Chief Complaint Information obtained from Patient Left lateral ankle ulcer History of Present Illness (HPI) Chronic/Inactive Condition: 11-01-2023 on evaluation today patient's ABIs were noncompressible here in the clinic this could be due to the fact that her blood pressure is so high she seems to have a good pulse and good capillary refill. With that being said she also has a history of when she had the initial trauma this really did not bleed much. That is a little strange and I would like to further evaluate her arterial flow we will get a send her for formal testing. 11-01-2023 upon evaluation today patient presents today for a wound on left lateral ankle that occurred on July 01, 2023. She tells me that she hit this on the edge of a piece of furniture and it tore away a piece of tissue. Since that time she has been seen in urgent  care where they gave her antibiotic she is also been seen by PCP once but they really have not recommend anything from a wound care perspective for her at this point. Fortunately I do not see any signs of active infection at this time which is good with that being said there is a lot of necrotic tissue we will definitely have to perform some debridement to clear away this necrotic debris. She voiced understanding. Patient does have a history of hypertension, atrial flutter for which she is gena be undergoing an ablation on January 16 and for which she is currently on Eliquis . She also has a history of diabetes mellitus type 2. Her most recent hemoglobin A1c was 7.4  on August 23, 2023. 11-14-2022 upon evaluation today patient appears to be doing well currently in regard to her wound. This does seem to be showing some signs of improvement which is good news. With that being said I do not see any evidence of active infection at this time which is also good news and from a size standpoint we are a little bit smaller compared to where he were previous which is also excellent. These wounds along the lateral portion of the ankle often take quite some time to completely heal and she is aware of this as well. With that being said I do believe that the Hydrofera Blue seems to be doing pretty well for her however. 11-22-23 upon evaluation today patient appears to be doing well currently in regard to her wound. She is showing signs of improvement and very pleased in that regard. Fortunately I do not see any signs of worsening there is some need for sharp debridement today and we will go ahead and proceed with that at this point. Objective Constitutional Well-nourished and well-hydrated in no acute distress. Vitals Time Taken: 8:08 AM, Height: 70 in, Weight: 210 lbs, BMI: 30.1, Temperature: 97.8 F, Pulse: 57 bpm, Respiratory Rate: 18 breaths/min, Blood Pressure: Norma Carroll, Norma Carroll ([id number])  [id number].pdf Page 6 of 7 172/92 mmHg. Respiratory normal breathing without difficulty. Psychiatric this patient is able to make decisions and demonstrates good insight into disease process. Alert and Oriented x 3. pleasant and cooperative. General Notes: Patient's wound bed did have some necrotic slough and biofilm that I had noted on the surface of the wound I did perform debridement down to good subcutaneous tissue she tolerated that today without complication and postdebridement wound bed is significantly improved. Integumentary (Hair, Skin) Wound #1 status is Open. Original cause of wound was Trauma. The date acquired was: 07/01/2023. The wound has been in treatment 3 weeks. The wound is located on the Left,Lateral Ankle. The wound measures 2.3cm length x 2cm width x 0.2cm depth; 3.613cm^2 area and 0.723cm^3 volume. There is Fat Layer (Subcutaneous Tissue) exposed. There is no tunneling or undermining noted. There is a medium amount of serosanguineous drainage noted. There is medium (34-66%) red granulation within the wound bed. There is a medium (34-66%) amount of necrotic tissue within the wound bed including Adherent Slough. Assessment Active Problems ICD-10 Type 2 diabetes mellitus with other skin ulcer Non-pressure chronic ulcer of left ankle with fat layer exposed Essential (primary) hypertension Unspecified atrial flutter Long term (current) use of anticoagulants Procedures Wound #1 Pre-procedure diagnosis of Wound #1 is a Diabetic Wound/Ulcer of the Lower Extremity located on the Left,Lateral Ankle .Severity of Tissue Pre Debridement is: Fat layer exposed. There was a Excisional Skin/Subcutaneous Tissue Debridement with a total area of 3.61 sq cm performed by Bethena Ferraris, PA-C. With the following instrument(s): Curette to remove Viable and Non-Viable tissue/material. Material removed includes Subcutaneous Tissue and Slough and. No specimens were taken.  A time out was conducted at 08:29, prior to the start of the procedure. A Moderate amount of bleeding was controlled with Pressure. The procedure was tolerated well with a pain level of 0 throughout and a pain level of 0 following the procedure. Post Debridement Measurements: 2.3cm length x 2cm width x 0.2cm depth; 0.723cm^3 volume. Character of Wound/Ulcer Post Debridement is improved. Severity of Tissue Post Debridement is: Fat layer exposed. Post procedure Diagnosis Wound #1: Same as Pre-Procedure Plan Follow-up Appointments: Return Appointment in 1 week. Bathing/ Shower/ Hygiene: May shower; gently cleanse  wound with antibacterial soap, rinse and pat dry prior to dressing wounds Anesthetic (Use 'Patient Medications' Section for Anesthetic Order Entry): Lidocaine  applied to wound bed Edema Control - Orders / Instructions: Elevate, Exercise Daily and Avoid Standing for Long Periods of Time. Elevate legs to the level of the heart and pump ankles as often as possible Elevate leg(s) parallel to the floor when sitting. WOUND #1: - Ankle Wound Laterality: Left, Lateral Cleanser: Soap and Water 3 x Per Week/30 Days Discharge Instructions: Gently cleanse wound with antibacterial soap, rinse and pat dry prior to dressing wounds Prim Dressing: Promogran Matrix 4.34 (in) 3 x Per Week/30 Days ary Discharge Instructions: Moisten w/normal saline or sterile water; Cover wound as directed. Do not remove from wound bed. Secondary Dressing: Zetuvit Plus 4x4 (in/in) 3 x Per Week/30 Days 1. I would recommend that we have the patient continue to monitor for any evidence of infection or worsening. Based on what I see right now I do believe that this patient is experiencing significant issues here with a wound over the lateral malleolus on the left leg which unfortunately is a very difficult area to heal nonetheless I feel like she is making really good progress I do feel like the wound was a little bit  dry. 2. Because the wound was dry I am going to make a switch to using Promogran instead of the Hydrofera Blue or get a see how this does over the next week. 3. I will also recommend we continue with the Zetuvit to cover change 3 times per week. We will see patient back for reevaluation in 1 week here in the clinic. If anything worsens or changes patient will contact our office for additional recommendations. Norma Carroll, Norma Carroll ([id number]) [id number] Page 7 of 7 Electronic Signature(s) Signed: 11/22/2023 12:50:55 PM By: Bethena Ferraris PA-C Entered By: Bethena Ferraris on 11/22/2023 12:50:54 -------------------------------------------------------------------------------- SuperBill Details Patient Name: Date of Service: Norma Carroll, Norma NA Carroll. 11/22/2023 Medical Record Number: [id number] Patient Account Number: [id number] Date of Birth/Sex: Treating RN: [birth year] (52 y.o. Norma Carroll Primary Care Provider: Johnson Jacobsen Other Clinician: Referring Provider: Treating Provider/Extender: Bethena Ferraris Johnson Jacobsen Weeks in Treatment: 3 Diagnosis Coding ICD-10 Codes Code Description E11.622 Type 2 diabetes mellitus with other skin ulcer L97.322 Non-pressure chronic ulcer of left ankle with fat layer exposed I10 Essential (primary) hypertension I48.92 Unspecified atrial flutter Z79.01 Long term (current) use of anticoagulants Facility Procedures : CPT4 Code: [id number] Description: 11042 - DEB SUBQ TISSUE 20 SQ CM/< ICD-10 Diagnosis Description L97.322 Non-pressure chronic ulcer of left ankle with fat layer exposed Modifier: Quantity: 1 Physician Procedures : CPT4 Code Description Modifier 11042 11042 - WC PHYS SUBQ TISS 20 SQ CM ICD-10 Diagnosis Description L97.322 Non-pressure chronic ulcer of left ankle with fat layer exposed Quantity: 1 Electronic Signature(s) Signed: 11/22/2023 12:51:21 PM By: Bethena Ferraris PA-C Entered By: Bethena Ferraris on 11/22/2023 12:51:21

## 2023-11-22 NOTE — Progress Notes (Addendum)
 Finken, Monicia Carroll ([id number]) [id number] Page 1 of 9 Visit Report for 11/22/2023 Arrival Information Details Patient Name: Date of Service: Norma Carroll, Norma Carroll. 11/22/2023 8:00 A M Medical Record Number: [id number] Patient Account Number: [id number] Date of Birth/Sex: Treating RN: 07-Apr-1971 (52 y.o. Norma Carroll Primary Care Ruthanna Macchia: Johnson Jacobsen Other Clinician: Referring Skyleen Bentley: Treating Paizley Ramella/Extender: Bethena Andre Johnson Jacobsen Weeks in Treatment: 3 Visit Information History Since Last Visit Added or deleted any medications: No Patient Arrived: Ambulatory Any new allergies or adverse reactions: No Arrival Time: 08:05 Had a fall or experienced change in No Accompanied By: self activities of daily living that may affect Transfer Assistance: None risk of falls: Patient Identification Verified: Yes Signs or symptoms of abuse/neglect since last visito No Secondary Verification Process Completed: Yes Hospitalized since last visit: No Patient Requires Transmission-Based Precautions: No Implantable device outside of the clinic excluding No Patient Has Alerts: Yes cellular tissue based products placed in the center Patient Alerts: Patient on Blood Thinner since last visit: Diabetic Has Dressing in Place as Prescribed: Yes NON COMPRESSABLE Pain Present Now: No Electronic Signature(s) Signed: 11/22/2023 8:27:37 AM By: Alverta Sailors RN Entered By: Alverta Carroll on 11/22/2023 08:08:25 -------------------------------------------------------------------------------- Clinic Level of Care Assessment Details Patient Name: Date of Service: Norma Carroll, Norma Carroll. 11/22/2023 8:00 A M Medical Record Number: [id number] Patient Account Number: [id number] Date of Birth/Sex: Treating RN: 07-Apr-1971 (53 y.o. Norma Carroll Primary Care Saida Lonon: Johnson Jacobsen Other Clinician: Referring Lucynda Rosano: Treating Lynae Pederson/Extender: Bethena Andre Johnson Jacobsen Weeks in  Treatment: 3 Clinic Level of Care Assessment Items TOOL 1 Quantity Score []  - 0 Use when EandM and Procedure is performed on INITIAL visit ASSESSMENTS - Nursing Assessment / Reassessment []  - 0 General Physical Exam (combine w/ comprehensive assessment (listed just below) when performed on new pt. evals) []  - 0 Comprehensive Assessment (HX, ROS, Risk Assessments, Wounds Hx, etc.) Norma Carroll, Norma Carroll ([id number]) [id number] Page 2 of 9 ASSESSMENTS - Wound and Skin Assessment / Reassessment []  - 0 Dermatologic / Skin Assessment (not related to wound area) ASSESSMENTS - Ostomy and/or Continence Assessment and Care []  - 0 Incontinence Assessment and Management []  - 0 Ostomy Care Assessment and Management (repouching, etc.) PROCESS - Coordination of Care []  - 0 Simple Patient / Family Education for ongoing care []  - 0 Complex (extensive) Patient / Family Education for ongoing care []  - 0 Staff obtains Chiropractor, Records, T Results / Process Orders est []  - 0 Staff telephones HHA, Nursing Homes / Clarify orders / etc []  - 0 Routine Transfer to another Facility (non-emergent condition) []  - 0 Routine Hospital Admission (non-emergent condition) []  - 0 New Admissions / Manufacturing Engineer / Ordering NPWT Apligraf, etc. , []  - 0 Emergency Hospital Admission (emergent condition) PROCESS - Special Needs []  - 0 Pediatric / Minor Patient Management []  - 0 Isolation Patient Management []  - 0 Hearing / Language / Visual special needs []  - 0 Assessment of Community assistance (transportation, D/C planning, etc.) []  - 0 Additional assistance / Altered mentation []  - 0 Support Surface(s) Assessment (bed, cushion, seat, etc.) INTERVENTIONS - Miscellaneous []  - 0 External ear exam []  - 0 Patient Transfer (multiple staff / Nurse, Adult / Similar devices) []  - 0 Simple Staple / Suture removal (25 or less) []  - 0 Complex Staple / Suture removal (26 or  more) []  - 0 Hypo/Hyperglycemic Management (do not check if billed separately) []  - 0 Ankle / Brachial Index (ABI) - do not check if billed separately Has  the patient been seen at the hospital within the last three years: Yes Total Score: 0 Level Of Care: ____ Electronic Signature(s) Signed: 11/22/2023 10:10:35 AM By: Alverta Sailors RN Entered By: Alverta Carroll on 11/22/2023 08:33:01 -------------------------------------------------------------------------------- Encounter Discharge Information Details Patient Name: Date of Service: Norma Carroll, Norma Carroll. 11/22/2023 8:00 A M Medical Record Number: [id number] Patient Account Number: [id number] Date of Birth/Sex: Treating RN: Jan 11, 1971 (52 y.o. Norma Carroll Primary Care Sharley Keeler: Johnson Jacobsen Other Clinician: Referring Ben Sanz: Treating Caliber Landess/Extender: Bethena Andre Johnson Jacobsen Weeks in Treatment: 3 Carroll, Norma PARAS ([id number]) [id number] Page 3 of 9 Encounter Discharge Information Items Post Procedure Vitals Discharge Condition: Stable Temperature (F): 97.8 Ambulatory Status: Ambulatory Pulse (bpm): 57 Discharge Destination: Home Respiratory Rate (breaths/min): 18 Transportation: Private Auto Blood Pressure (mmHg): 172/92 Accompanied By: self Schedule Follow-up Appointment: Yes Clinical Summary of Care: Electronic Signature(s) Signed: 11/22/2023 10:10:35 AM By: Alverta Sailors RN Entered By: Alverta Carroll on 11/22/2023 08:33:41 -------------------------------------------------------------------------------- Lower Extremity Assessment Details Patient Name: Date of Service: Norma Carroll, Norma Carroll. 11/22/2023 8:00 A M Medical Record Number: [id number] Patient Account Number: [id number] Date of Birth/Sex: Treating RN: Jan 11, 1971 (52 y.o. Norma Carroll Primary Care Breanda Greenlaw: Johnson Jacobsen Other Clinician: Referring Halley Kincer: Treating Alecia Doi/Extender: Bethena Andre Johnson Jacobsen Weeks in Treatment:  3 Edema Assessment Assessed: [Left: No] [Right: No] Edema: [Left: N] [Right: o] Calf Left: Right: Point of Measurement: 38 cm From Medial Instep 39 cm Ankle Left: Right: Point of Measurement: 12 cm From Medial Instep 24 cm Knee To Floor Left: Right: From Medial Instep 48 cm Vascular Assessment Pulses: Dorsalis Pedis Palpable: [Left:No] Extremity colors, hair growth, and conditions: Extremity Color: [Left:Hyperpigmented] Hair Growth on Extremity: [Left:No] Temperature of Extremity: [Left:Warm] Capillary Refill: [Left:< 3 seconds] Dependent Rubor: [Left:No] Blanched when Elevated: [Left:No No] Toe Nail Assessment Left: Right: Thick: No Discolored: No Deformed: No KAYTE, BORCHARD ([id number]) [phone].pdf Page 4 of 9 Improper Length and Hygiene: No Electronic Signature(s) Signed: 11/22/2023 8:27:37 AM By: Alverta Sailors RN Entered By: Alverta Carroll on 11/22/2023 08:12:43 -------------------------------------------------------------------------------- Multi Wound Chart Details Patient Name: Date of Service: Norma Carroll, Norma Carroll. 11/22/2023 8:00 A M Medical Record Number: [id number] Patient Account Number: [id number] Date of Birth/Sex: Treating RN: Jan 11, 1971 (52 y.o. Norma Carroll Primary Care Angelyse Heslin: Johnson Jacobsen Other Clinician: Referring Minnette Merida: Treating Damante Spragg/Extender: Bethena Andre Johnson Jacobsen Weeks in Treatment: 3 Vital Signs Height(in): 70 Pulse(bpm): 57 Weight(lbs): 210 Blood Pressure(mmHg): 172/92 Body Mass Index(BMI): 30.1 Temperature(F): 97.8 Respiratory Rate(breaths/min): 18 [1:Photos:] [N/A:N/A] Left, Lateral Ankle N/A N/A Wound Location: Trauma N/A N/A Wounding Event: Diabetic Wound/Ulcer of the Lower N/A N/A Primary Etiology: Extremity Arrhythmia, Hypertension, Type II N/A N/A Comorbid History: Diabetes 07/01/2023 N/A N/A Date Acquired: 3 N/A N/A Weeks of Treatment: Open N/A N/A Wound Status: No N/A  N/A Wound Recurrence: 2.3x2x0.2 N/A N/A Measurements L x W x D (cm) 3.613 N/A N/A A (cm) : rea 0.723 N/A N/A Volume (cm) : 26.40% N/A N/A % Reduction in A rea: 26.40% N/A N/A % Reduction in Volume: Grade 1 N/A N/A Classification: Medium N/A N/A Exudate A mount: Serosanguineous N/A N/A Exudate Type: red, brown N/A N/A Exudate Color: Medium (34-66%) N/A N/A Granulation A mount: Red N/A N/A Granulation Quality: Medium (34-66%) N/A N/A Necrotic A mount: Fat Layer (Subcutaneous Tissue): Yes N/A N/A Exposed Structures: Fascia: No Tendon: No Muscle: No Joint: No Bone: No None N/A N/A EpithelializationTASHEMA, Norma Carroll ([id number]) [id number] Page 5 of 9 Treatment Notes Electronic Signature(s) Signed: 11/22/2023 8:27:37 AM By: Alverta,  Elenor RN Entered By: Alverta Elenor on 11/22/2023 08:12:47 -------------------------------------------------------------------------------- Multi-Disciplinary Care Plan Details Patient Name: Date of Service: Norma Carroll, Norma Carroll. 11/22/2023 8:00 A M Medical Record Number: [id number] Patient Account Number: [id number] Date of Birth/Sex: Treating RN: [birth year] (52 y.o. Norma Alverta Elenor Primary Care Samaa Ueda: Johnson Jacobsen Other Clinician: Referring Sanjith Siwek: Treating Aerik Polan/Extender: Bethena Andre Johnson Jacobsen Weeks in Treatment: 3 Active Inactive Necrotic Tissue Nursing Diagnoses: Knowledge deficit related to management of necrotic/devitalized tissue Goals: Necrotic/devitalized tissue will be minimized in the wound bed Date Initiated: 11/01/2023 Target Resolution Date: 12/02/2023 Goal Status: Active Interventions: Assess patient pain level pre-, during and post procedure and prior to discharge Notes: Wound/Skin Impairment Nursing Diagnoses: Knowledge deficit related to ulceration/compromised skin integrity Goals: Patient/caregiver will verbalize understanding of skin care regimen Date Initiated:  11/01/2023 Target Resolution Date: 12/02/2023 Goal Status: Active Ulcer/skin breakdown will have a volume reduction of 30% by week 4 Date Initiated: 11/01/2023 Target Resolution Date: 12/02/2023 Goal Status: Active Ulcer/skin breakdown will have a volume reduction of 50% by week 8 Date Initiated: 11/01/2023 Target Resolution Date: 01/02/2024 Goal Status: Active Ulcer/skin breakdown will have a volume reduction of 80% by week 12 Date Initiated: 11/01/2023 Target Resolution Date: 01/30/2024 Goal Status: Active Ulcer/skin breakdown will heal within 14 weeks Date Initiated: 11/01/2023 Target Resolution Date: 03/01/2024 Goal Status: Active Interventions: Assess patient/caregiver ability to obtain necessary supplies Assess patient/caregiver ability to perform ulcer/skin care regimen upon admission and as needed Assess ulceration(s) every visit Notes: Norma Carroll, Norma Carroll ([id number]) [id number].pdf Page 6 of 9 Electronic Signature(s) Signed: 11/22/2023 8:27:37 AM By: Alverta Elenor RN Entered By: Alverta Elenor on 11/22/2023 08:13:04 -------------------------------------------------------------------------------- Pain Assessment Details Patient Name: Date of Service: Norma Carroll, Norma Carroll. 11/22/2023 8:00 A M Medical Record Number: [id number] Patient Account Number: [id number] Date of Birth/Sex: Treating RN: [birth year] (52 y.o. Norma Alverta Elenor Primary Care Addilee Neu: Johnson Jacobsen Other Clinician: Referring Tamyka Bezio: Treating Laylah Riga/Extender: Bethena Andre Johnson Jacobsen Weeks in Treatment: 3 Active Problems Location of Pain Severity and Description of Pain Patient Has Paino No Site Locations Pain Management and Medication Current Pain Management: Electronic Signature(s) Signed: 11/22/2023 8:27:37 AM By: Alverta Elenor RN Entered By: Alverta Elenor on 11/22/2023 08:08:57 Patient/Caregiver Education  Details -------------------------------------------------------------------------------- Norma Carroll Norma PARAS ([id number]) [phone].pdf Page 7 of 9 Patient Name: Date of Service: JENEA, DAKE [place] 1/10/2025andnbsp8:00 A M Medical Record Number: [id number] Patient Account Number: [id number] Date of Birth/Gender: Treating RN: [birth year] (52 y.o. Norma Alverta Elenor Primary Care Physician: Johnson Jacobsen Other Clinician: Referring Physician: Treating Physician/Extender: Bethena Andre Johnson Jacobsen Devra in Treatment: 3 Education Assessment Education Provided To: Patient Education Topics Provided Wound/Skin Impairment: Handouts: Caring for Your Ulcer Methods: Explain/Verbal Responses: State content correctly Electronic Signature(s) Signed: 11/22/2023 8:27:37 AM By: Alverta Elenor RN Entered By: Alverta Elenor on 11/22/2023 08:13:16 -------------------------------------------------------------------------------- Wound Assessment Details Patient Name: Date of Service: Norma Carroll MEIERS NA Carroll. 11/22/2023 8:00 A M Medical Record Number: [id number] Patient Account Number: [id number] Date of Birth/Sex: Treating RN: [birth year] (53 y.o. Norma Alverta Elenor Primary Care Chloris Marcoux: Johnson Jacobsen Other Clinician: Referring Autumm Hattery: Treating Kinesha Auten/Extender: Bethena Andre Johnson Jacobsen Weeks in Treatment: 3 Wound Status Wound Number: 1 Primary Etiology: Diabetic Wound/Ulcer of the Lower Extremity Wound Location: Left, Lateral Ankle Wound Status: Open Wounding Event: Trauma Comorbid History: Arrhythmia, Hypertension, Type II Diabetes Date Acquired: 07/01/2023 Weeks Of Treatment: 3 Clustered Wound: No Photos Wound Measurements Length: (cm) 2.3 Width: (cm) 2 Depth: (cm) 0.2 Hagberg, Norma Carroll ([id number]) Area: (cm) Volume: (cm) % Reduction in Area: 26.4% %  Reduction in Volume: 26.4% Epithelialization: None [id number].pdf Page 8 of 9 3.613 Tunneling:  No 0.723 Undermining: No Wound Description Classification: Grade 1 Exudate Amount: Medium Exudate Type: Serosanguineous Exudate Color: red, brown Foul Odor After Cleansing: No Slough/Fibrino Yes Wound Bed Granulation Amount: Medium (34-66%) Exposed Structure Granulation Quality: Red Fascia Exposed: No Necrotic Amount: Medium (34-66%) Fat Layer (Subcutaneous Tissue) Exposed: Yes Necrotic Quality: Adherent Slough Tendon Exposed: No Muscle Exposed: No Joint Exposed: No Bone Exposed: No Treatment Notes Wound #1 (Ankle) Wound Laterality: Left, Lateral Cleanser Soap and Water Discharge Instruction: Gently cleanse wound with antibacterial soap, rinse and pat dry prior to dressing wounds Peri-Wound Care Topical Primary Dressing Promogran Matrix 4.34 (in) Discharge Instruction: Moisten w/normal saline or sterile water; Cover wound as directed. Do not remove from wound bed. Secondary Dressing Zetuvit Plus 4x4 (in/in) Secured With Compression Wrap Compression Stockings Add-Ons Electronic Signature(s) Signed: 11/22/2023 8:27:37 AM By: Alverta Sailors RN Entered By: Alverta Carroll on 11/22/2023 08:11:31 -------------------------------------------------------------------------------- Vitals Details Patient Name: Date of Service: Norma Carroll, Norma Carroll. 11/22/2023 8:00 A M Medical Record Number: [id number] Patient Account Number: [id number] Date of Birth/Sex: Treating RN: [birth year] (52 y.o. Norma Carroll Primary Care Stephnie Parlier: Johnson Jacobsen Other Clinician: Referring Gera Inboden: Treating Fanchon Papania/Extender: Bethena Andre Johnson Jacobsen Weeks in Treatment: 3 Vital Signs Time Taken: 08:08 Temperature (F): 97.8 Height (in): 70 Pulse (bpm): 57 Weight (lbs): 210 Respiratory Rate (breaths/min): 18 AVAREY, YAEGER Carroll ([id number]) [phone].pdf Page 9 of 9 Body Mass Index (BMI): 30.1 Blood Pressure (mmHg): 172/92 Reference Range: 80 - 120 mg / dl Electronic  Signature(s) Signed: 11/22/2023 8:27:37 AM By: Alverta Sailors RN Entered By: Alverta Carroll on 11/22/2023 08:08:47

## 2023-11-25 ENCOUNTER — Other Ambulatory Visit (HOSPITAL_COMMUNITY)
Admission: RE | Admit: 2023-11-25 | Discharge: 2023-11-25 | Disposition: A | Discharge status: Home / Self Care | Payer: Medicaid Other | Source: Ambulatory Visit | Attending: Internal Medicine | Admitting: Internal Medicine

## 2023-11-25 DIAGNOSIS — I4892 Unspecified atrial flutter: Secondary | ICD-10-CM | POA: Diagnosis present

## 2023-11-25 LAB — CBC
HCT: 33.1 % — ABNORMAL LOW (ref 36.0–46.0)
Hemoglobin: 9.7 g/dL — ABNORMAL LOW (ref 12.0–15.0)
MCH: 22.1 pg — ABNORMAL LOW (ref 26.0–34.0)
MCHC: 29.3 g/dL — ABNORMAL LOW (ref 30.0–36.0)
MCV: 75.6 fL — ABNORMAL LOW (ref 80.0–100.0)
Platelets: 322 10*3/uL (ref 150–400)
RBC: 4.38 MIL/uL (ref 3.87–5.11)
RDW: 19.5 % — ABNORMAL HIGH (ref 11.5–15.5)
WBC: 8.2 10*3/uL (ref 4.0–10.5)
nRBC: 0 % (ref 0.0–0.2)

## 2023-11-25 LAB — BASIC METABOLIC PANEL
Anion gap: 6 (ref 5–15)
BUN: 27 mg/dL — ABNORMAL HIGH (ref 6–20)
CO2: 21 mmol/L — ABNORMAL LOW (ref 22–32)
Calcium: 9 mg/dL (ref 8.9–10.3)
Chloride: 109 mmol/L (ref 98–111)
Creatinine, Ser: 0.86 mg/dL (ref 0.44–1.00)
GFR, Estimated: >60 mL/min (ref >60)
Glucose, Bld: 104 mg/dL — ABNORMAL HIGH (ref 70–99)
Potassium: 4 mmol/L (ref 3.5–5.1)
Sodium: 136 mmol/L (ref 135–145)

## 2023-11-29 ENCOUNTER — Encounter: Payer: Medicaid Other | Admitting: Physician Assistant

## 2023-11-29 DIAGNOSIS — E11621 Type 2 diabetes mellitus with foot ulcer: Secondary | ICD-10-CM | POA: Diagnosis not present

## 2023-11-29 NOTE — Progress Notes (Addendum)
EZARIA, MELTZER ([id number]) [id number] Page 1 of 7 Visit Report for 11/29/2023 Chief Complaint Document Details Patient Name: Date of Service: Norma Carroll, Norma Carroll [place] J. 11/29/2023 8:00 A M Medical Record Number: [id number] Patient Account Number: [id number] Date of Birth/Sex: Treating RN: October 07, 1971 (53 y.o. Freddy Finner Primary Care Provider: Cheron Every Other Clinician: Referring Provider: Treating Provider/Extender: Maren Beach Weeks in Treatment: 4 Information Obtained from: Patient Chief Complaint Left lateral ankle ulcer Electronic Signature(s) Signed: 11/29/2023 8:12:20 AM By: Allen Derry PA-C Entered By: Allen Derry on 11/29/2023 08:12:20 -------------------------------------------------------------------------------- HPI Details Patient Name: Date of Service: Norma Carroll, [name] 11/29/2023 8:00 A M Medical Record Number: [id number] Patient Account Number: [id number] Date of Birth/Sex: Treating RN: 02/26/1971 (53 y.o. Freddy Finner Primary Care Provider: Cheron Every Other Clinician: Referring Provider: Treating Provider/Extender: Maren Beach Weeks in Treatment: 4 History of Present Illness Chronic/Inactive Conditions Condition 1: 11-01-2023 on evaluation today patient's ABIs were noncompressible here in the clinic this could be due to the fact that her blood pressure is so high she seems to have a good pulse and good capillary refill. With that being said she also has a history of when she had the initial trauma this really did not bleed much. That is a little strange and I would like to further evaluate her arterial flow we will get a send her for formal testing. HPI Description: 11-01-2023 upon evaluation today patient presents today for a wound on left lateral ankle that occurred on July 01, 2023. She tells me that she hit this on the edge of a piece of furniture and it tore away a piece of tissue. Since  that time she has been seen in urgent care where they gave her antibiotic she is also been seen by PCP once but they really have not recommend anything from a wound care perspective for her at this point. Fortunately I do not see any signs of active infection at this time which is good with that being said there is a lot of necrotic tissue we will definitely have to perform some debridement to clear away this necrotic debris. She voiced understanding. Patient does have a history of hypertension, atrial flutter for which she is gena be undergoing an ablation on January 16 and for which she is currently on Eliquis. She also has a history of diabetes mellitus type 2. Her most recent hemoglobin A1c was 7.4 on August 23, 2023. 11-29-2023 upon evaluation today patient appears to be doing well in general although unfortunately she is having some issues with her wound from a pain standpoint. She tells me that it has been increased as far as the pain is concerned increased as far as the drainage is concerned and she does have some warmth without erythema on the proximal portion of the periwound. All this has me concerned about a subacute infection. MACAULEY, BICK ([id number]) [id number] Page 2 of 7 Electronic Signature(s) Signed: 11/29/2023 8:34:09 AM By: Allen Derry PA-C Entered By: Allen Derry on 11/29/2023 08:34:09 -------------------------------------------------------------------------------- Physical Exam Details Patient Name: Date of Service: Norma Carroll, [name] 11/29/2023 8:00 A M Medical Record Number: [id number] Patient Account Number: [id number] Date of Birth/Sex: Treating RN: [birth year] (53 y.o. Freddy Finner Primary Care Provider: Cheron Every Other Clinician: Referring Provider: Treating Provider/Extender: Maren Beach Weeks in Treatment: 4 Constitutional Well-nourished and well-hydrated in no acute distress. Respiratory normal breathing  without difficulty. Psychiatric this patient is able to make decisions and demonstrates good insight  into disease process. Alert and Oriented x 3. pleasant and cooperative. Notes Upon inspection patient's wound bed actually showed signs of good granulation and epithelization at this point. Fortunately I do not see any evidence of worsening overall and I believe that the patient is making headway here towards closure. Nonetheless I think right now there may be some subacute infection setting and I think we need to address this as quickly as possible. Electronic Signature(s) Signed: 11/29/2023 8:34:37 AM By: Allen Derry PA-C Entered By: Allen Derry on 11/29/2023 08:34:37 -------------------------------------------------------------------------------- Physician Orders Details Patient Name: Date of Service: Norma Carroll, [name] 11/29/2023 8:00 A M Medical Record Number: [id number] Patient Account Number: [id number] Date of Birth/Sex: Treating RN: [birth year] (53 y.o. Freddy Finner Primary Care Provider: Cheron Every Other Clinician: Referring Provider: Treating Provider/Extender: Maren Beach Weeks in Treatment: 4 The following information was scribed by: Yevonne Pax The information was scribed for: Allen Derry Verbal / Phone Orders: No Diagnosis Coding ICD-10 Coding Norma, Carroll ([id number]) [phone].pdf Page 3 of 7 Code Description E11.622 Type 2 diabetes mellitus with other skin ulcer L97.322 Non-pressure chronic ulcer of left ankle with fat layer exposed I10 Essential (primary) hypertension I48.92 Unspecified atrial flutter Z79.01 Long term (current) use of anticoagulants Follow-up Appointments Return Appointment in 1 week. Bathing/ Shower/ Hygiene May shower; gently cleanse wound with antibacterial soap, rinse and pat dry prior to dressing wounds Anesthetic (Use 'Patient Medications' Section for Anesthetic Order Entry) Lidocaine  applied to wound bed Edema Control - Orders / Instructions Elevate, Exercise Daily and A void Standing for Long Periods of Time. Elevate legs to the level of the heart and pump ankles as often as possible Elevate leg(s) parallel to the floor when sitting. Wound Treatment Wound #1 - Ankle Wound Laterality: Left, Lateral Cleanser: Soap and Water 3 x Per Week/30 Days Discharge Instructions: Gently cleanse wound with antibacterial soap, rinse and pat dry prior to dressing wounds Prim Dressing: Promogran Matrix 4.34 (in) 3 x Per Week/30 Days ary Discharge Instructions: Moisten w/normal saline or sterile water; Cover wound as directed. Do not remove from wound bed. Secondary Dressing: Zetuvit Plus 4x4 (in/in) 3 x Per Week/30 Days Laboratory Bacteria identified in Wound by Culture (MICRO) - increased drainage, increased pain, increased warmth - (ICD10 L97.322 - Non-pressure chronic ulcer of left ankle with fat layer exposed) LOINC Code: 6462-6 Convenience Name: Wound culture routine Patient Medications llergies: No Known Allergies A Notifications Medication Indication Start End 11/29/2023 doxycycline hyclate DOSE 1 - oral 100 mg capsule - 1 capsule oral twice a day x 14 days Electronic Signature(s) Signed: 11/29/2023 8:36:29 AM By: Allen Derry PA-C Entered By: Allen Derry on 11/29/2023 08:36:28 -------------------------------------------------------------------------------- Problem List Details Patient Name: Date of Service: [place], [name] 11/29/2023 8:00 A M Medical Record Number: [id number] Patient Account Number: [id number] Date of Birth/Sex: Treating RN: October 03, 1971 (53 y.o. Freddy Finner Primary Care Provider: Cheron Every Other Clinician: Referring Provider: Treating Provider/Extender: Maren Beach Weeks in Treatment: [address], [place] J ([id number]) [id number] Page 4 of 7 Active Problems ICD-10 Encounter Code Description Active  Date MDM Diagnosis E11.622 Type 2 diabetes mellitus with other skin ulcer 11/01/2023 No Yes L97.322 Non-pressure chronic ulcer of left ankle with fat layer exposed 11/01/2023 No Yes I10 Essential (primary) hypertension 11/01/2023 No Yes I48.92 Unspecified atrial flutter 11/01/2023 No Yes Z79.01 Long term (current) use of anticoagulants 11/01/2023 No Yes Inactive Problems Resolved Problems Electronic Signature(s) Signed: 11/29/2023 8:12:17 AM By: Allen Derry PA-C Entered  By: Allen Derry on 11/29/2023 08:12:17 -------------------------------------------------------------------------------- Progress Note Details Patient Name: Date of Service: ILITHYIA, WILLMON NA J. 11/29/2023 8:00 A M Medical Record Number: [id number] Patient Account Number: [id number] Date of Birth/Sex: Treating RN: [birth year] (53 y.o. Freddy Finner Primary Care Provider: Cheron Every Other Clinician: Referring Provider: Treating Provider/Extender: Maren Beach Weeks in Treatment: 4 Subjective Chief Complaint Information obtained from Patient Left lateral ankle ulcer History of Present Illness (HPI) Chronic/Inactive Condition: 11-01-2023 on evaluation today patient's ABIs were noncompressible here in the clinic this could be due to the fact that her blood pressure is so high she seems to have a good pulse and good capillary refill. With that being said she also has a history of when she had the initial trauma this really did not bleed much. That is a little strange and I would like to further evaluate her arterial flow we will get a send her for formal testing. 11-01-2023 upon evaluation today patient presents today for a wound on left lateral ankle that occurred on July 01, 2023. She tells me that she hit this on the edge of a piece of furniture and it tore away a piece of tissue. Since that time she has been seen in urgent care where they gave her antibiotic she is also been seen by PCP once but they  really have not recommend anything from a wound care perspective for her at this point. Fortunately I do not see any signs of active infection at this time which is good with that being said there is a lot of necrotic tissue we will definitely have to perform some debridement to clear away this necrotic debris. She voiced understanding. LOETTA, ALBAREZ ([id number]) [id number] Page 5 of 7 Patient does have a history of hypertension, atrial flutter for which she is gena be undergoing an ablation on January 16 and for which she is currently on Eliquis. She also has a history of diabetes mellitus type 2. Her most recent hemoglobin A1c was 7.4 on August 23, 2023. 11-29-2023 upon evaluation today patient appears to be doing well in general although unfortunately she is having some issues with her wound from a pain standpoint. She tells me that it has been increased as far as the pain is concerned increased as far as the drainage is concerned and she does have some warmth without erythema on the proximal portion of the periwound. All this has me concerned about a subacute infection. Objective Constitutional Well-nourished and well-hydrated in no acute distress. Vitals Time Taken: 8:06 AM, Height: 70 in, Weight: 210 lbs, BMI: 30.1, Temperature: 97.6 F, Pulse: 64 bpm, Respiratory Rate: 16 breaths/min, Blood Pressure: 168/84 mmHg. Respiratory normal breathing without difficulty. Psychiatric this patient is able to make decisions and demonstrates good insight into disease process. Alert and Oriented x 3. pleasant and cooperative. General Notes: Upon inspection patient's wound bed actually showed signs of good granulation and epithelization at this point. Fortunately I do not see any evidence of worsening overall and I believe that the patient is making headway here towards closure. Nonetheless I think right now there may be some subacute infection setting and I think we need to  address this as quickly as possible. Integumentary (Hair, Skin) Wound #1 status is Open. Original cause of wound was Trauma. The date acquired was: 07/01/2023. The wound has been in treatment 4 weeks. The wound is located on the Left,Lateral Ankle. The wound measures 2.1cm length x 1cm width x 0.2cm depth; 1.649cm^2 area and  0.33cm^3 volume. There is Fat Layer (Subcutaneous Tissue) exposed. There is no tunneling or undermining noted. There is a medium amount of serosanguineous drainage noted. There is medium (34-66%) red granulation within the wound bed. There is a medium (34-66%) amount of necrotic tissue within the wound bed including Adherent Slough. Assessment Active Problems ICD-10 Type 2 diabetes mellitus with other skin ulcer Non-pressure chronic ulcer of left ankle with fat layer exposed Essential (primary) hypertension Unspecified atrial flutter Long term (current) use of anticoagulants Plan Follow-up Appointments: Return Appointment in 1 week. Bathing/ Shower/ Hygiene: May shower; gently cleanse wound with antibacterial soap, rinse and pat dry prior to dressing wounds Anesthetic (Use 'Patient Medications' Section for Anesthetic Order Entry): Lidocaine applied to wound bed Edema Control - Orders / Instructions: Elevate, Exercise Daily and Avoid Standing for Long Periods of Time. Elevate legs to the level of the heart and pump ankles as often as possible Elevate leg(s) parallel to the floor when sitting. Laboratory ordered were: Wound culture routine - increased drainage, increased pain, increased warmth The following medication(s) was prescribed: doxycycline hyclate oral 100 mg capsule 1 1 capsule oral twice a day x 14 days starting 11/29/2023 WOUND #1: - Ankle Wound Laterality: Left, Lateral Cleanser: Soap and Water 3 x Per Week/30 Days Discharge Instructions: Gently cleanse wound with antibacterial soap, rinse and pat dry prior to dressing wounds Prim Dressing: Promogran  Matrix 4.34 (in) 3 x Per Week/30 Days ary Discharge Instructions: Moisten w/normal saline or sterile water; Cover wound as directed. Do not remove from wound bed. Secondary Dressing: Zetuvit Plus 4x4 (in/in) 3 x Per Week/30 Days 1. I am going to recommend that we go ahead and get a prescription sent in for doxycycline for the patient she is in agreement with the plan. ALLISHA, SIMONELLI ([id number]) [id number] Page 6 of 7 2. Also can recommend that she should continue to monitor for any signs of infection or worsening and if anything changes she knows to contact the office and let me know. 3. We will continue with the Promogran at this point. With that being said depending on the results of the culture and how things appear next week we may make a change we will just see where things stand at that point. 4. I did obtain a PCR culture today and we will get a see what the results of the show making any changes in therapy as needed once we get the results back of that culture. We will see patient back for reevaluation in 1 week here in the clinic. If anything worsens or changes patient will contact our office for additional recommendations. Electronic Signature(s) Signed: 11/29/2023 8:36:38 AM By: Allen Derry PA-C Entered By: Allen Derry on 11/29/2023 08:36:37 -------------------------------------------------------------------------------- SuperBill Details Patient Name: Date of Service: Eulah Pont, [name] 11/29/2023 Medical Record Number: [id number] Patient Account Number: [id number] Date of Birth/Sex: Treating RN: Dec 18, 1970 (53 y.o. Freddy Finner Primary Care Provider: Cheron Every Other Clinician: Referring Provider: Treating Provider/Extender: Maren Beach Weeks in Treatment: 4 Diagnosis Coding ICD-10 Codes Code Description E11.622 Type 2 diabetes mellitus with other skin ulcer L97.322 Non-pressure chronic ulcer of left ankle with fat layer  exposed I10 Essential (primary) hypertension I48.92 Unspecified atrial flutter Z79.01 Long term (current) use of anticoagulants Facility Procedures : CPT4 Code: [id number] Description: 30865 - WOUND CARE VISIT-LEV 2 EST PT Modifier: Quantity: 1 Physician Procedures : CPT4: Description Modifier Code [id number] - WC PHYS LEVEL 4 - EST PT ICD-10 Diagnosis Description E11.622 Type  2 diabetes mellitus with other skin ulcer L97.322 Non-pressure chronic ulcer of left ankle with fat layer exposed I10 Essential (primary)  hypertension I48.92 Unspecified atrial flutter Quantity: 1 : CPT4: X3244 Visit complexity inherent to [facility] w/medical care services that serve as the continuing focal point for ongoing care related to a patient's condition ICD-10 Diagnosis Description E11.622 Type 2 diabetes mellitus with other skin ulcer  L97.322 Non-pressure chronic ulcer of left ankle with fat layer exposed I10 Essential (primary) hypertension I48.92 Unspecified atrial flutter Quantity: 1 Electronic Signature(s) YEILYN, ROBERTA ([id number]) [id number] Page 7 of 7 Signed: 11/29/2023 8:37:09 AM By: Allen Derry PA-C Entered By: Allen Derry on 11/29/2023 08:37:09

## 2023-11-29 NOTE — Progress Notes (Signed)
DEMEISHA, BOTTARI ([id number]) [id number] Page 1 of 9 Visit Report for 11/29/2023 Arrival Information Details Patient Name: Date of Service: Norma Carroll, Norma Carroll [place] J. 11/29/2023 8:00 A M Medical Record Number: [id number] Patient Account Number: [id number] Date of Birth/Sex: Treating RN: [birth year] (53 y.o. Freddy Finner Primary Care Raiana Pharris: Cheron Every Other Clinician: Referring Nuvia Hileman: Treating Keller Bounds/Extender: Maren Beach Weeks in Treatment: 4 Visit Information History Since Last Visit Added or deleted any medications: No Patient Arrived: Ambulatory Any new allergies or adverse reactions: No Arrival Time: 08:06 Had a fall or experienced change in No Accompanied By: self activities of daily living that may affect Transfer Assistance: None risk of falls: Patient Identification Verified: Yes Signs or symptoms of abuse/neglect since last visito No Secondary Verification Process Completed: Yes Hospitalized since last visit: No Patient Requires Transmission-Based Precautions: No Implantable device outside of the clinic excluding No Patient Has Alerts: Yes cellular tissue based products placed in the center Patient Alerts: Patient on Blood Thinner since last visit: Diabetic Has Dressing in Place as Prescribed: Yes NON COMPRESSABLE Pain Present Now: Yes Electronic Signature(s) Signed: 11/29/2023 8:18:57 AM By: Yevonne Pax RN Entered By: Yevonne Pax on 11/29/2023 08:10:00 -------------------------------------------------------------------------------- Clinic Level of Care Assessment Details Patient Name: Date of Service: JONAI, RISTINE [place] J. 11/29/2023 8:00 A M Medical Record Number: [id number] Patient Account Number: [id number] Date of Birth/Sex: Treating RN: August 30, 1971 (53 y.o. Freddy Finner Primary Care Cherylann Hobday: Cheron Every Other Clinician: Referring Nguyen Todorov: Treating Sheera Illingworth/Extender: Maren Beach Weeks in  Treatment: 4 Clinic Level of Care Assessment Items TOOL 4 Quantity Score X- 1 0 Use when only an EandM is performed on FOLLOW-UP visit ASSESSMENTS - Nursing Assessment / Reassessment X- 1 10 Reassessment of Co-morbidities (includes updates in patient status) X- 1 5 Reassessment of Adherence to Treatment Plan Norma Carroll, Norma Carroll ([id number]) [phone].pdf Page 2 of 9 ASSESSMENTS - Wound and Skin A ssessment / Reassessment X - Simple Wound Assessment / Reassessment - one wound 1 5 []  - 0 Complex Wound Assessment / Reassessment - multiple wounds []  - 0 Dermatologic / Skin Assessment (not related to wound area) ASSESSMENTS - Focused Assessment []  - 0 Circumferential Edema Measurements - multi extremities []  - 0 Nutritional Assessment / Counseling / Intervention []  - 0 Lower Extremity Assessment (monofilament, tuning fork, pulses) []  - 0 Peripheral Arterial Disease Assessment (using hand held doppler) ASSESSMENTS - Ostomy and/or Continence Assessment and Care []  - 0 Incontinence Assessment and Management []  - 0 Ostomy Care Assessment and Management (repouching, etc.) PROCESS - Coordination of Care X - Simple Patient / Family Education for ongoing care 1 15 []  - 0 Complex (extensive) Patient / Family Education for ongoing care []  - 0 Staff obtains Chiropractor, Records, T Results / Process Orders est []  - 0 Staff telephones HHA, Nursing Homes / Clarify orders / etc []  - 0 Routine Transfer to another Facility (non-emergent condition) []  - 0 Routine Hospital Admission (non-emergent condition) []  - 0 New Admissions / Manufacturing engineer / Ordering NPWT Apligraf, etc. , []  - 0 Emergency Hospital Admission (emergent condition) X- 1 10 Simple Discharge Coordination []  - 0 Complex (extensive) Discharge Coordination PROCESS - Special Needs []  - 0 Pediatric / Minor Patient Management []  - 0 Isolation Patient Management []  - 0 Hearing / Language / Visual  special needs []  - 0 Assessment of Community assistance (transportation, D/C planning, etc.) []  - 0 Additional assistance / Altered mentation []  - 0 Support Surface(s) Assessment (bed, cushion, seat, etc.) INTERVENTIONS -  Wound Cleansing / Measurement X - Simple Wound Cleansing - one wound 1 5 []  - 0 Complex Wound Cleansing - multiple wounds X- 1 5 Wound Imaging (photographs - any number of wounds) []  - 0 Wound Tracing (instead of photographs) X- 1 5 Simple Wound Measurement - one wound []  - 0 Complex Wound Measurement - multiple wounds INTERVENTIONS - Wound Dressings X - Small Wound Dressing one or multiple wounds 1 10 []  - 0 Medium Wound Dressing one or multiple wounds []  - 0 Large Wound Dressing one or multiple wounds []  - 0 Application of Medications - topical []  - 0 Application of Medications - injection INTERVENTIONS - Miscellaneous []  - 0 External ear exam Norma Carroll, Norma Carroll ([id number]) [id number] Page 3 of 9 []  - 0 Specimen Collection (cultures, biopsies, blood, body fluids, etc.) []  - 0 Specimen(s) / Culture(s) sent or taken to Lab for analysis []  - 0 Patient Transfer (multiple staff / Michiel Sites Lift / Similar devices) []  - 0 Simple Staple / Suture removal (25 or less) []  - 0 Complex Staple / Suture removal (26 or more) []  - 0 Hypo / Hyperglycemic Management (close monitor of Blood Glucose) []  - 0 Ankle / Brachial Index (ABI) - do not check if billed separately X- 1 5 Vital Signs Has the patient been seen at the hospital within the last three years: Yes Total Score: 75 Level Of Care: New/Established - Level 2 Electronic Signature(s) Signed: 12/03/2023 9:06:19 AM By: Yevonne Pax RN Entered By: Yevonne Pax on 11/29/2023 08:29:02 -------------------------------------------------------------------------------- Encounter Discharge Information Details Patient Name: Date of Service: Norma Carroll, Norma NA J. 11/29/2023 8:00 A M Medical Record  Number: [id number] Patient Account Number: [id number] Date of Birth/Sex: Treating RN: Jul 19, 1971 (53 y.o. Freddy Finner Primary Care Neno Hohensee: Cheron Every Other Clinician: Referring Kyrstin Campillo: Treating Shealee Yordy/Extender: Illa Level in Treatment: 4 Encounter Discharge Information Items Discharge Condition: Stable Ambulatory Status: Ambulatory Discharge Destination: Home Transportation: Private Auto Accompanied By: self Schedule Follow-up Appointment: Yes Clinical Summary of Care: Electronic Signature(s) Signed: 12/03/2023 9:06:19 AM By: Yevonne Pax RN Entered By: Yevonne Pax on 11/29/2023 08:29:37 -------------------------------------------------------------------------------- Lower Extremity Assessment Details Patient Name: Date of Service: Norma Carroll, Norma NA J. 11/29/2023 8:00 A Wynn Banker, Katheran James ([id number]) [id number] Page 4 of 9 Medical Record Number: [id number] Patient Account Number: [id number] Date of Birth/Sex: Treating RN: Nov 06, 1971 (53 y.o. Freddy Finner Primary Care Kyonna Frier: Cheron Every Other Clinician: Referring Lorry Furber: Treating Jaydeen Odor/Extender: Maren Beach Weeks in Treatment: 4 Edema Assessment Assessed: [Left: No] [Right: No] [Left: Edema] [Right: :] Calf Left: Right: Point of Measurement: 38 cm From Medial Instep 39 cm Ankle Left: Right: Point of Measurement: 12 cm From Medial Instep 23.8 cm Knee To Floor Left: Right: From Medial Instep 48 cm Vascular Assessment Pulses: Dorsalis Pedis Palpable: [Left:Yes] Extremity colors, hair growth, and conditions: Extremity Color: [Left:Hyperpigmented] Hair Growth on Extremity: [Left:No] Temperature of Extremity: [Left:Warm] Capillary Refill: [Left:< 3 seconds] Dependent Rubor: [Left:No] Blanched when Elevated: [Left:No No] Toe Nail Assessment Left: Right: Thick: Yes Discolored: Yes Deformed: Yes Improper Length and Hygiene:  Yes Electronic Signature(s) Signed: 11/29/2023 8:18:57 AM By: Yevonne Pax RN Entered By: Yevonne Pax on 11/29/2023 08:11:38 -------------------------------------------------------------------------------- Multi Wound Chart Details Patient Name: Date of Service: Norma Carroll, Norma NA J. 11/29/2023 8:00 A M Medical Record Number: [id number] Patient Account Number: [id number] Date of Birth/Sex: Treating RN: 12-19-1970 (53 y.o. Freddy Finner Primary Care Emmersyn Kratzke: Cheron Every Other Clinician: Referring Roxane Puerto: Treating Lizbett Garciagarcia/Extender: Maren Beach Weeks in  Treatment: 4 Vital Signs Height(in): 70 Pulse(bpm): [address] J ([id number]) [id number] Page 5 of 9 Weight(lbs): 210 Blood Pressure(mmHg): 168/84 Body Mass Index(BMI): 30.1 Temperature(F): 97.6 Respiratory Rate(breaths/min): 16 [1:Photos:] [N/A:N/A] Left, Lateral Ankle N/A N/A Wound Location: Trauma N/A N/A Wounding Event: Diabetic Wound/Ulcer of the Lower N/A N/A Primary Etiology: Extremity Arrhythmia, Hypertension, Type II N/A N/A Comorbid History: Diabetes 07/01/2023 N/A N/A Date Acquired: 4 N/A N/A Weeks of Treatment: Open N/A N/A Wound Status: No N/A N/A Wound Recurrence: 2.1x1x0.2 N/A N/A Measurements L x W x D (cm) 1.649 N/A N/A A (cm) : rea 0.33 N/A N/A Volume (cm) : 66.40% N/A N/A % Reduction in A rea: 66.40% N/A N/A % Reduction in Volume: Grade 1 N/A N/A Classification: Medium N/A N/A Exudate A mount: Serosanguineous N/A N/A Exudate Type: red, brown N/A N/A Exudate Color: Medium (34-66%) N/A N/A Granulation A mount: Red N/A N/A Granulation Quality: Medium (34-66%) N/A N/A Necrotic A mount: Fat Layer (Subcutaneous Tissue): Yes N/A N/A Exposed Structures: Fascia: No Tendon: No Muscle: No Joint: No Bone: No None N/A N/A Epithelialization: Treatment Notes Electronic Signature(s) Signed: 11/29/2023 8:18:57 AM By: Yevonne Pax  RN Entered By: Yevonne Pax on 11/29/2023 08:11:42 -------------------------------------------------------------------------------- Multi-Disciplinary Care Plan Details Patient Name: Date of Service: Norma Carroll, Norma NA J. 11/29/2023 8:00 A M Medical Record Number: [id number] Patient Account Number: [id number] Date of Birth/Sex: Treating RN: 1971/06/29 (53 y.o. Freddy Finner Primary Care Nathasha Fiorillo: Cheron Every Other Clinician: Referring Jaylyne Breese: Treating Myleen Brailsford/Extender: Maren Beach Weeks in Treatment: [address] [place], [place] ([id number]) [id number] Page 6 of 9 Necrotic Tissue Nursing Diagnoses: Knowledge deficit related to management of necrotic/devitalized tissue Goals: Necrotic/devitalized tissue will be minimized in the wound bed Date Initiated: 11/01/2023 Target Resolution Date: 12/02/2023 Goal Status: Active Interventions: Assess patient pain level pre-, during and post procedure and prior to discharge Notes: Wound/Skin Impairment Nursing Diagnoses: Knowledge deficit related to ulceration/compromised skin integrity Goals: Patient/caregiver will verbalize understanding of skin care regimen Date Initiated: 11/01/2023 Target Resolution Date: 12/02/2023 Goal Status: Active Ulcer/skin breakdown will have a volume reduction of 30% by week 4 Date Initiated: 11/01/2023 Target Resolution Date: 12/02/2023 Goal Status: Active Ulcer/skin breakdown will have a volume reduction of 50% by week 8 Date Initiated: 11/01/2023 Target Resolution Date: 01/02/2024 Goal Status: Active Ulcer/skin breakdown will have a volume reduction of 80% by week 12 Date Initiated: 11/01/2023 Target Resolution Date: 01/30/2024 Goal Status: Active Ulcer/skin breakdown will heal within 14 weeks Date Initiated: 11/01/2023 Target Resolution Date: 03/01/2024 Goal Status: Active Interventions: Assess patient/caregiver ability to obtain necessary  supplies Assess patient/caregiver ability to perform ulcer/skin care regimen upon admission and as needed Assess ulceration(s) every visit Notes: Electronic Signature(s) Signed: 11/29/2023 8:18:57 AM By: Yevonne Pax RN Entered By: Yevonne Pax on 11/29/2023 08:11:53 -------------------------------------------------------------------------------- Pain Assessment Details Patient Name: Date of Service: Norma Chimera NA J. 11/29/2023 8:00 A M Medical Record Number: [id number] Patient Account Number: [id number] Date of Birth/Sex: Treating RN: 1971-05-15 (53 y.o. Freddy Finner Primary Care Kori Colin: Cheron Every Other Clinician: Referring Alex Leahy: Treating Chizara Mena/Extender: Maren Beach Weeks in Treatment: 4 Active Problems Location of Pain Severity and Description of Pain Patient Has Norma Carroll, Norma Carroll ([id number]) [id number].pdf Page 7 of 9 Patient Has Paino Yes Site Locations With Dressing Change: Yes Duration of the Pain. Constant / Intermittento Intermittent Rate the pain. Current Pain Level: 4 Worst Pain Level: 10 Least Pain Level: 0 Tolerable Pain Level: 5 Character of Pain Describe the Pain: Aching,  Burning, Throbbing Pain Management and Medication Current Pain Management: Medication: Yes Cold Application: No Rest: Yes Massage: No Activity: No T.E.N.S.: No Heat Application: No Leg drop or elevation: No Is the Current Pain Management Adequate: Inadequate How does your wound impact your activities of daily livingo Sleep: Yes Bathing: No Appetite: No Relationship With Others: No Bladder Continence: No Emotions: No Bowel Continence: No Work: No Toileting: No Drive: No Dressing: No Hobbies: No Electronic Signature(s) Signed: 11/29/2023 8:18:57 AM By: Yevonne Pax RN Entered By: Yevonne Pax on 11/29/2023 08:10:41 -------------------------------------------------------------------------------- Patient/Caregiver  Education Details Patient Name: Date of Service: Bonnell, Norma NA J. 1/17/2025andnbsp8:00 A M Medical Record Number: [id number] Patient Account Number: [id number] Date of Birth/Gender: Treating RN: [birth year] (53 y.o. Freddy Finner Primary Care Physician: Cheron Every Other Clinician: Referring Physician: Treating Physician/Extender: Illa Level in Treatment: 4 Education Assessment Education Provided To: Patient Education Topics Provided Wound/Skin ImpairmentGLENDI, Norma Carroll ([id number]) [id number] Page 8 of 9 Handouts: Caring for Your Ulcer Methods: Explain/Verbal Responses: State content correctly Electronic Signature(s) Signed: 11/29/2023 8:18:57 AM By: Yevonne Pax RN Entered By: Yevonne Pax on 11/29/2023 08:12:03 -------------------------------------------------------------------------------- Wound Assessment Details Patient Name: Date of Service: Norma Carroll, Norma NA J. 11/29/2023 8:00 A M Medical Record Number: [id number] Patient Account Number: [id number] Date of Birth/Sex: Treating RN: 09-25-1971 (53 y.o. Freddy Finner Primary Care Blue Winther: Cheron Every Other Clinician: Referring Emmali Karow: Treating Chantae Soo/Extender: Maren Beach Weeks in Treatment: 4 Wound Status Wound Number: 1 Primary Etiology: Diabetic Wound/Ulcer of the Lower Extremity Wound Location: Left, Lateral Ankle Wound Status: Open Wounding Event: Trauma Comorbid History: Arrhythmia, Hypertension, Type II Diabetes Date Acquired: 07/01/2023 Weeks Of Treatment: 4 Clustered Wound: No Photos Wound Measurements Length: (cm) 2.1 Width: (cm) 1 Depth: (cm) 0.2 Area: (cm) 1.649 Volume: (cm) 0.33 % Reduction in Area: 66.4% % Reduction in Volume: 66.4% Epithelialization: None Tunneling: No Undermining: No Wound Description Classification: Grade 1 Exudate Amount: Medium Exudate Type: Serosanguineous Exudate Color: red, brown Foul  Odor After Cleansing: No Slough/Fibrino Yes Wound Bed Granulation Amount: Medium (34-66%) Exposed Structure Granulation Quality: Red Fascia Exposed: No Necrotic Amount: Medium (34-66%) Fat Layer (Subcutaneous Tissue) Exposed: Yes Necrotic Quality: Adherent Slough Tendon Exposed: No Muscle Exposed: No Norma Carroll, Norma Carroll ([id number]) [id number] Page 9 of 9 Joint Exposed: No Bone Exposed: No Treatment Notes Wound #1 (Ankle) Wound Laterality: Left, Lateral Cleanser Soap and Water Discharge Instruction: Gently cleanse wound with antibacterial soap, rinse and pat dry prior to dressing wounds Peri-Wound Care Topical Primary Dressing Promogran Matrix 4.34 (in) Discharge Instruction: Moisten w/normal saline or sterile water; Cover wound as directed. Do not remove from wound bed. Secondary Dressing Zetuvit Plus 4x4 (in/in) Secured With Compression Wrap Compression Stockings Add-Ons Electronic Signature(s) Signed: 11/29/2023 8:18:57 AM By: Yevonne Pax RN Entered By: Yevonne Pax on 11/29/2023 08:09:54 -------------------------------------------------------------------------------- Vitals Details Patient Name: Date of Service: Norma Carroll, Norma NA J. 11/29/2023 8:00 A M Medical Record Number: [id number] Patient Account Number: [id number] Date of Birth/Sex: Treating RN: 07/21/1971 (53 y.o. Freddy Finner Primary Care Tallie Dodds: Cheron Every Other Clinician: Referring Hayat Warbington: Treating Partick Musselman/Extender: Maren Beach Weeks in Treatment: 4 Vital Signs Time Taken: 08:06 Temperature (F): 97.6 Height (in): 70 Pulse (bpm): 64 Weight (lbs): 210 Respiratory Rate (breaths/min): 16 Body Mass Index (BMI): 30.1 Blood Pressure (mmHg): 168/84 Reference Range: 80 - 120 mg / dl Electronic Signature(s) Signed: 11/29/2023 8:18:57 AM By: Yevonne Pax RN Entered By: Yevonne Pax on 11/29/2023 08:06:55

## 2023-12-03 ENCOUNTER — Encounter (INDEPENDENT_AMBULATORY_CARE_PROVIDER_SITE_OTHER): Payer: Self-pay

## 2023-12-05 ENCOUNTER — Encounter (INDEPENDENT_AMBULATORY_CARE_PROVIDER_SITE_OTHER): Payer: Self-pay

## 2023-12-05 ENCOUNTER — Other Ambulatory Visit (INDEPENDENT_AMBULATORY_CARE_PROVIDER_SITE_OTHER): Payer: Self-pay | Admitting: Physician Assistant

## 2023-12-05 DIAGNOSIS — L97322 Non-pressure chronic ulcer of left ankle with fat layer exposed: Secondary | ICD-10-CM

## 2023-12-05 DIAGNOSIS — E11622 Type 2 diabetes mellitus with other skin ulcer: Secondary | ICD-10-CM

## 2023-12-06 ENCOUNTER — Encounter (HOSPITAL_COMMUNITY): Payer: Self-pay | Admitting: Hematology and Oncology

## 2023-12-06 ENCOUNTER — Ambulatory Visit (INDEPENDENT_AMBULATORY_CARE_PROVIDER_SITE_OTHER): Payer: Medicaid Other

## 2023-12-06 ENCOUNTER — Encounter: Payer: Medicaid Other | Admitting: Physician Assistant

## 2023-12-06 DIAGNOSIS — E11622 Type 2 diabetes mellitus with other skin ulcer: Secondary | ICD-10-CM

## 2023-12-06 DIAGNOSIS — E11621 Type 2 diabetes mellitus with foot ulcer: Secondary | ICD-10-CM | POA: Diagnosis not present

## 2023-12-06 DIAGNOSIS — L97322 Non-pressure chronic ulcer of left ankle with fat layer exposed: Secondary | ICD-10-CM

## 2023-12-06 DIAGNOSIS — L98499 Non-pressure chronic ulcer of skin of other sites with unspecified severity: Secondary | ICD-10-CM

## 2023-12-09 LAB — VAS US ABI WITH/WO TBI
Left ABI: 1.29
Right ABI: 1.24

## 2023-12-13 ENCOUNTER — Encounter: Payer: Medicaid Other | Admitting: Physician Assistant

## 2023-12-13 DIAGNOSIS — E11621 Type 2 diabetes mellitus with foot ulcer: Secondary | ICD-10-CM | POA: Diagnosis not present

## 2023-12-13 NOTE — Pre-Procedure Instructions (Signed)
Instructed patient on the following items: Arrival time 0515 Nothing to eat or drink after midnight No meds AM of procedure Responsible person to drive you home and stay with you for 24 hrs  Have you missed any doses of anti-coagulant Eliqius- takes twice a day, hasn't missed any doses.

## 2023-12-14 ENCOUNTER — Other Ambulatory Visit: Payer: Self-pay | Admitting: Student

## 2023-12-16 ENCOUNTER — Ambulatory Visit (HOSPITAL_COMMUNITY)
Admission: RE | Admit: 2023-12-16 | Discharge: 2023-12-16 | Disposition: A | Discharge status: Home / Self Care | Payer: Medicaid Other | Attending: Internal Medicine | Admitting: Internal Medicine

## 2023-12-16 ENCOUNTER — Other Ambulatory Visit: Payer: Self-pay

## 2023-12-16 ENCOUNTER — Ambulatory Visit (HOSPITAL_COMMUNITY)
Admission: RE | Disposition: A | Discharge status: Home / Self Care | Payer: Medicaid Other | Source: Home / Self Care | Attending: Internal Medicine

## 2023-12-16 ENCOUNTER — Encounter (HOSPITAL_COMMUNITY): Payer: Self-pay | Admitting: Internal Medicine

## 2023-12-16 DIAGNOSIS — Z79899 Other long term (current) drug therapy: Secondary | ICD-10-CM | POA: Insufficient documentation

## 2023-12-16 DIAGNOSIS — I4892 Unspecified atrial flutter: Secondary | ICD-10-CM | POA: Insufficient documentation

## 2023-12-16 DIAGNOSIS — I10 Essential (primary) hypertension: Secondary | ICD-10-CM | POA: Diagnosis not present

## 2023-12-16 DIAGNOSIS — Z7901 Long term (current) use of anticoagulants: Secondary | ICD-10-CM | POA: Insufficient documentation

## 2023-12-16 DIAGNOSIS — I4719 Other supraventricular tachycardia: Secondary | ICD-10-CM | POA: Diagnosis present

## 2023-12-16 DIAGNOSIS — E669 Obesity, unspecified: Secondary | ICD-10-CM | POA: Insufficient documentation

## 2023-12-16 DIAGNOSIS — Z7985 Long-term (current) use of injectable non-insulin antidiabetic drugs: Secondary | ICD-10-CM | POA: Diagnosis not present

## 2023-12-16 DIAGNOSIS — I471 Supraventricular tachycardia, unspecified: Secondary | ICD-10-CM

## 2023-12-16 DIAGNOSIS — E119 Type 2 diabetes mellitus without complications: Secondary | ICD-10-CM | POA: Diagnosis not present

## 2023-12-16 DIAGNOSIS — Z6831 Body mass index (BMI) 31.0-31.9, adult: Secondary | ICD-10-CM | POA: Diagnosis not present

## 2023-12-16 DIAGNOSIS — Z7984 Long term (current) use of oral hypoglycemic drugs: Secondary | ICD-10-CM | POA: Diagnosis not present

## 2023-12-16 HISTORY — PX: SVT ABLATION: EP1225

## 2023-12-16 LAB — GLUCOSE, CAPILLARY
Glucose-Capillary: 80 mg/dL (ref 70–99)
Glucose-Capillary: 69 mg/dL — ABNORMAL LOW (ref 70–99)
Glucose-Capillary: 71 mg/dL (ref 70–99)
Glucose-Capillary: 88 mg/dL (ref 70–99)

## 2023-12-16 LAB — POCT ACTIVATED CLOTTING TIME: Activated Clotting Time: 112 s

## 2023-12-16 SURGERY — SVT ABLATION

## 2023-12-16 MED ORDER — HEPARIN (PORCINE) IN NACL 1000-0.9 UT/500ML-% IV SOLN
INTRAVENOUS | Status: DC | PRN
  Administered 2023-12-16: 500 mL

## 2023-12-16 MED ORDER — ONDANSETRON HCL 4 MG/2ML IJ SOLN: 4.0000 mg | Freq: Four times a day (QID) | INTRAMUSCULAR | Status: DC | PRN

## 2023-12-16 MED ORDER — ISOPROTERENOL HCL 0.2 MG/ML IJ SOLN
INTRAMUSCULAR | Status: AC
  Filled 2023-12-16: qty 5

## 2023-12-16 MED ORDER — FENTANYL CITRATE (PF) 100 MCG/2ML IJ SOLN
INTRAMUSCULAR | Status: DC | PRN
  Administered 2023-12-16: 25 ug via INTRAVENOUS
  Administered 2023-12-16 (×3): 12.5 ug via INTRAVENOUS
  Administered 2023-12-16 (×2): 25 ug via INTRAVENOUS

## 2023-12-16 MED ORDER — SODIUM CHLORIDE 0.9% FLUSH: 3.0000 mL | INTRAVENOUS | Status: DC | PRN

## 2023-12-16 MED ORDER — ISOPROTERENOL HCL 0.2 MG/ML IJ SOLN
INTRAVENOUS | Status: DC | PRN
  Administered 2023-12-16: 4 ug/min via INTRAVENOUS

## 2023-12-16 MED ORDER — MIDAZOLAM HCL 5 MG/5ML IJ SOLN
INTRAMUSCULAR | Status: AC
  Filled 2023-12-16: qty 5

## 2023-12-16 MED ORDER — FENTANYL CITRATE (PF) 100 MCG/2ML IJ SOLN
INTRAMUSCULAR | Status: AC
  Filled 2023-12-16: qty 2

## 2023-12-16 MED ORDER — LIDOCAINE HCL 1 % IJ SOLN
INTRAMUSCULAR | Status: AC
  Filled 2023-12-16: qty 20

## 2023-12-16 MED ORDER — BUPIVACAINE HCL (PF) 0.25 % IJ SOLN
INTRAMUSCULAR | Status: AC
  Filled 2023-12-16: qty 30

## 2023-12-16 MED ORDER — ACETAMINOPHEN 325 MG PO TABS
650.0000 mg | ORAL_TABLET | ORAL | Status: DC | PRN
  Administered 2023-12-16: 650 mg via ORAL
  Filled 2023-12-16: qty 2

## 2023-12-16 MED ORDER — MIDAZOLAM HCL 5 MG/5ML IJ SOLN
INTRAMUSCULAR | Status: DC | PRN
  Administered 2023-12-16: 1 mg via INTRAVENOUS
  Administered 2023-12-16: 2 mg via INTRAVENOUS
  Administered 2023-12-16 (×2): 1 mg via INTRAVENOUS
  Administered 2023-12-16 (×2): 2 mg via INTRAVENOUS

## 2023-12-16 MED ORDER — SODIUM CHLORIDE 0.9% FLUSH
3.0000 mL | Freq: Two times a day (BID) | INTRAVENOUS | Status: DC
Start: 2023-12-16 — End: 2023-12-16

## 2023-12-16 MED ORDER — BUPIVACAINE HCL (PF) 0.25 % IJ SOLN
INTRAMUSCULAR | Status: DC | PRN
  Administered 2023-12-16: 30 mL

## 2023-12-16 MED ORDER — SODIUM CHLORIDE 0.9 % IV SOLN: INTRAVENOUS | Status: DC

## 2023-12-16 MED ORDER — SODIUM CHLORIDE 0.9 % IV SOLN: 250.0000 mL | INTRAVENOUS | Status: DC | PRN

## 2023-12-16 SURGICAL SUPPLY — 11 items
BAG SNAP BAND KOVER 36X36 (MISCELLANEOUS) IMPLANT
CATH EZ STEER NAV 4MM D-F CUR (ABLATOR) IMPLANT
CATH JOSEPH QUAD ALLRED 6F REP (CATHETERS) IMPLANT
CATH POLARIS X 2.5/5/2.5 DECAP (CATHETERS) IMPLANT
MAT PREVALON FULL STRYKER (MISCELLANEOUS) IMPLANT
PACK EP LF (CUSTOM PROCEDURE TRAY) ×1 IMPLANT
PAD DEFIB RADIO PHYSIO CONN (PAD) ×1 IMPLANT
PATCH CARTO3 (PAD) IMPLANT
SHEATH PINNACLE 6F 10CM (SHEATH) IMPLANT
SHEATH PINNACLE 7F 10CM (SHEATH) IMPLANT
SHEATH PINNACLE 8F 10CM (SHEATH) IMPLANT

## 2023-12-16 NOTE — Progress Notes (Signed)
Pt ambulated to and from bathroom to void with no signs of oozing from right groin site  

## 2023-12-16 NOTE — Progress Notes (Signed)
 Assumed care of pt at this time.

## 2023-12-16 NOTE — H&P (Signed)
HPI [name] is referred by Norma Carroll for evaluation of atrial flutter. She is a pleasant 53 yo woman with a h/o obesity, DM, and HTN. She presented with a RBB tachy over a month ago. She was treated with IV adenosine. Her rhythm was described as atrial flutter. The patient has never had syncope. She has been on amiodarone since and has not had any more additional symptoms since taking the amiodarone. She has been treated with eliquis and has not had any bleeding.  Allergies       Allergies  Allergen Reactions   Empagliflozin Other (See Comments)      Stomach pain                Current Outpatient Medications  Medication Sig Dispense Refill   amiodarone (PACERONE) 200 MG tablet Take 1 tablet (200 mg total) by mouth daily. 90 tablet 0   apixaban (ELIQUIS) 5 MG TABS tablet Take 1 tablet (5 mg total) by mouth 2 (two) times daily. 180 tablet 3   atorvastatin (LIPITOR) 20 MG tablet Take 1 tablet (20 mg total) by mouth daily. 30 tablet 1   diltiazem (CARDIZEM CD) 180 MG 24 hr capsule Take 1 capsule (180 mg total) by mouth daily. 90 capsule 3   lisinopril (ZESTRIL) 40 MG tablet Take 1 tablet (40 mg total) by mouth daily. 90 tablet 2   metFORMIN (GLUCOPHAGE) 1000 MG tablet Take 1,000 mg by mouth 2 (two) times daily.       OZEMPIC, 1 MG/DOSE, 4 MG/3ML SOPN Inject 1 mg into the skin once a week.          No current facility-administered medications for this visit.              Past Medical History:  Diagnosis Date   Anemia     Blood transfusion without reported diagnosis 2007    after gastric bypass surgery   Diabetes mellitus without complication (HCC) 2003   Hypertension 2008          ROS:    All systems reviewed and negative except as noted in the HPI.          Past Surgical History:  Procedure Laterality Date   CESAREAN SECTION   [phone] and 1997   LAPAROSCOPIC APPENDECTOMY N/A 04/01/2019    Procedure: APPENDECTOMY LAPAROSCOPIC;  Surgeon: Franky Macho,  MD;  Location: [facility];  Service: General;  Laterality: N/A;   LAPAROSCOPIC GASTRIC BYPASS   2007                 Family History  Problem Relation Age of Onset   Diabetes Mother     Heart attack Father          X 3; 31 STENTS   Hypertension Father     Diabetes Father     Prostate cancer Father 31        mets            Social History         Socioeconomic History   Marital status: Married      Spouse name: Not on file   Number of children: Not on file   Years of education: Not on file   Highest education level: Not on file  Occupational History   Not on file  Tobacco Use   Smoking status: Never   Smokeless tobacco: Never  Vaping Use   Vaping status: Never Used  Substance and Sexual Activity   Alcohol use: No   Drug use: No   Sexual activity: Yes  Other Topics Concern   Not on file  Social History Narrative   Not on file    Social Determinants of Health        Financial Resource Strain: Low Risk  (02/20/2021)    Overall Financial Resource Strain (CARDIA)     Difficulty of Paying Living Expenses: Not hard at all  Food Insecurity: No Food Insecurity (08/23/2023)    Hunger Vital Sign     Worried About Running Out of Food in the Last Year: Never true     Ran Out of Food in the Last Year: Never true  Transportation Needs: No Transportation Needs (08/23/2023)    PRAPARE - Therapist, art (Medical): No     Lack of Transportation (Non-Medical): No  Physical Activity: Inactive (02/20/2021)    Exercise Vital Sign     Days of Exercise per Week: 0 days     Minutes of Exercise per Session: 0 min  Stress: No Stress Concern Present (02/20/2021)    [employer] of Occupational Health - Occupational Stress Questionnaire     Feeling of Stress : Only a little  Social Connections: Socially Isolated (02/20/2021)    Social Connection and Isolation Panel [NHANES]     Frequency of Communication with Friends and Family: Never     Frequency of  Social Gatherings with Friends and Family: Never     Attends Religious Services: Never     Database administrator or Organizations: No     Attends Banker Meetings: Never     Marital Status: Married  Catering manager Violence: Not At Risk (08/23/2023)    Humiliation, Afraid, Rape, and Kick questionnaire     Fear of Current or Ex-Partner: No     Emotionally Abused: No     Physically Abused: No     Sexually Abused: No        BP (!) 148/72   Pulse 65   Ht 5\' 10"  (1.778 m)   Wt 218 lb (98.9 kg)   SpO2 98%   BMI 31.28 kg/m    Physical Exam:   Well appearing NAD HEENT: Unremarkable Neck:  No JVD, no thyromegally Lymphatics:  No adenopathy Back:  No CVA tenderness Lungs:  Clear HEART:  Regular rate rhythm, no murmurs, no rubs, no clicks Abd:  soft, positive bowel sounds, no organomegally, no rebound, no guarding Ext:  2 plus pulses, no edema, no cyanosis, no clubbing Skin:  No rashes no nodules Neuro:  CN II through XII intact, motor grossly intact   Assess/Plan: SVT/Atrial flutter - I have reviewed her ECG's and I am not convinced that the patient has atrial flutter. She would like to come off of her blood thinner if possible. I have offered her EP study and catheter ablation and she would like to proceed. I have discussed the risks/benefits of the procedure with the patient.    Norma Gowda Ashan Cueva,MD

## 2023-12-16 NOTE — Discharge Instructions (Signed)

## 2023-12-16 NOTE — Progress Notes (Signed)
Site area: R Groin (71fr, 7fr, 49fr, Venous x 3) Site Prior to Removal:  Level 0 Pressure Applied For: 20 Min Manual:   Yes Patient Status During Pull:  Stable Post Pull Site:  Level 0 Post Pull Instructions Given:  Yes Post Pull Pulses Present: Yes, R PT +1, L PT +2,  Dressing Applied:  Gauze & Tegaderm Bedrest begins @ 1050 Comments: Pt reports feeling well, denies pain on palpation of site, no distress during pull.  Pt verbalized understanding of follow up instruction, no questions.  Will continue to monitor.

## 2023-12-17 ENCOUNTER — Telehealth (HOSPITAL_COMMUNITY): Payer: Self-pay

## 2023-12-17 MED FILL — Lidocaine HCl Local Preservative Free (PF) Inj 1%: INTRAMUSCULAR | Qty: 30 | Status: AC

## 2023-12-17 NOTE — Telephone Encounter (Signed)
 Spoke with patient to complete post procedure follow up call.   Patient reports right groin puncture site as looking good. She denies any pain, bleeding or signs of infection.   Instructions reviewed:  Remove large bandage at puncture site after 24 hours. It is normal to have bruising, tenderness and a pea or marble sized lump/knot at the groin site which can take up to three months to resolve.  Get help right away if you notice sudden swelling at the puncture site.  Check your puncture site every day for signs of infection: fever, redness, swelling, pus drainage, warmth, foul odor or excessive pain. If this occurs, please call the office at [phone], to speak with the nurse.   Get help right away if your puncture site is bleeding and the bleeding does not stop after applying firm pressure to the area.  You may continue to have skipped beats/ atrial fibrillation during the first several months after your procedure.  It is very important not to miss any doses of your blood thinner Eliquis . Restart taking this medication on 12/17/2023. You are scheduled for a follow up appointment with Dr. Waddell on 01/20/24 at 3:15 PM.    Patient verbalized understanding to all instructions provided and appreciated the call.

## 2023-12-20 ENCOUNTER — Ambulatory Visit: Payer: Medicaid Other | Admitting: Physician Assistant

## 2023-12-26 ENCOUNTER — Encounter: Payer: Medicaid Other | Attending: Physician Assistant | Admitting: Physician Assistant

## 2023-12-26 DIAGNOSIS — E11622 Type 2 diabetes mellitus with other skin ulcer: Secondary | ICD-10-CM | POA: Diagnosis present

## 2023-12-26 DIAGNOSIS — I4892 Unspecified atrial flutter: Secondary | ICD-10-CM | POA: Insufficient documentation

## 2023-12-26 DIAGNOSIS — Z7901 Long term (current) use of anticoagulants: Secondary | ICD-10-CM | POA: Insufficient documentation

## 2023-12-26 DIAGNOSIS — L97322 Non-pressure chronic ulcer of left ankle with fat layer exposed: Secondary | ICD-10-CM | POA: Diagnosis not present

## 2023-12-26 DIAGNOSIS — I10 Essential (primary) hypertension: Secondary | ICD-10-CM | POA: Diagnosis not present

## 2023-12-30 ENCOUNTER — Ambulatory Visit: Payer: Medicaid Other | Admitting: Internal Medicine

## 2024-01-10 ENCOUNTER — Encounter: Payer: Medicaid Other | Admitting: Physician Assistant

## 2024-01-10 DIAGNOSIS — E11622 Type 2 diabetes mellitus with other skin ulcer: Secondary | ICD-10-CM | POA: Diagnosis not present

## 2024-01-20 ENCOUNTER — Encounter: Payer: Self-pay | Admitting: Internal Medicine

## 2024-01-20 ENCOUNTER — Ambulatory Visit: Payer: Medicaid Other | Attending: Internal Medicine | Admitting: Internal Medicine

## 2024-01-20 VITALS — BP 136/62 | HR 72 | Ht 70.0 in | Wt 204.0 lb

## 2024-01-20 DIAGNOSIS — I4892 Unspecified atrial flutter: Secondary | ICD-10-CM | POA: Diagnosis not present

## 2024-01-20 NOTE — Patient Instructions (Signed)
 Medication Instructions:  . Stop Taking Amiodarone   *If you need a refill on your cardiac medications before your next appointment, please call your pharmacy*   Lab Work: NONE   If you have labs (blood work) drawn today and your tests are completely normal, you will receive your results only by: MyChart Message (if you have MyChart) OR A paper copy in the mail If you have any lab test that is abnormal or we need to change your treatment, we will call you to review the results.   Testing/Procedures: NONE    Follow-Up: At [facility], you and your health needs are our priority.  As part of our continuing mission to provide you with exceptional heart care, we have created designated Provider Care Teams.  These Care Teams include your primary Cardiologist (physician) and Advanced Practice Providers (APPs -  Physician Assistants and Nurse Practitioners) who all work together to provide you with the care you need, when you need it.  We recommend signing up for the patient portal called "MyChart".  Sign up information is provided on this After Visit Summary.  MyChart is used to connect with patients for Virtual Visits (Telemedicine).  Patients are able to view lab/test results, encounter notes, upcoming appointments, etc.  Non-urgent messages can be sent to your provider as well.   To learn more about what you can do with MyChart, go to [email or web address].    Your next appointment:    As Needed   Provider:   [name], MD    Other Instructions Thank you for choosing [facility]!

## 2024-01-20 NOTE — Progress Notes (Signed)
 HPI Ms. Swayze returns today for followup. She is a pleasant 53 yo woman with SVT and atrial flutter who underwent EP study and catheter ablation of both several weeks ago. In the interim she has not had palpitations although she has had some episodes of sob. She has not checked her HR. She has continue amiodarone which was started over 4 months ago. Allergies  Allergen Reactions   Empagliflozin Other (See Comments)    Stomach pain Jandance     Current Outpatient Medications  Medication Sig Dispense Refill   amiodarone (PACERONE) 200 MG tablet Take 1 tablet by mouth once daily 90 tablet 0   apixaban (ELIQUIS) 5 MG TABS tablet Take 1 tablet (5 mg total) by mouth 2 (two) times daily. 180 tablet 3   lisinopril (ZESTRIL) 40 MG tablet Take 1 tablet (40 mg total) by mouth daily. 90 tablet 2   metFORMIN (GLUCOPHAGE) 1000 MG tablet Take 1,000 mg by mouth 2 (two) times daily.     OZEMPIC, 2 MG/DOSE, 8 MG/3ML SOPN Inject 2 mg into the skin once a week.     atorvastatin (LIPITOR) 20 MG tablet Take 1 tablet (20 mg total) by mouth daily. (Patient not taking: Reported on 12/12/2023) 30 tablet 1   diltiazem (CARDIZEM CD) 180 MG 24 hr capsule Take 1 capsule (180 mg total) by mouth daily. (Patient not taking: Reported on 01/20/2024) 90 capsule 3   No current facility-administered medications for this visit.     Past Medical History:  Diagnosis Date   Anemia    Blood transfusion without reported diagnosis 2007   after gastric bypass surgery   Diabetes mellitus without complication (HCC) 2003   Hypertension 2008    ROS:   All systems reviewed and negative except as noted in the HPI.   Past Surgical History:  Procedure Laterality Date   CESAREAN SECTION  [id number] and 1997   LAPAROSCOPIC APPENDECTOMY N/A 04/01/2019   Procedure: APPENDECTOMY LAPAROSCOPIC;  Surgeon: Franky Macho, MD;  Location: [facility];  Service: General;  Laterality: N/A;   LAPAROSCOPIC GASTRIC BYPASS  2007   SVT ABLATION  N/A 12/16/2023   Procedure: SVT ABLATION;  Surgeon: Marinus Maw, MD;  Location: [facility] INVASIVE [facility];  Service: Cardiovascular;  Laterality: N/A;     Family History  Problem Relation Age of Onset   Diabetes Mother    Heart attack Father        X 3; 47 STENTS   Hypertension Father    Diabetes Father    Prostate cancer Father 70       mets     Social History   Socioeconomic History   Marital status: Married    Spouse name: Not on file   Number of children: Not on file   Years of education: Not on file   Highest education level: Not on file  Occupational History   Not on file  Tobacco Use   Smoking status: Never   Smokeless tobacco: Never  Vaping Use   Vaping status: Never Used  Substance and Sexual Activity   Alcohol use: No   Drug use: No   Sexual activity: Yes  Other Topics Concern   Not on file  Social History Narrative   Not on file   Social Drivers of Health   Financial Resource Strain: Low Risk  (02/20/2021)   Overall Financial Resource Strain (CARDIA)    Difficulty of Paying Living Expenses: Not hard at all  Food Insecurity: No  Food Insecurity (08/23/2023)   Hunger Vital Sign    Worried About Running Out of Food in the Last Year: Never true    Ran Out of Food in the Last Year: Never true  Transportation Needs: No Transportation Needs (08/23/2023)   PRAPARE - Administrator, Civil Service (Medical): No    Lack of Transportation (Non-Medical): No  Physical Activity: Inactive (02/20/2021)   Exercise Vital Sign    Days of Exercise per Week: 0 days    Minutes of Exercise per Session: 0 min  Stress: No Stress Concern Present (02/20/2021)   [employer] of Occupational Health - Occupational Stress Questionnaire    Feeling of Stress : Only a little  Social Connections: Socially Isolated (02/20/2021)   Social Connection and Isolation Panel [NHANES]    Frequency of Communication with Friends and Family: Never    Frequency of Social Gatherings  with Friends and Family: Never    Attends Religious Services: Never    Database administrator or Organizations: No    Attends Banker Meetings: Never    Marital Status: Married  Catering manager Violence: Not At Risk (08/23/2023)   Humiliation, Afraid, Rape, and Kick questionnaire    Fear of Current or Ex-Partner: No    Emotionally Abused: No    Physically Abused: No    Sexually Abused: No     BP 136/62 (BP Location: Right Arm, Patient Position: Sitting, Cuff Size: Large)   Pulse 72   Ht 5\' 10"  (1.778 m)   Wt 204 lb (92.5 kg)   SpO2 98%   BMI 29.27 kg/m   Physical Exam:  Well appearing 53 yo woman, NAD HEENT: Unremarkable Neck:  No JVD, no thyromegally Lymphatics:  No adenopathy Back:  No CVA tenderness Lungs:  Clear with no wheezes HEART:  Regular rate rhythm, no murmurs, no rubs, no clicks Abd:  soft, positive bowel sounds, no organomegally, no rebound, no guarding Ext:  2 plus pulses, no edema, no cyanosis, no clubbing Skin:  No rashes no nodules Neuro:  CN II through XII intact, motor grossly intact  EKG - nsr with RBBB   Assess/Plan: SVT/Atrial flutter - she is s/p ablation of both. I have asked her to stop the amiodarone and undergo watchful waiting. I encouraged her to obtain either a Kardia mobile app or pulse ox.  Coags - she remains on eliquis. If no additional arrhythmias she could stop this medication in 7/25.   Sharlot Gowda Magaline Steinberg,MD

## 2024-01-24 ENCOUNTER — Encounter: Payer: Medicaid Other | Attending: Physician Assistant | Admitting: Physician Assistant

## 2024-01-24 DIAGNOSIS — I4892 Unspecified atrial flutter: Secondary | ICD-10-CM | POA: Insufficient documentation

## 2024-01-24 DIAGNOSIS — E11622 Type 2 diabetes mellitus with other skin ulcer: Secondary | ICD-10-CM | POA: Insufficient documentation

## 2024-01-24 DIAGNOSIS — L97822 Non-pressure chronic ulcer of other part of left lower leg with fat layer exposed: Secondary | ICD-10-CM | POA: Diagnosis not present

## 2024-01-24 DIAGNOSIS — X58XXXA Exposure to other specified factors, initial encounter: Secondary | ICD-10-CM | POA: Diagnosis not present

## 2024-01-24 DIAGNOSIS — I10 Essential (primary) hypertension: Secondary | ICD-10-CM | POA: Insufficient documentation

## 2024-01-24 DIAGNOSIS — S91002A Unspecified open wound, left ankle, initial encounter: Secondary | ICD-10-CM | POA: Insufficient documentation

## 2024-01-24 DIAGNOSIS — Z7901 Long term (current) use of anticoagulants: Secondary | ICD-10-CM | POA: Diagnosis not present

## 2024-02-07 ENCOUNTER — Encounter: Admitting: Internal Medicine

## 2024-02-07 DIAGNOSIS — S91002A Unspecified open wound, left ankle, initial encounter: Secondary | ICD-10-CM | POA: Diagnosis not present

## 2024-02-21 ENCOUNTER — Encounter: Attending: Physician Assistant | Admitting: Physician Assistant

## 2024-02-21 DIAGNOSIS — L97322 Non-pressure chronic ulcer of left ankle with fat layer exposed: Secondary | ICD-10-CM | POA: Diagnosis not present

## 2024-02-21 DIAGNOSIS — Z7901 Long term (current) use of anticoagulants: Secondary | ICD-10-CM | POA: Insufficient documentation

## 2024-02-21 DIAGNOSIS — E11622 Type 2 diabetes mellitus with other skin ulcer: Secondary | ICD-10-CM | POA: Diagnosis present

## 2024-02-21 DIAGNOSIS — I10 Essential (primary) hypertension: Secondary | ICD-10-CM | POA: Insufficient documentation

## 2024-02-21 DIAGNOSIS — I4892 Unspecified atrial flutter: Secondary | ICD-10-CM | POA: Insufficient documentation

## 2024-03-06 ENCOUNTER — Encounter: Admitting: Physician Assistant

## 2024-03-06 DIAGNOSIS — E11622 Type 2 diabetes mellitus with other skin ulcer: Secondary | ICD-10-CM | POA: Diagnosis not present

## 2024-03-13 ENCOUNTER — Encounter: Attending: Physician Assistant | Admitting: Physician Assistant

## 2024-03-13 DIAGNOSIS — Z7901 Long term (current) use of anticoagulants: Secondary | ICD-10-CM | POA: Insufficient documentation

## 2024-03-13 DIAGNOSIS — L97322 Non-pressure chronic ulcer of left ankle with fat layer exposed: Secondary | ICD-10-CM | POA: Diagnosis not present

## 2024-03-13 DIAGNOSIS — I10 Essential (primary) hypertension: Secondary | ICD-10-CM | POA: Diagnosis not present

## 2024-03-13 DIAGNOSIS — E11622 Type 2 diabetes mellitus with other skin ulcer: Secondary | ICD-10-CM | POA: Diagnosis present

## 2024-03-13 DIAGNOSIS — I4892 Unspecified atrial flutter: Secondary | ICD-10-CM | POA: Insufficient documentation

## 2024-03-20 ENCOUNTER — Encounter: Admitting: Physician Assistant

## 2024-03-20 DIAGNOSIS — E11622 Type 2 diabetes mellitus with other skin ulcer: Secondary | ICD-10-CM | POA: Diagnosis not present

## 2024-03-31 ENCOUNTER — Encounter (INDEPENDENT_AMBULATORY_CARE_PROVIDER_SITE_OTHER): Payer: Self-pay

## 2024-06-12 NOTE — Progress Notes (Signed)
 DS COLLECTED.

## 2024-07-15 NOTE — Progress Notes (Unsigned)
 Cardiology Office Note    Date:  07/16/2024  ID:  Evolet Salminen, DOB 09/09/1971, MRN [id number] Cardiologist: Diannah SHAUNNA Maywood, MD Cardiology APP:  Johnson Laymon HERO, PA-C  Electrophysiologist:  Danelle Birmingham, MD { :  History of Present Illness:    Norma Carroll is a 53 y.o. female with past medical history of HTN, Type II DM and atrial flutter/SVT who presents to the office today for follow-up.  She was last examined by Dr. Birmingham in 01/2024 and had undergone EP study and catheter ablation in 12/2023 with successful radiofrequency modification of the slow AV nodal pathway and atrial flutter isthmus. At her follow-up appointment, she denied any palpitations but did report some shortness of breath. She had still been on Amiodarone  and this was discontinued. She was on Eliquis  at that time and it was recommended she could stop the medication in 05/2024.  In talking with the patient today, she reports still having episodic dizziness at times and is unsure if this is due to her prior arrhythmias or anemia. She did have follow-up labs with her PCP yesterday. She has a history of significant iron  deficiency anemia in the setting of prior gastric bypass. She has started an OTC iron  supplement.  Reports having occasional palpitations but they only last for a few seconds and then spontaneously resolve. No persistent symptoms like she experienced last year. No recent chest pain, dyspnea on exertion, orthopnea, PND or pitting edema. Says she only consumes 1-2 bottles of water per week. Otherwise, consumes 2 sodas per day.  Studies Reviewed:   EKG: EKG is not ordered today.  Echocardiogram: 08/2023 IMPRESSIONS     1. Left ventricular ejection fraction, by estimation, is 60 to 65%. The  left ventricle has normal function. The left ventricle has no regional  wall motion abnormalities. There is mild left ventricular hypertrophy.  Left ventricular diastolic parameters  are  consistent with Grade II diastolic dysfunction (pseudonormalization).   2. Right ventricular systolic function is normal. The right ventricular  size is normal. Tricuspid regurgitation signal is inadequate for assessing  PA pressure.   3. Left atrial size was severely dilated.   4. Right atrial size was mild to moderately dilated.   5. The mitral valve is normal in structure. Mild mitral valve  regurgitation.   6. The aortic valve is tricuspid. Aortic valve regurgitation is not  visualized.   7. There is mild dilatation of the aortic root, measuring 41 mm.   8. The inferior vena cava is dilated in size with >50% respiratory  variability, suggesting right atrial pressure of 8 mmHg.   Comparison(s): No prior Echocardiogram.    Risk Assessment/Calculations:   CHA2DS2-VASc Score = 2  This indicates a 2.2% annual risk of stroke. The patient's score is based upon: CHF History: 0 HTN History: 1 Diabetes History: 0 Stroke History: 0 Vascular Disease History: 0 Age Score: 0 Gender Score: 1    Physical Exam:   VS:  BP 124/80 (BP Location: Right Arm, Cuff Size: Normal)   Pulse 62   Ht 5' 10 (1.778 m)   Wt 197 lb 9.6 oz (89.6 kg)   SpO2 99%   BMI 28.35 kg/m    Wt Readings from Last 3 Encounters:  07/16/24 197 lb 9.6 oz (89.6 kg)  01/20/24 204 lb (92.5 kg)  12/16/23 210 lb (95.3 kg)     GEN: Well nourished, well developed female appearing in no acute distress NECK: No JVD; No carotid bruits CARDIAC: RRR,  no murmurs, rubs, gallops RESPIRATORY:  Clear to auscultation without rales, wheezing or rhonchi  ABDOMEN: Appears non-distended. No obvious abdominal masses. EXTREMITIES: No clubbing or cyanosis. No pitting edema.  Distal pedal pulses are 2+ bilaterally.   Assessment and Plan:   1. Atrial Flutter/SVT - She previously underwent successful ablation of a slow AV nodal pathway and atrial flutter isthmus in 12/2023.  Reports occasional, brief palpitations but no persistent  symptoms. I encouraged her to make us  aware if symptoms increase in frequency or severity as we could place a Zio patch for further monitoring. We also reviewed the possibility of a Kardia monitor. She is no longer on AV nodal blocking agents. As discussed at the time of her last office visit with Dr. Waddell, will stop Eliquis . Reviewed the importance of reducing her caffeine intake and consuming more water.   2. Essential hypertension - BP is well-controlled at 124/80 during today's visit. Continue current medical therapy with Lisinopril  40 mg daily.  3. HLD - Followed by her PCP. We will request a copy of most recent labs. Reports she is no longer on Atorvastatin .  4. Anemia - She has a history of iron  deficiency anemia and likely due to prior gastric bypass. Ferritin was at 2 when checked in 2022. Hemoglobin was at 9.7 when checked in 11/2023 but she did have labs with her PCP yesterday and we will request a copy of these. She may benefit from Hematology referral if persistent anemia for consideration of iron  infusions.  Signed, Laymon CHRISTELLA Qua, PA-C

## 2024-07-16 ENCOUNTER — Encounter: Payer: Self-pay | Admitting: Student

## 2024-07-16 ENCOUNTER — Encounter: Payer: Self-pay | Admitting: *Deleted

## 2024-07-16 ENCOUNTER — Ambulatory Visit: Attending: Student | Admitting: Student

## 2024-07-16 VITALS — BP 124/80 | HR 62 | Ht 70.0 in | Wt 197.6 lb

## 2024-07-16 DIAGNOSIS — E782 Mixed hyperlipidemia: Secondary | ICD-10-CM | POA: Insufficient documentation

## 2024-07-16 DIAGNOSIS — I471 Supraventricular tachycardia, unspecified: Secondary | ICD-10-CM | POA: Insufficient documentation

## 2024-07-16 DIAGNOSIS — D508 Other iron deficiency anemias: Secondary | ICD-10-CM | POA: Diagnosis present

## 2024-07-16 DIAGNOSIS — I4892 Unspecified atrial flutter: Secondary | ICD-10-CM | POA: Diagnosis not present

## 2024-07-16 DIAGNOSIS — I10 Essential (primary) hypertension: Secondary | ICD-10-CM | POA: Diagnosis not present

## 2024-07-16 NOTE — Patient Instructions (Signed)
 Medication Instructions:   Stop Taking Eliquis    *If you need a refill on your cardiac medications before your next appointment, please call your pharmacy*  Lab Work: NONE   If you have labs (blood work) drawn today and your tests are completely normal, you will receive your results only by: MyChart Message (if you have MyChart) OR A paper copy in the mail If you have any lab test that is abnormal or we need to change your treatment, we will call you to review the results.  Testing/Procedures: NONE   Follow-Up: At [facility], you and your health needs are our priority.  As part of our continuing mission to provide you with exceptional heart care, our providers are all part of one team.  This team includes your primary Cardiologist (physician) and Advanced Practice Providers or APPs (Physician Assistants and Nurse Practitioners) who all work together to provide you with the care you need, when you need it.  Your next appointment:   6 month(s)  Provider:   Laymon Qua, PA-C    We recommend signing up for the patient portal called MyChart.  Sign up information is provided on this After Visit Summary.  MyChart is used to connect with patients for Virtual Visits (Telemedicine).  Patients are able to view lab/test results, encounter notes, upcoming appointments, etc.  Non-urgent messages can be sent to your provider as well.   To learn more about what you can do with MyChart, go to [email or web address].   Other Instructions Thank you for choosing [facility]!

## 2025-01-21 ENCOUNTER — Ambulatory Visit: Admitting: Student
